# Patient Record
Sex: Male | Born: 1935 | Race: White | Hispanic: No | Marital: Married | Smoking: Former smoker
Health system: Southern US, Community
[De-identification: ages and names within clinical notes are randomized; demographics above are authoritative.]

## PROBLEM LIST (undated history)

## (undated) DIAGNOSIS — M2550 Pain in unspecified joint: Secondary | ICD-10-CM

## (undated) DIAGNOSIS — F418 Other specified anxiety disorders: Secondary | ICD-10-CM

## (undated) DIAGNOSIS — E559 Vitamin D deficiency, unspecified: Secondary | ICD-10-CM

## (undated) DIAGNOSIS — K5900 Constipation, unspecified: Secondary | ICD-10-CM

## (undated) DIAGNOSIS — G2581 Restless legs syndrome: Secondary | ICD-10-CM

## (undated) DIAGNOSIS — E785 Hyperlipidemia, unspecified: Secondary | ICD-10-CM

## (undated) DIAGNOSIS — J309 Allergic rhinitis, unspecified: Secondary | ICD-10-CM

## (undated) DIAGNOSIS — I219 Acute myocardial infarction, unspecified: Secondary | ICD-10-CM

## (undated) DIAGNOSIS — R413 Other amnesia: Secondary | ICD-10-CM

## (undated) DIAGNOSIS — I2510 Atherosclerotic heart disease of native coronary artery without angina pectoris: Secondary | ICD-10-CM

## (undated) DIAGNOSIS — K219 Gastro-esophageal reflux disease without esophagitis: Secondary | ICD-10-CM

## (undated) DIAGNOSIS — M1990 Unspecified osteoarthritis, unspecified site: Secondary | ICD-10-CM

## (undated) DIAGNOSIS — E538 Deficiency of other specified B group vitamins: Secondary | ICD-10-CM

## (undated) DIAGNOSIS — Z8709 Personal history of other diseases of the respiratory system: Secondary | ICD-10-CM

## (undated) DIAGNOSIS — I10 Essential (primary) hypertension: Secondary | ICD-10-CM

## (undated) HISTORY — PX: NASAL SINUS SURGERY: SHX719

## (undated) HISTORY — DX: Other amnesia: R41.3

## (undated) HISTORY — DX: Vitamin D deficiency, unspecified: E55.9

## (undated) HISTORY — PX: CORONARY ANGIOPLASTY: SHX604

## (undated) HISTORY — DX: Atherosclerotic heart disease of native coronary artery without angina pectoris: I25.10

## (undated) HISTORY — DX: Essential (primary) hypertension: I10

## (undated) HISTORY — DX: Acute myocardial infarction, unspecified: I21.9

## (undated) HISTORY — PX: HAND SURGERY: SHX662

## (undated) HISTORY — DX: Restless legs syndrome: G25.81

## (undated) HISTORY — PX: ESOPHAGOGASTRODUODENOSCOPY: SHX1529

## (undated) HISTORY — DX: Deficiency of other specified B group vitamins: E53.8

## (undated) HISTORY — PX: HERNIA REPAIR: SHX51

## (undated) HISTORY — DX: Allergic rhinitis, unspecified: J30.9

## (undated) HISTORY — DX: Other specified anxiety disorders: F41.8

## (undated) HISTORY — PX: COLONOSCOPY: SHX174

## (undated) HISTORY — PX: OTHER SURGICAL HISTORY: SHX169

---

## 2002-02-05 HISTORY — PX: CARDIAC CATHETERIZATION: SHX172

## 2002-11-03 ENCOUNTER — Encounter: Payer: Self-pay | Admitting: Emergency Medicine

## 2002-11-03 ENCOUNTER — Inpatient Hospital Stay (HOSPITAL_COMMUNITY): Admission: EM | Admit: 2002-11-03 | Discharge: 2002-11-05 | Payer: Self-pay | Admitting: Emergency Medicine

## 2003-04-27 ENCOUNTER — Emergency Department (HOSPITAL_COMMUNITY): Admission: EM | Admit: 2003-04-27 | Discharge: 2003-04-27 | Payer: Self-pay | Admitting: Emergency Medicine

## 2003-09-02 ENCOUNTER — Ambulatory Visit (HOSPITAL_BASED_OUTPATIENT_CLINIC_OR_DEPARTMENT_OTHER): Admission: RE | Admit: 2003-09-02 | Discharge: 2003-09-02 | Payer: Self-pay | Admitting: Orthopedic Surgery

## 2003-09-02 ENCOUNTER — Encounter: Admission: RE | Admit: 2003-09-02 | Discharge: 2003-09-02 | Payer: Self-pay | Admitting: Orthopedic Surgery

## 2003-09-02 ENCOUNTER — Ambulatory Visit (HOSPITAL_COMMUNITY): Admission: RE | Admit: 2003-09-02 | Discharge: 2003-09-02 | Payer: Self-pay | Admitting: Orthopedic Surgery

## 2004-04-25 ENCOUNTER — Encounter: Admission: RE | Admit: 2004-04-25 | Discharge: 2004-04-25 | Payer: Self-pay | Admitting: Family Medicine

## 2004-10-19 ENCOUNTER — Ambulatory Visit (HOSPITAL_BASED_OUTPATIENT_CLINIC_OR_DEPARTMENT_OTHER): Admission: RE | Admit: 2004-10-19 | Discharge: 2004-10-19 | Payer: Self-pay | Admitting: Orthopedic Surgery

## 2004-11-07 ENCOUNTER — Encounter: Admission: RE | Admit: 2004-11-07 | Discharge: 2004-11-07 | Payer: Self-pay | Admitting: Family Medicine

## 2005-05-29 ENCOUNTER — Ambulatory Visit (HOSPITAL_COMMUNITY): Admission: RE | Admit: 2005-05-29 | Discharge: 2005-05-29 | Payer: Self-pay | Admitting: Cardiology

## 2005-08-01 ENCOUNTER — Ambulatory Visit (HOSPITAL_COMMUNITY): Admission: RE | Admit: 2005-08-01 | Discharge: 2005-08-01 | Payer: Self-pay | Admitting: Cardiology

## 2005-09-04 ENCOUNTER — Ambulatory Visit: Payer: Self-pay | Admitting: Pulmonary Disease

## 2005-09-11 ENCOUNTER — Ambulatory Visit: Admission: RE | Admit: 2005-09-11 | Discharge: 2005-09-11 | Payer: Self-pay | Admitting: Pulmonary Disease

## 2005-10-31 ENCOUNTER — Ambulatory Visit: Payer: Self-pay | Admitting: Pulmonary Disease

## 2005-12-06 ENCOUNTER — Ambulatory Visit: Payer: Self-pay | Admitting: Pulmonary Disease

## 2006-01-07 ENCOUNTER — Ambulatory Visit: Payer: Self-pay | Admitting: Pulmonary Disease

## 2006-07-10 ENCOUNTER — Ambulatory Visit: Payer: Self-pay | Admitting: Pulmonary Disease

## 2006-09-04 ENCOUNTER — Ambulatory Visit: Payer: Self-pay | Admitting: Internal Medicine

## 2006-09-05 ENCOUNTER — Ambulatory Visit: Payer: Self-pay | Admitting: Internal Medicine

## 2006-09-30 ENCOUNTER — Ambulatory Visit: Payer: Self-pay | Admitting: Internal Medicine

## 2007-01-13 DIAGNOSIS — R0602 Shortness of breath: Secondary | ICD-10-CM

## 2007-01-13 DIAGNOSIS — J449 Chronic obstructive pulmonary disease, unspecified: Secondary | ICD-10-CM

## 2007-01-13 DIAGNOSIS — J310 Chronic rhinitis: Secondary | ICD-10-CM

## 2007-01-13 DIAGNOSIS — K219 Gastro-esophageal reflux disease without esophagitis: Secondary | ICD-10-CM

## 2007-01-13 DIAGNOSIS — I2510 Atherosclerotic heart disease of native coronary artery without angina pectoris: Secondary | ICD-10-CM

## 2007-01-14 ENCOUNTER — Ambulatory Visit: Payer: Self-pay | Admitting: Internal Medicine

## 2007-01-14 DIAGNOSIS — R05 Cough: Secondary | ICD-10-CM

## 2007-03-18 ENCOUNTER — Encounter: Payer: Self-pay | Admitting: Internal Medicine

## 2007-03-21 ENCOUNTER — Ambulatory Visit: Payer: Self-pay | Admitting: Internal Medicine

## 2007-03-21 DIAGNOSIS — J309 Allergic rhinitis, unspecified: Secondary | ICD-10-CM | POA: Insufficient documentation

## 2007-03-21 DIAGNOSIS — I214 Non-ST elevation (NSTEMI) myocardial infarction: Secondary | ICD-10-CM

## 2007-03-21 DIAGNOSIS — E785 Hyperlipidemia, unspecified: Secondary | ICD-10-CM

## 2009-06-15 ENCOUNTER — Inpatient Hospital Stay (HOSPITAL_COMMUNITY): Admission: EM | Admit: 2009-06-15 | Discharge: 2009-06-21 | Payer: Self-pay | Admitting: Emergency Medicine

## 2009-06-18 ENCOUNTER — Ambulatory Visit: Payer: Self-pay | Admitting: Infectious Diseases

## 2009-06-21 ENCOUNTER — Encounter: Payer: Self-pay | Admitting: Infectious Disease

## 2009-06-23 ENCOUNTER — Encounter: Payer: Self-pay | Admitting: Infectious Diseases

## 2009-06-29 ENCOUNTER — Encounter: Payer: Self-pay | Admitting: Infectious Disease

## 2009-07-05 ENCOUNTER — Ambulatory Visit: Payer: Self-pay | Admitting: Infectious Diseases

## 2009-07-05 DIAGNOSIS — IMO0002 Reserved for concepts with insufficient information to code with codable children: Secondary | ICD-10-CM

## 2009-07-07 ENCOUNTER — Telehealth: Payer: Self-pay | Admitting: Infectious Diseases

## 2010-03-07 NOTE — Miscellaneous (Signed)
Summary: HIPAA Restrictions  HIPAA Restrictions   Imported By: Florinda Marker 07/06/2009 09:55:54  _____________________________________________________________________  External Attachment:    Type:   Image     Comment:   External Document

## 2010-03-07 NOTE — Assessment & Plan Note (Signed)
Summary: hsfu cat bite/need chart/kam   Vital Signs:  Patient profile:   77 year old male Height:      69 inches (175.26 cm) Weight:      180.8 pounds (82.18 kg) BMI:     26.80 Temp:     97.8 degrees F (36.56 degrees C) oral Pulse rate:   86 / minute BP sitting:   132 / 88  (right arm)  Vitals Entered By: Baxter Hire) (Jul 05, 2009 11:13 AM) CC: hsfu /  cat bite Pain Assessment Patient in pain? no      Nutritional Status BMI of 25 - 29 = overweight Nutritional Status Detail appetite is so-so per patient  Does patient need assistance? Functional Status Self care Ambulation Normal   Primary Provider:  [facility]  CC:  hsfu /  cat bite.  History of Present Illness: 43 yo wtih cat bite leading to aggresssive infection s/p I and D 5/13 by Dr Merlyn Lot.  Cxs negative and treated with zosyn and doxy since d/c 5/16.  Here for follow up. Doing well with wound healing with3x week hydrotherapy. no fevers chills.   Consult note This is a very pleasant 77 year old white   male with a history of coronary artery disease, hypertension, multiple   hand surgeries for Dupuytren contractures who was admitted on May 11th   one day following up bite by his cat Figaro to his right thumb.  He says   he was walking down the stairs with his cat when there was a feral cat   on the stairs.  The cats started to fight and he picked up Figaro, and   Figaro bit him inadvertently on his right thumb.  He applied Neosporin   and wrapped it for the night however by the next day there was   increasing redness streaking up his arm and increasing pain and   drainage.  He presented to the emergency room and was admitted to the   [facility].  He was seen by Dr. Merlyn Lot and was taken to the   OR May 12th where he had findings of tenosynovitis with purulence noted   from multiple sites.  He had extensive debridement and cultures were   sent and are negative to date.  He has had negative  blood cultures.  He   has been started on vancomycin and Unasyn.  However, he has a decreasing   white count and decreasing platelets so therefore I am concerned about   drug reaction.  He also has been persistently febrile to 102.9 despite   antibiotics.      Preventive Screening-Counseling & Management  Alcohol-Tobacco     Alcohol drinks/day: 0     Smoking Status: quit     Year Quit: 1966     Pack years: 15 years 4 packs daily  Caffeine-Diet-Exercise     Caffeine use/day: coffee     Does Patient Exercise: no  Safety-Violence-Falls     Seat Belt Use: yes  Problems Prior to Update: 1)  Hx of Myocardial Infarction  (ICD-410.90) 2)  Hyperlipidemia  (ICD-272.4) 3)  G E R D  (ICD-530.81) 4)  Coronary Heart Disease  (ICD-414.00) 5)  Allergic Rhinitis  (ICD-477.9) 6)  Cough  (ICD-786.2) 7)  Copd, Mild  (ICD-496) 8)  Hx of Potential Lung Nodule  () 9)  Hx of Decreased Diffusing Capacity  () 10)  Coronary Artery Disease  (ICD-414.00) 11)  Esophageal Reflux  (ICD-530.81) 12)  Chronic Rhinitis  (ICD-472.0) 13)  Dyspnea  (ICD-786.05)  Medications Prior to Update: 1)  Cozaar 50 Mg  Tabs (Losartan Potassium) .... Take 1 By Mouth Once Daily 2)  Protonix 40 Mg  Tbec (Pantoprazole Sodium) .... Take 1 By Mouth Two Times A Day 3)  Celebrex 200 Mg  Caps (Celecoxib) .... As Needed  Allergies (verified): No Known Drug Allergies  Past History:  Past Medical History: Last updated: 03/21/2007 COPD Allergic Rhinitis Coronary Heart Disease/ stent G E R D Hyperlipidemia Myocardial Infarction  Social History: Last updated: 03/21/2007 Patient states former smoker.   Risk Factors: Alcohol Use: 0 (07/05/2009) Caffeine Use: coffee (07/05/2009) Exercise: no (07/05/2009)  Risk Factors: Smoking Status: quit (07/05/2009)  Review of Systems       11 systems reviewed and negative except per HPI   Physical Exam  General:  alert and well-developed.   Eyes:  vision grossly intact  and pupils equal.   Mouth:  fair dentition.   Neck:  supple.   Lungs:  normal respiratory effort and normal breath sounds.   Heart:  normal rate and regular rhythm.   Abdomen:  soft, non-tender, and normal bowel sounds.   Msk:  normal ROM and no joint tenderness.   Extremities:  r upper extremity wiht much healed thumb with some peeling skin.  at his wrist on volar aspect the open incision has steristrips in place but no drainage or pus.    Neurologic:  alert & oriented X3 and cranial nerves II-XII intact.   Skin:  no rashes.  picc LUE Cervical Nodes:  no anterior cervical adenopathy and no posterior cervical adenopathy.   Psych:  Oriented X3 and memory intact for recent and remote.     Impression & Recommendations:  Problem # 1:  CELLULITIS AND ABSCESS OF UPPER ARM AND FOREARM (ICD-682.3) S/p surg by Dr Merlyn Lot. doing great.   will d/c zosyn and doxy and continue on augmentin for 1 more week.  Check esr and crp. If his wound is still open will continue another week.   He will continue hydrotherapy and follow with Dr Merlyn Lot  pcp Dr Thea Silversmith  His updated medication list for this problem includes:    Doxycycline Hyclate 100 Mg Tabs (Doxycycline hyclate) .[name]... Take one tablet by mouth twice a day.    Augmentin 875-125 Mg Tabs (Amoxicillin-pot clavulanate) ..... One by mouth two times a day  Orders: T-C-Reactive Protein [phone]) T-Sed Rate (Automated) [phone]) Est. Patient Level IV (30865)  Problem # 2:  CAT BITE (ICD-E906.3)  Orders: T-C-Reactive Protein ([id number]) T-Sed Rate (Automated) (84132-44010) Est. Patient Level IV (27253)  Medications Added to Medication List This Visit: 1)  Doxycycline Hyclate 100 Mg Tabs (Doxycycline hyclate) .... Take one tablet by mouth twice a day. 2)  Augmentin 875-125 Mg Tabs (Amoxicillin-pot clavulanate) .... One by mouth two times a day  Patient Instructions: 1)  Note to [facility] - Stop zosyn and pull picc after first  checking CRP and ESR.   2)  Stop doxycycline and start augmening 875 two times a day for 1-2 weeks (depending on how the wound).   3)  Call to be seen if worsening infection.  Prescriptions: AUGMENTIN 875-125 MG TABS (AMOXICILLIN-POT CLAVULANATE) one by mouth two times a day  #28 x 0   Entered and Authorized by:   Clydie Braun MD   Signed by:   Clydie Braun MD on 07/05/2009   Method used:   Electronically to  [facility] [phone]* (retail)       [address] [place], [place]  [address]       Ph: [phone]       Fax: [phone]   RxID:   [id number]

## 2010-03-07 NOTE — Letter (Signed)
Summary: [employer] Medicare Complete: RX  [employer] Medicare Complete: RX   Imported By: Florinda Marker 07/20/2009 15:19:26  _____________________________________________________________________  External Attachment:    Type:   Image     Comment:   External Document

## 2010-03-07 NOTE — Medication Information (Signed)
Summary: [facility]: Medications  [facility]: Medications   Imported By: Florinda Marker 07/01/2009 16:19:01  _____________________________________________________________________  External Attachment:    Type:   Image     Comment:   External Document

## 2010-03-07 NOTE — Progress Notes (Signed)
Summary[facility] Inst[facility]  [facility] Inst[facility]   Imported By: Florinda Marker 07/12/2009 10:03:52  _____________________________________________________________________  External Attachment:    Type:   Image     Comment:   External Document

## 2010-03-07 NOTE — Progress Notes (Signed)
Summary: plan care oversight  Phone Note Other Incoming   Summary of Call: [phone] mins) [phone] (30 or more mins) [phone](> 60 mins) I have supervised home care and/or infusion therapy for this pt, including providing orders for care, review of labs and/or home health care plans, communicating with the home health care professionals and/or patient/caregivers to integrate current information into the medical treatment plan and/or adjust the medical therapy. This supervision has been provided for 31 minutes during the calendar month. Dates for this oversight 5.25-6/30   Initial call taken by: Clydie Braun MD,  July 07, 2009 8:42 AM

## 2010-04-24 LAB — CBC
HCT: 30 % — ABNORMAL LOW (ref 39.0–52.0)
HCT: 34.9 % — ABNORMAL LOW (ref 39.0–52.0)
Hemoglobin: 10.5 g/dL — ABNORMAL LOW (ref 13.0–17.0)
Hemoglobin: 12 g/dL — ABNORMAL LOW (ref 13.0–17.0)
MCHC: 34.3 g/dL (ref 30.0–36.0)
MCHC: 34.9 g/dL (ref 30.0–36.0)
MCV: 100.4 fL — ABNORMAL HIGH (ref 78.0–100.0)
MCV: 99.9 fL (ref 78.0–100.0)
Platelets: 68 10*3/uL — ABNORMAL LOW (ref 150–400)
Platelets: 70 10*3/uL — ABNORMAL LOW (ref 150–400)
RBC: 3 MIL/uL — ABNORMAL LOW (ref 4.22–5.81)
RBC: 3.48 MIL/uL — ABNORMAL LOW (ref 4.22–5.81)
RDW: 13.3 % (ref 11.5–15.5)
RDW: 13.5 % (ref 11.5–15.5)
WBC: 4.3 10*3/uL (ref 4.0–10.5)
WBC: 4.7 10*3/uL (ref 4.0–10.5)

## 2010-04-24 LAB — BASIC METABOLIC PANEL
BUN: 8 mg/dL (ref 6–23)
BUN: 9 mg/dL (ref 6–23)
CO2: 23 mEq/L (ref 19–32)
CO2: 25 mEq/L (ref 19–32)
Calcium: 8.2 mg/dL — ABNORMAL LOW (ref 8.4–10.5)
Calcium: 8.6 mg/dL (ref 8.4–10.5)
Chloride: 105 mEq/L (ref 96–112)
Chloride: 106 mEq/L (ref 96–112)
Creatinine, Ser: 1.18 mg/dL (ref 0.4–1.5)
Creatinine, Ser: 1.18 mg/dL (ref 0.4–1.5)
GFR calc Af Amer: >60 mL/min (ref >60)
GFR calc Af Amer: >60 mL/min (ref >60)
GFR calc non Af Amer: >60 mL/min (ref >60)
GFR calc non Af Amer: >60 mL/min (ref >60)
Glucose, Bld: 106 mg/dL — ABNORMAL HIGH (ref 70–99)
Glucose, Bld: 130 mg/dL — ABNORMAL HIGH (ref 70–99)
Potassium: 3.1 mEq/L — ABNORMAL LOW (ref 3.5–5.1)
Potassium: 3.3 mEq/L — ABNORMAL LOW (ref 3.5–5.1)
Sodium: 133 mEq/L — ABNORMAL LOW (ref 135–145)
Sodium: 137 mEq/L (ref 135–145)

## 2010-04-24 LAB — DIFFERENTIAL
Basophils Absolute: 0 10*3/uL (ref 0.0–0.1)
Basophils Relative: 0 % (ref 0–1)
Eosinophils Absolute: 0.1 10*3/uL (ref 0.0–0.7)
Eosinophils Relative: 1 % (ref 0–5)
Lymphocytes Relative: 13 % (ref 12–46)
Lymphs Abs: 0.6 10*3/uL — ABNORMAL LOW (ref 0.7–4.0)
Monocytes Absolute: 0.3 10*3/uL (ref 0.1–1.0)
Monocytes Relative: 6 % (ref 3–12)
Neutro Abs: 3.3 10*3/uL (ref 1.7–7.7)
Neutrophils Relative %: 79 % — ABNORMAL HIGH (ref 43–77)

## 2010-04-24 LAB — COMPREHENSIVE METABOLIC PANEL
ALT: 30 U/L (ref 0–53)
AST: 47 U/L — ABNORMAL HIGH (ref 0–37)
Albumin: 2.8 g/dL — ABNORMAL LOW (ref 3.5–5.2)
Alkaline Phosphatase: 71 U/L (ref 39–117)
BUN: 9 mg/dL (ref 6–23)
CO2: 23 mEq/L (ref 19–32)
Calcium: 8.5 mg/dL (ref 8.4–10.5)
Chloride: 103 mEq/L (ref 96–112)
Creatinine, Ser: 1.12 mg/dL (ref 0.4–1.5)
GFR calc Af Amer: >60 mL/min (ref >60)
GFR calc non Af Amer: >60 mL/min (ref >60)
Glucose, Bld: 110 mg/dL — ABNORMAL HIGH (ref 70–99)
Potassium: 3.3 mEq/L — ABNORMAL LOW (ref 3.5–5.1)
Sodium: 136 mEq/L (ref 135–145)
Total Bilirubin: 1.5 mg/dL — ABNORMAL HIGH (ref 0.3–1.2)
Total Protein: 6 g/dL (ref 6.0–8.3)

## 2010-04-24 LAB — C-REACTIVE PROTEIN: CRP: 27.7 mg/dL — ABNORMAL HIGH (ref <0.6)

## 2010-04-24 LAB — SEDIMENTATION RATE: Sed Rate: 38 mm/hr — ABNORMAL HIGH (ref 0–16)

## 2010-04-25 LAB — COMPREHENSIVE METABOLIC PANEL
AST: 24 U/L (ref 0–37)
BUN: 19 mg/dL (ref 6–23)
CO2: 23 mEq/L (ref 19–32)
Calcium: 8.2 mg/dL — ABNORMAL LOW (ref 8.4–10.5)
Chloride: 109 mEq/L (ref 96–112)
Creatinine, Ser: 1.29 mg/dL (ref 0.4–1.5)
GFR calc Af Amer: >60 mL/min (ref >60)
GFR calc non Af Amer: 54 mL/min — ABNORMAL LOW (ref >60)
Total Bilirubin: 1.6 mg/dL — ABNORMAL HIGH (ref 0.3–1.2)

## 2010-04-25 LAB — CBC
HCT: 31 % — ABNORMAL LOW (ref 39.0–52.0)
HCT: 34.1 % — ABNORMAL LOW (ref 39.0–52.0)
HCT: 36 % — ABNORMAL LOW (ref 39.0–52.0)
HCT: 40.5 % (ref 39.0–52.0)
Hemoglobin: 11.9 g/dL — ABNORMAL LOW (ref 13.0–17.0)
Hemoglobin: 12.6 g/dL — ABNORMAL LOW (ref 13.0–17.0)
Hemoglobin: 14.4 g/dL (ref 13.0–17.0)
MCHC: 35.1 g/dL (ref 30.0–36.0)
MCHC: 35.4 g/dL (ref 30.0–36.0)
MCV: 100.4 fL — ABNORMAL HIGH (ref 78.0–100.0)
MCV: 101.1 fL — ABNORMAL HIGH (ref 78.0–100.0)
MCV: 101.5 fL — ABNORMAL HIGH (ref 78.0–100.0)
MCV: 99.3 fL (ref 78.0–100.0)
Platelets: 66 10*3/uL — ABNORMAL LOW (ref 150–400)
Platelets: 90 10*3/uL — ABNORMAL LOW (ref 150–400)
Platelets: 97 10*3/uL — ABNORMAL LOW (ref 150–400)
RBC: 3.55 MIL/uL — ABNORMAL LOW (ref 4.22–5.81)
RBC: 4.03 MIL/uL — ABNORMAL LOW (ref 4.22–5.81)
RDW: 13.4 % (ref 11.5–15.5)
RDW: 13.5 % (ref 11.5–15.5)
RDW: 13.6 % (ref 11.5–15.5)
RDW: 13.8 % (ref 11.5–15.5)
WBC: 8.4 10*3/uL (ref 4.0–10.5)
WBC: 9.7 10*3/uL (ref 4.0–10.5)

## 2010-04-25 LAB — POCT I-STAT, CHEM 8
BUN: 19 mg/dL (ref 6–23)
Calcium, Ion: 1.17 mmol/L (ref 1.12–1.32)
Chloride: 107 mEq/L (ref 96–112)
Creatinine, Ser: 1.3 mg/dL (ref 0.4–1.5)
Glucose, Bld: 96 mg/dL (ref 70–99)
HCT: 43 % (ref 39.0–52.0)
Hemoglobin: 14.6 g/dL (ref 13.0–17.0)
Potassium: 3.5 mEq/L (ref 3.5–5.1)
Sodium: 139 mEq/L (ref 135–145)
TCO2: 21 mmol/L (ref 0–100)

## 2010-04-25 LAB — HAPTOGLOBIN: Haptoglobin: 56 mg/dL (ref 16–200)

## 2010-04-25 LAB — URINALYSIS, ROUTINE W REFLEX MICROSCOPIC
Glucose, UA: NEGATIVE mg/dL
Ketones, ur: 15 mg/dL — AB
Nitrite: NEGATIVE
Specific Gravity, Urine: 1.029 (ref 1.005–1.030)
pH: 5.5 (ref 5.0–8.0)

## 2010-04-25 LAB — CULTURE, BLOOD (ROUTINE X 2)
Culture: NO GROWTH
Culture: NO GROWTH

## 2010-04-25 LAB — DIFFERENTIAL
Basophils Absolute: 0 10*3/uL (ref 0.0–0.1)
Basophils Absolute: 0 10*3/uL (ref 0.0–0.1)
Basophils Absolute: 0 10*3/uL (ref 0.0–0.1)
Basophils Relative: 0 % (ref 0–1)
Basophils Relative: 0 % (ref 0–1)
Eosinophils Absolute: 0 10*3/uL (ref 0.0–0.7)
Eosinophils Absolute: 0 10*3/uL (ref 0.0–0.7)
Eosinophils Relative: 0 % (ref 0–5)
Eosinophils Relative: 0 % (ref 0–5)
Eosinophils Relative: 0 % (ref 0–5)
Lymphocytes Relative: 4 % — ABNORMAL LOW (ref 12–46)
Lymphocytes Relative: 9 % — ABNORMAL LOW (ref 12–46)
Lymphs Abs: 0.3 10*3/uL — ABNORMAL LOW (ref 0.7–4.0)
Lymphs Abs: 0.9 10*3/uL (ref 0.7–4.0)
Monocytes Absolute: 0.1 10*3/uL (ref 0.1–1.0)
Monocytes Absolute: 0.2 10*3/uL (ref 0.1–1.0)
Monocytes Relative: 1 % — ABNORMAL LOW (ref 3–12)
Neutro Abs: 8 10*3/uL — ABNORMAL HIGH (ref 1.7–7.7)
Neutro Abs: 8.1 10*3/uL — ABNORMAL HIGH (ref 1.7–7.7)
Neutrophils Relative %: 95 % — ABNORMAL HIGH (ref 43–77)

## 2010-04-25 LAB — LACTATE DEHYDROGENASE: LDH: 164 U/L (ref 94–250)

## 2010-04-25 LAB — BASIC METABOLIC PANEL
BUN: 10 mg/dL (ref 6–23)
BUN: 15 mg/dL (ref 6–23)
Chloride: 104 mEq/L (ref 96–112)
Chloride: 107 mEq/L (ref 96–112)
Creatinine, Ser: 1.27 mg/dL (ref 0.4–1.5)
GFR calc non Af Amer: 45 mL/min — ABNORMAL LOW (ref >60)
Glucose, Bld: 118 mg/dL — ABNORMAL HIGH (ref 70–99)
Glucose, Bld: 148 mg/dL — ABNORMAL HIGH (ref 70–99)
Potassium: 3.4 mEq/L — ABNORMAL LOW (ref 3.5–5.1)
Potassium: 3.7 mEq/L (ref 3.5–5.1)
Sodium: 138 mEq/L (ref 135–145)

## 2010-04-25 LAB — AFB CULTURE WITH SMEAR (NOT AT ARMC): Acid Fast Smear: NONE SEEN

## 2010-04-25 LAB — ANAEROBIC CULTURE

## 2010-04-25 LAB — CULTURE, ROUTINE-ABSCESS: Culture: NO GROWTH

## 2010-04-25 LAB — TECHNOLOGIST SMEAR REVIEW

## 2010-04-25 LAB — MAGNESIUM: Magnesium: 1.6 mg/dL (ref 1.5–2.5)

## 2010-04-25 LAB — GRAM STAIN

## 2010-04-25 LAB — VANCOMYCIN, TROUGH: Vancomycin Tr: 6.2 ug/mL — ABNORMAL LOW (ref 10.0–20.0)

## 2010-05-16 ENCOUNTER — Encounter: Payer: Self-pay | Admitting: *Deleted

## 2010-06-20 NOTE — Assessment & Plan Note (Signed)
[facility]                             PULMONARY OFFICE NOTE   NAME:Zapanta, CATON POPOWSKI                       MRN:          [id number]  DATE:09/04/2006                            DOB:          1931-03-22    PROBLEMS:  1. Dyspnea.  2. Chronic rhinitis.  3. Esophageal reflux.  4. Coronary disease/infarction/stent.   HISTORY:  This patient is new to me, having previously been followed by  Dr. Jayme Cloud until she left the practice.  She had wanted me to attend  to his chronic nasal allergy complaints.  He is depressed after a death  in the family and cancelled a sleep study which had been scheduled for  him.  QVAR has been of some help but seems to increase the amount of  mucus.  He had a cardiopulmonary exercise test.  He understands that it  was nearly normal.  Pulmonary function testing in August of last year  had shown mild obstruction and small airway flows, normal lung volumes,  and normal diffuse capacity.  He notices exertional dyspnea climbing  stairs but admits he gets very little exercise.  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  This would be too recent for any significant asbestosis  effect directly.  He was noted to be anemic when he went for blood  donation but had a negative workup for any GI blood loss and was put on  iron.  He reports Symbicort and Spiriva as unhelpful.   MEDICATIONS:  1. Cozaar 50 mg.  2. Lipitor 20 mg.  3. Protonix 40 mg b.i.d.  4. QVAR 80 2 puffs b.i.d.  5. Celebrex p.r.n.   OBJECTIVE:  Weight 193 pounds.  BP 116/78.  Pulse 71, room air  saturation 97%.  He has no neck vein distention or peripheral edema.  No  cyanosis or clubbing.  No adenopathy that I find.  No stridor.  Mild  nasal congestion without visible polyps, mucus bridging or inflammation  with no postnasal drainage.  His pharynx is not irritated.  Chest sounds  clear.  Heart sounds are  regular without murmur.   IMPRESSION:  1. Dyspnea, considered multifactorial.  The pulmonary component seems      modest, and I suspect deconditioning and perhaps his cardiac      history are more important.  We do need to make sure that he does      not become progressively anemic.  2. History of allergic rhinitis.   PLAN:  1. We are going to repeat pulmonary functions, now a year later to      correlate with current complaints.  2. Chest x-ray with question of a right upper lobe nodular density      seen a year ago.  3. Schedule return in two months, earlier p.r.n.     Clinton D. Maple Hudson, MD, Tonny Bollman, FACP  Electronically Signed    CDY/MedQ  DD: 09/08/2006  DT: 09/08/2006  Job #: [id number]   cc:   Francisca December, M.D.  [facility]

## 2010-06-20 NOTE — Assessment & Plan Note (Signed)
[facility]                             PULMONARY OFFICE NOTE   NAME:[name], [name]                       MRN:          [id number]  DATE:07/10/2006                            DOB:          [birth year]    This is a very pleasant 77 year old white male who follows here for  dyspnea, mostly on activity.  The patient has been studied extensively.  He has had cardiopulmonary stress testing done in August, 2007 that  showed relatively normal cardiopulmonary stress test with the exception  of decrease in the oxygen, pulse, suggesting decrease in stroke volume.  There is also borderline obstruction and increased residual volume on  pulmonary function testing noted, correlated with testing.  The patient  also has a history of chronic nasal allergies, has a history of  gastroesophageal reflux, and a history of decreased diffusing capacity.   The patient has been recommended to have a sleep study; however, he has  declined this and has cancelled his appointments.  The patient has been  tried on several inhalers, to include Symbicort which caused him cramps,  but he felt was useful, and also Spiriva.  This also the patient did not  feel was useful.  The patient also has noted that nasal inhalers are not  useful.  The patient has been off of all inhalers on his own accord,  since the first part of January.  He has been placed on twice daily  proton pump inhibitor by his gastroenterologist and feels somewhat  improved with this but not back to baseline.   CURRENT MEDICATIONS:  As noted on the intake sheet.  These have been  reviewed and are accurate.   PHYSICAL EXAMINATION:  VITAL SIGNS:  Noted.  Oxygen saturation is 95% on  room air.  GENERAL:  This is a well-developed and well-nourished gentleman who is  in no acute distress.  HEENT:  Unremarkable with the exception of the patient having some  hoarseness.  He also has rhinitis changes noted bilaterally.  NECK:  Supple.  No adenopathy noted.  No JVD.  LUNGS:  Clear to auscultation bilaterally.  CARDIAC:  Regular rate and rhythm.  No murmurs, rubs or gallops.  EXTREMITIES:  Patient has no cyanosis, no clubbing, no edema noted.   IMPRESSION:  1. Dyspnea, which is multifactorial.  The patient does have evidence      of mild obstructive disease with small airway component.  It would      be reasonable to get this treated to optimize his treatment.  He      did have a relatively normal cardiopulmonary stress test with      perhaps mild decrease in stroke volume but not really significant      to cause the patient's symptoms.  2. Chronic rhinitis.  3. Gastroesophageal reflux.   PLAN:  1. Patient is to be placed on a trial of QVAR 80 mcg 2 puffs twice      daily.  We will avoid beta agonist of either the short or long-      acting variety due to untoward  side effects of muscle cramping.  2. Patient has been informed that I will be leaving the practice;      therefore, we will have him see in consultation, Dr. Jetty Duhamel      for evaluation of allergies and for management of the same.  3. I urged the patient to continue his proton pump inhibitor as per      his gastroenterologist.  4. The patient should be seen in 2-3 months by Dr. Maple Hudson and upon      followup, chest x-ray should be done to follow up.  He has a      history of potential lung nodule, and this will warrant followup.     Gailen Shelter, MD  Electronically Signed    CLG/MedQ  DD: 07/10/2006  DT: 07/10/2006  Job #: [id number]   cc:   Francisca December, M.D.

## 2010-06-23 NOTE — Op Note (Signed)
NAMESAHIL, MILNER                ACCOUNT NO.:  [id number]   MEDICAL RECORD NO.:  [id number]          PATIENT TYPE:  OUT   LOCATION:  CARD                         FACILITY:  [facility]   PHYSICIAN:  Oley Balm. Sung Amabile, MD   DATE OF BIRTH:  1931-09-30   DATE OF PROCEDURE:  09/11/2005  DATE OF DISCHARGE:  09/11/2005                                 OPERATIVE REPORT   PROCEDURE:  Cardiopulmonary stress test.   INDICATION FOR TESTING:  Exertional dyspnea.   PROCEDURE:  Cardiopulmonary stress testing was performed on a graded  treadmill.  Testing was stopped due to dyspnea.  Effort was maximal.   At peak exercise, oxygen uptake was 23.2 mL/kg per minute or 111% of  predicted maximum indicating normal exercise tolerance.   At peak exercise, heart rate was 156 beats per minute or 105% of predicted  maximum indicating that cardiovascular limitation was reached.  Oxygen pulse  was low normal suggesting low normal stroke volume.  Blood pressure response  was normal.  EKG tracings revealed nonspecific ST and T-wave abnormalities.   At peak exercise, minute ventilation was 84.6 liters per minute or 90% of  maximum voluntary ventilation indicating that ventilatory limitation was  reached.  Gas exchange parameters revealed no abnormalities.  Baseline  pulmonary function tests revealed borderline obstruction with increased  residual volume.  Post exercise spirometry revealed no exercise induced  bronchospasm.   SUMMARY:  Essentially normal cardiopulmonary stress test except for  borderline decrease in oxygen pulse suggesting possible decrease in stroke  volume.  There is borderline obstruction and increased residual volume on  pulmonary function test.           ______________________________  Oley Balm. Sung Amabile, MD     DBS/MEDQ  D:  10/04/2005  T:  10/04/2005  Job:  [id number]   cc:   Gailen Shelter, MD  [address] [place], [place] [address]

## 2010-06-23 NOTE — Assessment & Plan Note (Signed)
[facility]                             PULMONARY OFFICE NOTE   NAME:Turpen, ICARUS PARTCH                       MRN:          [id number]  DATE:01/07/2006                            DOB:          [birth year]    Mr. Fetterman is a 77 year old gentleman who presents for followup on  dyspnea.  Nosson has been evaluated extensively for dyspnea.  He did  undergo a cardiopulmonary stress test in August of 2007 which showed  essentially a normal cardiopulmonary stress test with the exception of  borderline decrease in oxygen pulse suggesting decrease in stroke  volume, and also borderline obstruction and increased residual volume.  We have treated his COPD component with Spiriva and Symbicort.  He,  however, continues to complain bitterly of dyspnea.  He seems to have  symptoms out of proportion to his mild objective findings.  The patient,  also in the past, had difficulties with ACE inhibitor-induced cough, and  this has been relieved by discontinuing these medications.   Today, delving deeper into the dyspnea complaint, he does complain of  more fatigue, feeling tired during the day.  He actually has had  difficulties with sleep with frequent awakenings.   CURRENT MEDICATIONS:  As noted on the intake sheet.  These include  Lipitor, Cozaar, Spiriva, Nasonex and Symbicort.   PHYSICAL EXAMINATION:  VITALS:  As noted.  Oxygen saturation is 95% on  room air.  GENERAL:  This is a stocky-built gentleman who is in no acute distress.  He does appear somewhat depressed.  HEENT EXAMINATION:  Unremarkable.  NECK:  Supple.  No adenopathy noted.  No JVD.  LUNGS:  Clear to auscultation bilaterally.  CARDIAC EXAMINATION:  Regular rate and rhythm.  No rubs, murmurs or  gallops heard.  EXTREMITIES:  No cyanosis, no clubbing and no edema noted.   IMPRESSION:  1. Dyspnea.  Out of proportion to the patient's objective findings on      cardiopulmonary stress test.  2. Fatigue,  which is the main component of his number one complaint.      He does have difficulties with sleep, query sleep apnea.  3. Mild chronic obstructive pulmonary disease on Spiriva and      Symbicort.   PLAN:  1. Will be for the patient to undergo sleep study.  2. Followup will be in 6 to 8 weeks' time.  3. He is to continue medications as they are.  4. He is to contact us prior to his scheduled appointment should any      new problems arise.     Gailen Shelter, MD  Electronically Signed    CLG/MedQ  DD: 01/27/2006  DT: 01/27/2006  Job #: [id number]   cc:   Francisca December, M.D.

## 2010-06-23 NOTE — Op Note (Signed)
NAME:  [name], [name]                          ACCOUNT NO.:  [id number]   MEDICAL RECORD NO.:  [id number]                   PATIENT TYPE:  AMB   LOCATION:  DSC                                  FACILITY:  [facility]   PHYSICIAN:  Cindee Salt, M.D.                    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   POSTOPERATIVE DIAGNOSIS:  Status post crush injury with partial amputation  of left index finger.   PROCEDURE:  Removal of necrotic bone left index fingertip.   SURGEON:  Cindee Salt, M.D.   ASSISTANTCarolyne Fiscal.   ANESTHESIA:  Forearm based IV regional.   INDICATIONS FOR PROCEDURE:  The patient is [name] 77 year old male who suffered  crush injury to his left index finger. He underwent repair, however,  portions of the skin have not survived.  He has [name] portion of dead, necrotic  bone exposed.   DESCRIPTION OF PROCEDURE:  The patient is brought to the operating room  where [name] forearm based IV regional anesthetic was carried out without  difficulty.  He was prepped using Duraprep in the supine position with the  left arm free.  After adequate anesthesia was afforded to the patient, [name]  rongeur was then used to remove bone back to the level of his skin.  This  was then rongeured slightly beneath allowing partial closure.  Partial  closure was then performed without difficulty with interrupted 4-0 chromic  sutures after irrigation of the wound.  This left [name] small portion opened  which should heal in by secondary intention without difficulty.  [name] sterile  compressive dressing was applied.  The patient tolerated the procedure well  and was taken to the recovery room for observation in satisfactory  condition.  He is discharged home to return to the [facility]  in one week on Vicodin.                                         Cindee Salt, M.D.    [name]  D:  09/02/2003  T:  09/02/2003  Job:  [id number]

## 2010-06-23 NOTE — Discharge Summary (Signed)
[name], [name]                            ACCOUNT NO.:  [id number]   MEDICAL RECORD NO.:  [id number]                   PATIENT TYPE:  INP   LOCATION:  2908                                 FACILITY:  [facility]   PHYSICIAN:  Francisca December, M.D.               DATE OF BIRTH:  18-Jan-1932   DATE OF ADMISSION:  11/02/2002  DATE OF DISCHARGE:                                 DISCHARGE SUMMARY   CHIEF COMPLAINT:  Chest pain, probable acute myocardial infarction.   HISTORY OF PRESENT ILLNESS:  [name] is a 77 year old male patient with  no prior history of any cardiac disease.  The only history he has documented  is for gastroesophageal reflux disease and allergies.  He awoke at 2 a.m. on  the morning of admission complaining of chest pain, took an aspirin, the  pain decreased, he went back to sleep after waiting 30 minutes, woke up that  morning, and began working on some construction type activities outside of  his house.  He developed a midsternal chest pain again, and it felt like an  elephant was sitting on his chest.  He came inside to lay down.  His wife  noted the patient was extremely pale and diaphoretic, so she had EMS come to  the house and bring the patient to the emergency room.  In the emergency  room, the patient was found to be hypotensive and bradycardic.  An EKG was  positive for changes consistent with an acute inferior MI.  Dr. Amil Amen was  contacted, and the patient was taken emergently to the cath lab.   The patient underwent emergent left heart catheterization on 100% lesion  with clot in the proximal RCA with stent placement.  This was successful,  and the patient was subsequently transferred to the CCU for further  observation.   ADMITTING DIAGNOSES:  1. Chest pain secondary to acute inferior myocardial infarction with     thrombosis.  2. Known gastroesophageal reflux disease.   HOSPITAL COURSE:  #1.  Acute inferior myocardial infarction, status post  PTCA  of the RCA.  The patient was admitted to the CCU where he did extremely  well in the post-catheterization period.  His ACT's went down without any  problem, and he had no difficulties with bleeding or hematoma formation at  the catheterization site.  In reviewing the catheterization report, the  patient did experience some spontaneous ventricular fibrillation in the  catheterization lab during the procedure, and he was defibrillated twice  with no sequelae.  Ventriculogram during the catheterization also revealed  normal left ventricular function with 60% EF, some mild inferior  hypokinesis.  The left main was noted to be okay.  LAD had 40-50% stenosis,  diagonal one 50-60%, and left circumflex with three OM's with no significant  obstruction.  Again, the RCA was 100% obstructed from the proximal to mid  with thrombotic  material which was removed per Dr. Amil Amen in the  catheterization lab with an export catheterization, and subsequent stent was  placed.  The patient was started on Plavix in the immediate postoperative  period.  CK peak was 840.  He was started on beta blockers, aspirin, and  Plavix, as well as a low-dose ACE inhibitor.  Vital signs have been  remaining stable at 122/76, heart rate of 68, sinus rhythm.  Respirations  were 18.  He has not experienced any JVD or signs of volume overload, and  his chest has remained clear to auscultation.  He did experience some  difficulty with mild transient hypokalemia, and received potassium during  the hospitalization, but this will not be continued at discharge.   FINAL DISCHARGE DIAGNOSES:  1. Acute inferior myocardial infarction.  2. Status post emergent cardiac catheterization with percutaneous     transluminal coronary angioplasty and stent placement to the right     coronary artery.  Please refer to the conclusions and summary in the     cardiac catheterization report as dictated by Dr. Amil Amen.  3. Known gastroesophageal reflux  disease, stable.   DISCHARGE MEDICATIONS:  1. Toprol-XL 50 mg once daily.  2. Plavix 75 mg once daily.  3. Enteric-coated aspirin 325 mg once daily.  4. Altace 2.5 mg once daily.  5. Zocor 40 mg once daily in the evening time.  6. Nexium 40 mg daily.   DISCHARGE INSTRUCTIONS:  Activity per recommendations of cardiac  rehabilitation.  The patient was evaluated by cardiac rehabilitation on the  date of discharge and was found to be fully independent.  He will follow up  outpatient with a local [facility] cardiac rehabilitation facility.   DIET:  Low fat, less than 40 gm, and low cholesterol, less than 300 mg  daily.   WOUND CARE:  He may remove the bandage at the groin site after returning  home, and he may shower.   FOLLOW UP APPOINTMENTS:  He has an appointment scheduled to see Dr. Amil Amen  on December 03, 2002 at 11:30 a.m.  The patient has been instructed to arrive  at 11:15 a.m., since he is a new patient.      Allison L. Rolene Course                    Francisca December, M.D.    ALE/MEDQ  D:  11/05/2002  T:  11/06/2002  Job:  [id number]   cc:   Bryan Lemma. Manus Gunning, M.D.  [address] [place]  [place] [address]  Fax: [phone]

## 2010-06-23 NOTE — Cardiovascular Report (Signed)
[name], [name]                ACCOUNT NO.:  [id number]   MEDICAL RECORD NO.:  [id number]          PATIENT TYPE:  OIB   LOCATION:  2899                         FACILITY:  [facility]   PHYSICIAN:  Francisca December, M.D.  DATE OF BIRTH:  Mar 05, 1931   DATE OF PROCEDURE:  05/29/2005  DATE OF DISCHARGE:                              CARDIAC CATHETERIZATION   PROCEDURE PERFORMED:  1.  Left heart catheterization.  2.  Coronary angiography.  3.  Left ventriculogram.  4.  Intravascular ultrasound right coronary artery.   INDICATION:  [name] is a 77 year old man with known ASCVD.  He is  now 2-and-a-half years status post an inferior posterior wall MI treated  direct PTCA in the RCA with a bare-metal stent.  He has had recurrent chest  pain recently which sounds anginal although it is not occurring with  exercise.  He does have exertional dyspnea.  He underwent a myocardial  perfusion study which showed a reversible inferoseptal and inferobasal  defect.  His is brought to the cardiac catheterization laboratory at this  time to identify the extent of disease and provide for further therapeutic  options.   PROCEDURAL NOTE:  The patient is brought to the cardiac catheterization  laboratory in the fasting state.  The right groin was prepped and then  draped in the usual sterile fashion.  Local anesthesia was obtained with the  infiltration of 1% lidocaine.  A 5-French catheter sheath was inserted  percutaneously into the right femoral artery utilizing an anterior approach  over a guiding J wire.  A 110-cm pigtail catheter was used to measure  pressures in the ascending aorta and in the left ventricle both prior to and  following the ventriculogram.  A 30-degree RAO cine left ventriculogram was  performed utilizing a power injector; 39 mL were injected at 13 mL per  second.  Following the sublingual administration of 0.4 mg of nitroglycerin,  cineangiography of the left coronary artery  was conducted in multiple LAO  and RAO projections utilizing a 5-French #4 left Judkins catheter.  The 5-  [place] #4 left Judkins catheter was exchanged for a 5-French #4 right  Judkins catheter.  Due to a dilated aortic root and an unwound aorta, I  could not torque the catheter.  An attempt to use a no-torque catheter was  unsuccessful.  It was not long enough.  I finally cannulated the right  coronary artery with a guiding catheter, FR-4, supported by the 0.038 wire.  Cineangiography of this coronary artery was performed in LAO and RAO  projections.  There did appear to be a flow disturbance in the distal  coronary.  Therefore, the patient received 3500 units of heparin and a 0.014-  inch Luge wire was deployed into the artery.  The Volcano intravascular  ultrasound device was deployed and manual pull-back imaging obtained.  The  images were analyzed and the guidewire and guiding catheter were removed.  A  right femoral arteriogram in the 45-degree RAO angulation documented  adequate anatomy for placement of percutaneous closure device Angio-Seal.  This was successfully deployed  with good hemostasis.  An ACT obtained after  heparin administration was found to be 335 seconds.   HEMODYNAMICS:  Systemic arterial pressure was 121/79 with a mean of 98 mmHg.  There was no systolic gradient across the aortic valve.  The left  ventricular end-diastolic pressure was 11 mmHg.   ANGIOGRAPHY:  The left ventriculogram demonstrated normal chamber size and  normal global systolic function without regional wall motion abnormality.  A  visual estimate of the ejection fraction is 65-70%.  There are no regional  wall motion abnormalities.  The aortic valve is trileaflet and opens  normally during systole.  No coronary calcification is seen.  There is a  stent visible in the proximal right coronary.   There is a right-dominant coronary system present.  The main left coronary  artery is normal.   The  left anterior descending artery and its branches is ectatic in the  proximal portion and without significant obstruction.  There is a moderate  size first diagonal that arises without any obstruction.  The ongoing  anterior descending artery is diffusely diseased but without obstruction.  It does reach and barely traverse the apex.   The left circumflex coronary artery and its branches appear without  obstruction.  The vessel trifurcates about 20 mm from its origin and three  moderate size marginal branches arise.  No significant obstruction can be  identified in these branch vessels.   The right coronary artery and its branches appear widely patent and this  includes the stented segment.  In the distal portion there is what appears  to be a flow disturbance and perhaps a 30-50% stenosis.  The distal vessel  then bifurcates into a moderate size posterior descending artery and a  moderate size posterolateral segment and branch.  None of these distal  vessels have any obstruction.   INTRAVASCULAR ULTRASOUND:  This demonstrated a widely patent lumen with a  nonobstructive plaque.  The luminal dimension is 4.1 x 4.5 mm.   FINAL IMPRESSION:  1.  Atherosclerotic cardiovascular disease, single vessel.  2.  No evidence of significant obstructive coronary disease.  3.  Intact left ventricular size and global systolic function.   PLAN:  Medical therapy only is indicated.      Francisca December, M.D.  Electronically Signed     JHE/MEDQ  D:  05/29/2005  T:  05/30/2005  Job:  [id number]   cc:   Bryan Lemma. Manus Gunning, M.D.  Fax: [phone]

## 2010-06-23 NOTE — Op Note (Signed)
NAMEKEYMARION, BEARMAN                ACCOUNT NO.:  [id number]   MEDICAL RECORD NO.:  [id number]          PATIENT TYPE:  AMB   LOCATION:  DSC                          FACILITY:  [facility]   PHYSICIAN:  Cindee Salt, M.D.       DATE OF BIRTH:  19-Apr-1931   DATE OF PROCEDURE:  10/19/2004  DATE OF DISCHARGE:                                 OPERATIVE REPORT   PREOPERATIVE DIAGNOSIS:  Foreign body left middle finger.   POSTOPERATIVE DIAGNOSIS:  Foreign body left middle finger.   OPERATION:  Excision foreign body left middle finger.   SURGEON:  Cindee Salt, M.D.   ASSISTANT:  Carolyne Fiscal R.N.   ANESTHESIA:  Local.   HISTORY:  The patient is a 77-year-old male with a history of a foreign body  in his left middle finger. This a piece of glass, visible on x-ray.  He  desires removal, it has been present for approximately 2 months.   PROCEDURE:  The patient is brought to the operating room where a metacarpal  block was given with 1% Xylocaine without epinephrine; 4 mL was used.  A  Penrose drain was used for tourniquet control, after prep was done with  Betadine scrub and solution. The longitudinal incision was made directly  over the entrance wound, carried down through into the subcutaneous tissue.  Foreign material was immediately encountered. This was removed without  difficulty. X-rays revealed complete removal.  The wound was irrigated and  closed with interrupted 5-0 nylon sutures. A sterile compressive dressing  was applied. The patient tolerated the procedure well. He is discharged home  to return to the [facility] of [place] in 1 week, on Darvocet N 100.           ______________________________  Cindee Salt, M.D.     GK/MEDQ  D:  10/19/2004  T:  10/19/2004  Job:  [id number]

## 2010-06-23 NOTE — Op Note (Signed)
NAMEFRANKI, [name]                ACCOUNT NO.:  [id number]   MEDICAL RECORD NO.:  [id number]          PATIENT TYPE:  AMB   LOCATION:  DSC                          FACILITY:  [facility]   PHYSICIAN:  Cindee Salt, M.D.       DATE OF BIRTH:  1931-05-14   DATE OF PROCEDURE:  10/19/2004  DATE OF DISCHARGE:  10/19/2004                                 OPERATIVE REPORT   PREOPERATIVE DIAGNOSIS:  Foreign body left middle finger.   POSTOPERATIVE DIAGNOSIS:  Foreign body left middle finger.   OPERATION:  Removal foreign body left middle finger.   SURGEON:  Cindee Salt, M.D.   ANESTHESIA:  Local.   HISTORY:  The patient is a 78 year old male with a history of a piece of  glass in his left middle finger. He is admitted for removal.   PROCEDURE:  The patient is brought to the operating room and 1% Xylocaine  metacarpal block was given after he was prepped using DuraPrep.  A Penrose  drain was used for tourniquet control at the base of the left middle finger,  4 mL of Xylocaine was used. An oblique incision was made directly over the  entrance.  A shard of glass was immediately apparent.  With blunt and sharp  dissection, this was dissected free and removed.  The wound irrigated and  closed with interrupted 5-0 nylon sutures. Sterile compressive dressing was  applied. The patient tolerated the procedure well. He is discharged home to  return to the [facility] of [place] in one week on Vicodin.           ______________________________  Cindee Salt, M.D.     GK/MEDQ  D:  12/04/2004  T:  12/04/2004  Job:  [id number]   cc:   Bryan Lemma. Manus Gunning, M.D.  Fax: [phone]

## 2010-06-23 NOTE — Op Note (Signed)
NAMEVICKIE, PONDS                            ACCOUNT NO.:  [id number]   MEDICAL RECORD NO.:  [id number]                   PATIENT TYPE:  EMS   LOCATION:  MAJO                                 FACILITY:  [facility]   PHYSICIAN:  Cindee Salt, M.D.                    DATE OF BIRTH:  09-08-1931   DATE OF PROCEDURE:  04/27/2003  DATE OF DISCHARGE:  04/27/2003                                 OPERATIVE REPORT   PREOPERATIVE DIAGNOSIS:  Saw injury, left index finger.   POSTOPERATIVE DIAGNOSIS:  Saw injury, left index finger.   OPERATION:  Repair of open fracture, left index finger.   SURGEON:  Cindee Salt, M.D.   ANESTHESIA:  __________ block.   HISTORY:  The patient is a 77 year old male who suffered a saw injury  through the tip of his index finger, going distally out through the dorsal  portion of the distal phalanx proximal to the nailbed.  X-rays revealed a  fracture with loss of bone.   PROCEDURE:  The patient was given a __________ block with 2% Xylocaine  without epinephrine.  5 cc was used.  He was prepped using Betadine  solution.  A Penrose drain was used for tourniquet control at the base of  the finger.  The fracture was opened, irrigated, debrided.  This was  provisionally reduced.  There was no way to fix this due to the bone loss,  and he is aware of potential instability with the necessity of bone grafting  later or revision of the amputation.  The skin was then repaired with  interrupted 5-0 nylon sutures, fully closing the area, realigning the pulp.  A sterile compressive dressing and splint was applied.  The patient  tolerated the procedure well.  He is discharged home to return __________ in  one week, Percocet and Keflex.                                               Cindee Salt, M.D.    [name]  D:  04/27/2003  T:  04/28/2003  Job:  [id number]

## 2013-03-12 ENCOUNTER — Encounter (INDEPENDENT_AMBULATORY_CARE_PROVIDER_SITE_OTHER): Payer: Self-pay

## 2013-03-12 ENCOUNTER — Encounter: Payer: Self-pay | Admitting: Neurology

## 2013-03-12 ENCOUNTER — Ambulatory Visit (INDEPENDENT_AMBULATORY_CARE_PROVIDER_SITE_OTHER): Payer: Medicare Other | Admitting: Neurology

## 2013-03-12 VITALS — BP 134/88 | HR 81 | Ht 68.0 in | Wt 175.0 lb

## 2013-03-12 DIAGNOSIS — F419 Anxiety disorder, unspecified: Secondary | ICD-10-CM

## 2013-03-12 DIAGNOSIS — R413 Other amnesia: Secondary | ICD-10-CM

## 2013-03-12 DIAGNOSIS — F411 Generalized anxiety disorder: Secondary | ICD-10-CM

## 2013-03-12 DIAGNOSIS — G3184 Mild cognitive impairment, so stated: Secondary | ICD-10-CM

## 2013-03-12 NOTE — Patient Instructions (Addendum)
I had a long discussion the patient and his wife regarding his symptoms, discuss results of her evaluation, plan for further testing and answered questions. I recommend he try Cerefolin na.c. and fish oil daily for his mild cognitive impairment. I encouraged him to participate in cognitively challenging activities like solving crossword puzzles, sudoku and playing bridge. Check MRI scan of the brain with and without contrast and dementia panel labs. Return for followup in 2 months or earlier if necessary

## 2013-03-12 NOTE — Progress Notes (Signed)
[facility] [address] [place]. [place] [id number] [phone]       OFFICE CONSULT NOTE  [name] Date of Birth:  08/02/1931 Medical Record Number:  [id number]   Referring MD:  Shary DecampBrian Mckenzie  Reason for Referral:  Confusion and memory loss  HPI: [age] year Caucasian male who since spring of 2014 has had some intermittent confusion and memory difficulties as well as trouble driving. He has also had some balance difficulties and falls. Patient states he was under significant stress as he was going through a lawsuit involving her). Here started on Celexa 2 months ago by her primary physician but stopped it after a few weeks as he felt it did not help. Lawsuit fortunately got over last week. The patient feels that he is better now. This was also depressed which also seems to improve. Was withdrawn and not going or or participating in activities. He denies significant headache, seizure, loss of consciousness, focal weakness. He has not had any brain imaging studies done. There is no prior history of strokes, TIAs, seizures. Is no family history of dementia. He has not specifically tried medications for improvement of I for treatment for depression. He had mini mental of 21/30 and Dr. Zebedee IbaMackenzie's office last month but today he did better with 25/30 in our office.   ROS:   14 system review of systems is positive for weight loss, fatigue, the years, shortness of breath, cough, snoring, constipation, urination problems, easy bruising and bleeding, feeling cold, joint pain, aching muscles, allergies, memory loss, confusion, slurred speech, snoring, restless legs anxiety, depression, decreased energy and appetite and is interested in activities.  PMH:  Past Medical History  Diagnosis Date  . Heart attack   . Depression with anxiety   . Hypertension   . CRF (chronic renal failure)   . COPD (chronic obstructive pulmonary disease)   . RLS (restless legs syndrome)   .  Unspecified vitamin D deficiency   . Vitamin B 12 deficiency   . Allergic rhinitis     Social History:  History   Social History  . Marital Status: Married    Spouse Name: N/A    Number of Children: 4  . Years of Education: 12th   Occupational History  . retired    Social History Main Topics  . Smoking status: Former Games developermoker  . Smokeless tobacco: Not on file  . Alcohol Use: Yes  . Drug Use: No  . Sexual Activity: Not on file   Other Topics Concern  . Not on file   Social History Narrative  . No narrative on file    Medications:   No current outpatient prescriptions on file prior to visit.   No current facility-administered medications on file prior to visit.    Allergies:  Not on File  Physical Exam General: well developed, well nourished, seated, in no evident distress Head: head normocephalic and atraumatic. Orohparynx benign Neck: supple with no carotid or supraclavicular bruits Cardiovascular: regular rate and rhythm, no murmurs Musculoskeletal: no deformity Skin:  no rash/petichiae Vascular:  Normal pulses all extremities Filed Vitals:   03/12/13 1327  BP: 134/88  Pulse: 81    Neurologic Exam Mental Status: Awake and fully alert. Oriented to place and time. Recent and remote memory intact. Attention span, concentration and fund of knowledge appropriate. Mood and affect appropriate. MMSE 25/30 with deficits in orientation and recall. Cranial Nerves: Fundoscopic exam reveals sharp disc margins. Pupils equal, briskly reactive to light. Extraocular  movements full without nystagmus. Visual fields full to confrontation. Hearing intact. Facial sensation intact. Face, tongue, palate moves normally and symmetrically.  Motor: Normal bulk and tone. Normal strength in all tested extremity muscles. Right shoulder hip elevation limited due to pain. Sensory.: intact to tough and pinprick and vibratory.  Coordination: Rapid alternating movements normal in all  extremities. Finger-to-nose and heel-to-shin performed accurately bilaterally. Gait and Station: Arises from chair without difficulty. Stance is normal. Gait demonstrates normal stride length and balance . Not able to heel, toe and tandem walk without difficulty.  Reflexes: 1+ and symmetric. Toes downgoing.      ASSESSMENT: 81 year three-month history of intermittent confusion and behavioral changes likely due to mild cognitive impairment. Etiology to be determined though underlying stress and anxiety  may have contributed    PLAN: I had a long discussion the patient and his wife regarding his symptoms, discuss results of her evaluation, plan for further testing and answered questions. I recommend he try Cerefolin na.c. and fish oil daily for his mild cognitive impairment. I encouraged him to participate in cognitively challenging activities like solving crossword puzzles, sudoku and playing bridge. Check MRI scan of the brain with and without contrast and dementia panel labs. Return for followup in 2 months or earlier if necessary   Note: This document was prepared with digital dictation and possible smart phrase technology. Any transcriptional errors that result from this process are unintentional.

## 2013-03-13 LAB — DEMENTIA PANEL
HOMOCYSTEINE: 16.1 umol/L — AB (ref 0.0–15.0)
SYPHILIS RPR SCR: NONREACTIVE
TSH: 4.14 u[IU]/mL (ref 0.450–4.500)
VITAMIN B 12: 405 pg/mL (ref 211–946)

## 2013-03-18 ENCOUNTER — Telehealth: Payer: Self-pay | Admitting: Neurology

## 2013-03-18 MED ORDER — CEREFOLIN NAC 6-2-600 MG PO TABS: 1.0000 | ORAL_TABLET | Freq: Every day | ORAL | Status: DC

## 2013-03-18 NOTE — Telephone Encounter (Signed)
Pt's wife, Alona BeneJoyce, called and stated that when they were in the office for his visit last week Dr. Pearlean BrownieSethi discussed prescribing 2 medications for age related memory loss.  They did not receive the written prescriptions at check out and he told them that one of them could only be gotten from a particular pharmacy.  They would like to get these prescriptions as well as the location of where to pick up the specialty medicine.  You may call Alona BeneJoyce on her cell at [phone] and if she doesn't answer please leave her a voicemail.  Thank you

## 2013-03-18 NOTE — Telephone Encounter (Signed)
OV says: I recommend he try Cerefolin na.c. and fish oil daily for his mild cognitive impairment. [facility] from [facility] and Fish Oil is available OTC at any drug store.  I called back.  Got no answer.  Left message.

## 2013-03-19 ENCOUNTER — Telehealth: Payer: Self-pay | Admitting: Neurology

## 2013-03-19 NOTE — Telephone Encounter (Signed)
Patient's wife calling to state that she received a missed call from our office and thinks it may have been in regards to patient getting sooner appointment. No voicemail was left, please return call to patient.

## 2013-03-19 NOTE — Telephone Encounter (Signed)
Patient calling wanting to clarify about the fish oil scripts as previously discussed in last message, please call patient's wife back for more information.

## 2013-03-20 NOTE — Telephone Encounter (Signed)
I called back.  Spoke with Ms [name].  She said [facility] did call her and leave a message about Cerefolin, but the message messed up, so she will call them back.  She says they did get the Fish Oil as well, but she was certain another medication for memory was supposed to be prescribed.  She would like a message sent to the provider asking about this.  Advised he was not in the office today, she verbalized understanding and is okay to wait until he returns.

## 2013-03-20 NOTE — Telephone Encounter (Signed)
Called patient and spoke with patient's wife [name] concerning patient medication that she said Dr. Pearlean BrownieSethi would prescribe for the patient. Looking in the last OV, Dr. Pearlean BrownieSethi stated that he would prescribe the medication Cerefolin and would like if the patient took fish oil. Patient's wife stated that Dr. Pearlean BrownieSethi said that he would prescribe two medication and would like for the patient to take fish oil. Patient's wife would like some clarification concerning this message and she also stated that she still have not heard from the specialty pharmacy. Please advise.

## 2013-03-24 ENCOUNTER — Other Ambulatory Visit: Payer: Medicare Other | Admitting: Radiology

## 2013-03-25 NOTE — Telephone Encounter (Signed)
Called patient and spoke with patient's wife Ander SladeJoy to make an appt for 05/06/13 at 3 pm with Dr. Pearlean BrownieSethi. I advised the patient's wife that if the patient has any other problems, questions or concerns to call the office. Patient's wife verbalized understanding.

## 2013-03-26 ENCOUNTER — Ambulatory Visit
Admission: RE | Admit: 2013-03-26 | Discharge: 2013-03-26 | Disposition: A | Discharge status: Home / Self Care | Payer: Medicare Other | Source: Ambulatory Visit | Attending: Neurology | Admitting: Neurology

## 2013-03-26 DIAGNOSIS — R413 Other amnesia: Secondary | ICD-10-CM

## 2013-03-26 DIAGNOSIS — G3184 Mild cognitive impairment, so stated: Secondary | ICD-10-CM

## 2013-03-26 DIAGNOSIS — F419 Anxiety disorder, unspecified: Secondary | ICD-10-CM

## 2013-03-26 MED ORDER — GADOBENATE DIMEGLUMINE 529 MG/ML IV SOLN
16.0000 mL | Freq: Once | INTRAVENOUS | Status: AC | PRN
  Administered 2013-03-26: 16 mL via INTRAVENOUS

## 2013-04-01 ENCOUNTER — Telehealth: Payer: Self-pay | Admitting: Neurology

## 2013-04-01 NOTE — Progress Notes (Signed)
Quick Note:  I called and spoke to pt and gave him the results of MRI brain (age related changes). Wife may return call for results. ______

## 2013-04-01 NOTE — Telephone Encounter (Signed)
Completed by Alverda SkeansSandy Young

## 2013-04-01 NOTE — Telephone Encounter (Signed)
Patient's wife calling back to get the results of the MRI.  Please try the home number first and [phone]  an alternate number

## 2013-04-03 ENCOUNTER — Other Ambulatory Visit (INDEPENDENT_AMBULATORY_CARE_PROVIDER_SITE_OTHER): Payer: Medicare Other | Admitting: Radiology

## 2013-04-03 DIAGNOSIS — R413 Other amnesia: Secondary | ICD-10-CM

## 2013-04-03 DIAGNOSIS — G3184 Mild cognitive impairment, so stated: Secondary | ICD-10-CM

## 2013-05-06 ENCOUNTER — Ambulatory Visit (INDEPENDENT_AMBULATORY_CARE_PROVIDER_SITE_OTHER): Payer: Medicare Other | Admitting: Neurology

## 2013-05-06 ENCOUNTER — Encounter (INDEPENDENT_AMBULATORY_CARE_PROVIDER_SITE_OTHER): Payer: Self-pay

## 2013-05-06 ENCOUNTER — Encounter: Payer: Self-pay | Admitting: Neurology

## 2013-05-06 VITALS — BP 126/82 | HR 86 | Ht 68.0 in | Wt 181.0 lb

## 2013-05-06 DIAGNOSIS — G3184 Mild cognitive impairment, so stated: Secondary | ICD-10-CM

## 2013-05-06 NOTE — Patient Instructions (Addendum)
I had a long discussion the patient and wife regarding his mild cognitive impairment, discussed the results of his lab work and imaging studies and answered questions. He was advised to continue Cerefolin NAC and fish oill daily as well as continue participation in cognitively challenging activities. Return for followup in 3 months with Larita Fife, NP or call earlier if necessary  Mild Neurocognitive Disorder Mild neurocognitive disorder (formerly known as mild cognitive impairment) is a mental disorder. It is a slight abnormal decrease in mental function. The areas of mental function affected may include memory, thought, communication, behavior, and completion of tasks. The decrease is noticeable and measurable but does not interfere substantially with your daily activities. Mild neurocognitive disorder typically occurs in people older than 60 years but can occur earlier. It is not as serious as major neurocognitive disorder (formerly known as dementia) but may lead to a more serious neurocognitive disorder. However, in some cases the condition does not progress. A few people with mild neurocognitive disorder even improve. CAUSES  There are a number of different causes of mild neurocognitive disorder:   Brain disorders associated with abnormal protein deposits, such as Alzheimer disease, Pick disease, and Lewy body disease.  Brain disorders associated with abnormal movement, such as Parkinson disease and Huntington disease.  Diseases affecting blood vessels in the brain and resulting in mini-strokes.  Certain infections such as human immunodeficiency virus (HIV) infection.  Traumatic brain injury.  Other medical conditions such as brain tumors, underactive thyroid (hypothyroidism), and vitamin B12 deficiency.  Use of certain prescription medicine and "recreational" drugs. SYMPTOMS  Symptoms of mild neurocognitive disorder include:  Difficulty remembering You may forget details of recent events,  names, or phone numbers. You may forget important social events and appointments or repeatedly forget where you put your car keys.  Difficulty thinking and solving problems You may have difficulty with complex tasks such as paying bills or driving in unfamiliar locations.  Difficulty communicating You may have difficulty finding the right word, naming an object, forming a sentence that makes sense, or understanding what you read or hear.  Changes in your behavior or personality You may lose interest in the things that you used to enjoy or withdraw from social situations. You may get angry more easily than usual. You may act before thinking. You may do things in public that you would not usually do. You may hear or see things that are not real (hallucinations). You may believe falsely that others are trying to hurt you (paranoia). DIAGNOSIS Mild neurocognitive disorder is diagnosed through an assessment by your health care provider. Your health care provider will ask you and your family, friends, or coworkers questions about your symptoms, their frequency, their duration and progression, and the effect they are having on your life. Your health care provider may refer you to a neurologist or mental health specialist for a detailed evaluation of your mental functions (neuropsychological testing).  To identify the cause of your mild neurocognitive disorder, your health care provider may:  Obtain a detailed medical history.  Ask about alcohol and drug use, including prescription medicine.  Perform a physical exam.  Order blood tests and brain imaging exams. TREATMENT  Mild neurocognitive disorder caused by infections, use of certain medicines or recreational drugs, and certain medical conditions may improve with treatment of the condition that is causing mild neurocognitive disorder. Mild neurocognitive disorder resulting from other causes generally does not improve and may worsen. In these cases,  the goal of treatment is to  slow progression of the disorder and help you cope with the loss of cognitive function. Treatments in these cases include:   Medicine Medication helps mainly with memory loss and behavioral symptoms.   Talk therapy Talk therapy provides education, emotional support, memory aids, and other ways of compensating for decreases in mental function.   Lifestyle changes These include regular exercise, a healthy diet (including essential omega-3 fatty acids), intellectual stimulation, and increased social interaction. Document Released: 09/24/2012 Document Reviewed: 09/24/2012 [facility] Patient Information 2014 [place] [employer] [place].

## 2013-05-06 NOTE — Progress Notes (Signed)
[facility] [address] [place] [id number] [phone]       [name] FOLLOWUP VISIT  NOTE  [name] Date of Birth:  02/25/1931 Medical Record Number:  [id number]   Referring MD:  [name]  Reason for Referral:  Confusion and memory loss  HPI:  Update 05/06/13 : He returns for followup after last visit 2 months ago. He is accompanied by his wife who states that she's noticed improvement in his memory and cognitive difficulties. He has been taking Cerefolin NAC daily as well as fish oil. He has also been under less stress recently and has started to in activities that he enjoys. He underwent diagnostic testing at the last visit which included EEG on 04/03/13 which was normal. MRI scan of the brain on 03/26/13 showed moderate changes of generalized cerebral atrophy and mild incidental changes of degenerative arthritis in the neck. He denies any symptoms of radiculopathy myelopathy gait difficulties her bladder bowel retention. Lab work on him 03/12/13 showed normal vitamin B12, RPR, TSH and homocystine was borderline elevated at 16.1. INITIAL Consult 03/12/13 :[age] year Caucasian male who since spring of 2014 has had some intermittent confusion and memory difficulties as well as trouble driving. He has also had some balance difficulties and falls. Patient states he was under significant stress as he was going through a lawsuit involving her). Here started on Celexa 2 months ago by her primary physician but stopped it after a few weeks as he felt it did not help. Lawsuit fortunately got over last week. The patient feels that he is better now. This was also depressed which also seems to improve. Was withdrawn and not going or or participating in activities. He denies significant headache, seizure, loss of consciousness, focal weakness. He has not had any brain imaging studies done. There is no prior history of strokes, TIAs, seizures. Is no family history of dementia. He  has not specifically tried medications for improvement of I for treatment for depression. He had mini mental of 21/30 and Dr. Zebedee IbaMackenzie's [name] last month but today he did better with 25/30 in our [name].   ROS:   14 system review of systems is positive for neck pain, memory loss, ringing in ears, aching muscles and all other systems negative. PMH:  Past Medical History  Diagnosis Date  . Heart attack   . Depression with anxiety   . Hypertension   . CRF (chronic renal failure)   . COPD (chronic obstructive pulmonary disease)   . RLS (restless legs syndrome)   . Unspecified vitamin D deficiency   . Vitamin B 12 deficiency   . Allergic rhinitis     Social History:  History   Social History  . Marital Status: Married    Spouse Name: N/A    Number of Children: 4  . Years of Education: 12th   Occupational History  . retired    Social History Main Topics  . Smoking status: Former Games developermoker  . Smokeless tobacco: Not on file  . Alcohol Use: Yes  . Drug Use: No  . Sexual Activity: Not Currently   Other Topics Concern  . Not on file   Social History Narrative   Patient lives at home with his wife.    Medications:   Current Outpatient Prescriptions on File Prior to Visit  Medication Sig Dispense Refill  . aspirin 81 MG tablet Take 81 mg by mouth daily.      . Cyclobenzaprine HCl (FLEXERIL PO)  Take by mouth daily with supper.      . Methylfol-Methylcob-Acetylcyst (CEREFOLIN NAC) 6-2-600 MG TABS Take 1 tablet by mouth daily.  90 each  1  . tamsulosin (FLOMAX) 0.4 MG CAPS capsule Take 0.4 mg by mouth.       No current facility-administered medications on file prior to visit.    Allergies:  No Known Allergies  Physical Exam General: well developed, well nourished, seated, in no evident distress Head: head normocephalic and atraumatic. Orohparynx benign Neck: supple with no carotid or supraclavicular bruits Cardiovascular: regular rate and rhythm, no  murmurs Musculoskeletal: no deformity Skin:  no rash/petichiae Vascular:  Normal pulses all extremities Filed Vitals:   05/06/13 1609  BP: 126/82  Pulse: 86    Neurologic Exam Mental Status: Awake and fully alert. Oriented to place and time. Recent and remote memory intact. Attention span, concentration and fund of knowledge appropriate. Mood and affect appropriate. MMSE 29/30 with deficits in  Recall only. Animal fluency tests he scored 5. Geriatric depression scale 6 only. Cranial Nerves: Fundoscopic exam reveals sharp disc margins. Pupils equal, briskly reactive to light. Extraocular movements full without nystagmus. Visual fields full to confrontation. Hearing intact. Facial sensation intact. Face, tongue, palate moves normally and symmetrically.  Motor: Normal bulk and tone. Normal strength in all tested extremity muscles. Right shoulder hip elevation limited due to pain. Sensory.: intact to tough and pinprick and vibratory.  Coordination: Rapid alternating movements normal in all extremities. Finger-to-nose and heel-to-shin performed accurately bilaterally. Gait and Station: Arises from chair without difficulty. Stance is normal. Gait demonstrates normal stride length and balance . Not able to heel, toe and tandem walk without difficulty.  Reflexes: 1+ and symmetric. Toes downgoing.      ASSESSMENT: 81 year three-month history of intermittent confusion and behavioral changes likely due to mild cognitive impairment. Work up for treatable causes has been negative but he has shown improvement.  PLAN: I had a long discussion the patient and wife regarding his mild cognitive impairment, discussed the results of his lab work and imaging studies and answered questions. He was advised to continue Cerefolin NAC and fish oill daily as well as continue participation in cognitively challenging activities. Return for followup in 3 months with Douglas Fife, NP or call earlier if necessary   Note: This  document was prepared with digital dictation and possible smart phrase technology. Any transcriptional errors that result from this process are unintentional.

## 2013-06-23 ENCOUNTER — Ambulatory Visit: Payer: Medicare Other | Admitting: Neurology

## 2013-08-11 ENCOUNTER — Telehealth: Payer: Self-pay | Admitting: Nurse Practitioner

## 2013-08-11 ENCOUNTER — Ambulatory Visit: Payer: Medicare Other | Admitting: Nurse Practitioner

## 2013-08-11 NOTE — Telephone Encounter (Signed)
Patient was no show for today's office appointment.  

## 2013-09-09 NOTE — Telephone Encounter (Signed)
Noted  

## 2013-10-05 ENCOUNTER — Other Ambulatory Visit: Payer: Self-pay | Admitting: Neurology

## 2013-10-05 ENCOUNTER — Telehealth: Payer: Self-pay | Admitting: Neurology

## 2013-10-05 MED ORDER — CEREFOLIN NAC 6-2-600 MG PO TABS
1.0000 | ORAL_TABLET | Freq: Every day | ORAL | Status: DC
Start: 2013-10-05 — End: 2014-01-13

## 2013-10-05 NOTE — Telephone Encounter (Signed)
Ok to refill Cerefolin nac

## 2013-10-05 NOTE — Telephone Encounter (Signed)
Patient and wife calling stating they need a rush refill on his Cerefolin.

## 2013-10-05 NOTE — Telephone Encounter (Signed)
Rx has been sent.  I called back, got no answer.  Left message. 

## 2013-10-05 NOTE — Telephone Encounter (Signed)
Patient no showed last appt.  I called back 5 times.  The first two times I got a busy signal and the last 3 times a recording said this number is not in service.

## 2013-10-05 NOTE — Telephone Encounter (Signed)
Spouse is requesting a refill on Cerefolin.  Patient was seen in April, noting to follow up in 3 months, earlier if necessary.  Patient no showed last appt in July.  Spouse does not wish to reschedule, as she feels he does not need to be seen.  Would you like to fill Cerefolin?  Please advise.  Thank you.

## 2013-10-05 NOTE — Telephone Encounter (Signed)
Patient spouse questioning why appointment is needed?  She feels patient doesn't need to be seen.  Please return call anytime and may leave detailed message on voice mail.  May call cell # [phone] in case patient isn't at home.

## 2013-10-22 ENCOUNTER — Ambulatory Visit
Admission: RE | Admit: 2013-10-22 | Discharge: 2013-10-22 | Disposition: A | Discharge status: Home / Self Care | Payer: Medicare Other | Source: Ambulatory Visit | Attending: Internal Medicine | Admitting: Internal Medicine

## 2013-10-22 ENCOUNTER — Other Ambulatory Visit: Payer: Self-pay | Admitting: Internal Medicine

## 2013-10-22 DIAGNOSIS — I77810 Thoracic aortic ectasia: Secondary | ICD-10-CM

## 2014-01-13 ENCOUNTER — Other Ambulatory Visit: Payer: Self-pay | Admitting: Neurology

## 2014-02-17 DIAGNOSIS — J069 Acute upper respiratory infection, unspecified: Secondary | ICD-10-CM | POA: Diagnosis not present

## 2014-02-17 DIAGNOSIS — L570 Actinic keratosis: Secondary | ICD-10-CM | POA: Diagnosis not present

## 2014-03-03 ENCOUNTER — Institutional Professional Consult (permissible substitution): Payer: Self-pay | Admitting: Internal Medicine

## 2014-03-03 DIAGNOSIS — R8299 Other abnormal findings in urine: Secondary | ICD-10-CM | POA: Diagnosis not present

## 2014-03-04 DIAGNOSIS — E785 Hyperlipidemia, unspecified: Secondary | ICD-10-CM | POA: Diagnosis not present

## 2014-03-04 DIAGNOSIS — J439 Emphysema, unspecified: Secondary | ICD-10-CM | POA: Diagnosis not present

## 2014-03-04 DIAGNOSIS — R7301 Impaired fasting glucose: Secondary | ICD-10-CM | POA: Diagnosis not present

## 2014-03-04 DIAGNOSIS — E039 Hypothyroidism, unspecified: Secondary | ICD-10-CM | POA: Diagnosis not present

## 2014-03-04 DIAGNOSIS — I712 Thoracic aortic aneurysm, without rupture: Secondary | ICD-10-CM | POA: Diagnosis not present

## 2014-03-04 DIAGNOSIS — D81818 Other biotin-dependent carboxylase deficiency: Secondary | ICD-10-CM | POA: Diagnosis not present

## 2014-03-04 DIAGNOSIS — I2510 Atherosclerotic heart disease of native coronary artery without angina pectoris: Secondary | ICD-10-CM | POA: Diagnosis not present

## 2014-03-04 DIAGNOSIS — E559 Vitamin D deficiency, unspecified: Secondary | ICD-10-CM | POA: Diagnosis not present

## 2014-03-04 DIAGNOSIS — E538 Deficiency of other specified B group vitamins: Secondary | ICD-10-CM | POA: Diagnosis not present

## 2014-03-04 DIAGNOSIS — I10 Essential (primary) hypertension: Secondary | ICD-10-CM | POA: Diagnosis not present

## 2014-03-11 ENCOUNTER — Other Ambulatory Visit (INDEPENDENT_AMBULATORY_CARE_PROVIDER_SITE_OTHER): Payer: Commercial Managed Care - HMO

## 2014-03-11 ENCOUNTER — Ambulatory Visit (INDEPENDENT_AMBULATORY_CARE_PROVIDER_SITE_OTHER): Payer: Commercial Managed Care - HMO | Admitting: Internal Medicine

## 2014-03-11 ENCOUNTER — Ambulatory Visit (INDEPENDENT_AMBULATORY_CARE_PROVIDER_SITE_OTHER)
Admission: RE | Admit: 2014-03-11 | Discharge: 2014-03-11 | Disposition: A | Discharge status: Home / Self Care | Payer: Commercial Managed Care - HMO | Source: Ambulatory Visit | Attending: Internal Medicine | Admitting: Internal Medicine

## 2014-03-11 ENCOUNTER — Encounter: Payer: Self-pay | Admitting: Internal Medicine

## 2014-03-11 ENCOUNTER — Other Ambulatory Visit: Payer: Self-pay

## 2014-03-11 VITALS — BP 128/80 | HR 94 | Temp 98.1°F | Ht 68.5 in | Wt 191.6 lb

## 2014-03-11 DIAGNOSIS — J449 Chronic obstructive pulmonary disease, unspecified: Secondary | ICD-10-CM | POA: Diagnosis not present

## 2014-03-11 DIAGNOSIS — R0602 Shortness of breath: Secondary | ICD-10-CM

## 2014-03-11 DIAGNOSIS — I10 Essential (primary) hypertension: Secondary | ICD-10-CM | POA: Diagnosis not present

## 2014-03-11 LAB — BASIC METABOLIC PANEL
BUN: 14 mg/dL (ref 6–23)
CHLORIDE: 107 meq/L (ref 96–112)
CO2: 26 mEq/L (ref 19–32)
CREATININE: 1.3 mg/dL (ref 0.40–1.50)
Calcium: 9.5 mg/dL (ref 8.4–10.5)
GFR: 56.42 mL/min — AB (ref >60.00)
GLUCOSE: 93 mg/dL (ref 70–99)
Potassium: 4.1 mEq/L (ref 3.5–5.1)
Sodium: 138 mEq/L (ref 135–145)

## 2014-03-11 LAB — CBC WITH DIFFERENTIAL/PLATELET
BASOS ABS: 0.1 10*3/uL (ref 0.0–0.1)
Basophils Relative: 0.7 % (ref 0.0–3.0)
Eosinophils Absolute: 0.5 10*3/uL (ref 0.0–0.7)
Eosinophils Relative: 7.6 % — ABNORMAL HIGH (ref 0.0–5.0)
HCT: 41.5 % (ref 39.0–52.0)
Hemoglobin: 14.4 g/dL (ref 13.0–17.0)
LYMPHS ABS: 1.5 10*3/uL (ref 0.7–4.0)
Lymphocytes Relative: 21.3 % (ref 12.0–46.0)
MCHC: 34.8 g/dL (ref 30.0–36.0)
MCV: 94.2 fl (ref 78.0–100.0)
Monocytes Absolute: 0.7 10*3/uL (ref 0.1–1.0)
Monocytes Relative: 9.5 % (ref 3.0–12.0)
Neutro Abs: 4.4 10*3/uL (ref 1.4–7.7)
Neutrophils Relative %: 60.9 % (ref 43.0–77.0)
Platelets: 176 10*3/uL (ref 150.0–400.0)
RBC: 4.41 Mil/uL (ref 4.22–5.81)
RDW: 13.3 % (ref 11.5–15.5)
WBC: 7.2 10*3/uL (ref 4.0–10.5)

## 2014-03-11 LAB — BRAIN NATRIURETIC PEPTIDE: Pro B Natriuretic peptide (BNP): 67 pg/mL (ref 0.0–100.0)

## 2014-03-11 MED ORDER — PREDNISONE 10 MG PO TABS: ORAL_TABLET | ORAL | Status: DC

## 2014-03-11 MED ORDER — VALSARTAN 160 MG PO TABS: 160.0000 mg | ORAL_TABLET | Freq: Every day | ORAL | Status: DC

## 2014-03-11 NOTE — Progress Notes (Signed)
Subjective:     Patient ID: Douglas PentonRalph A Rohman, male   DOB: 08/16/1931,     MRN: [id number]  HPI  [age] yowm quit smoking last by eval by Dr Jayme CloudGonzalez  2012 :  1. Dyspnea. Out of proportion to the patient's objective findings on  cardiopulmonary stress test. 2. Fatigue, which is the main component of his number one complaint.  He does have difficulties with sleep, query sleep apnea. 3. Mild chronic obstructive pulmonary disease on Spiriva and  Symbicort.   03/11/2014 1st [facility] office visit/ Molina Hollenback   Chief Complaint  Patient presents with  . Pulmonary Consult    Referred by Dr. Ronne BinningMckenzie. Pt c/o SOB and cough "for years" for x 3 wks. He states that he used to enjoy walking for exercise and can no longer do this b/c of his SOB. Cough is prod with white sputum.   was better since last ov on  No resp meds at all then  indolent onset doe x Aug 2015 could walk 31 min s stopping some hills, average  Cough started about a month prior to OV  > using lots of halls / sporadic day > noct, never purulent or bloody  Cough and sob Worse when bends over Prednisone helps a little and only transient/ flonase not helping much/ zyrtec d helps the nose some but not the sob/cough  No better with saba /advair   No obvious day to day or daytime variabilty or assoc c  cp or chest tightness, subjective wheeze overt sinus or hb symptoms. No unusual exp hx or h/o childhood pna/ asthma or knowledge of premature birth.  Sleeping ok without nocturnal  or early am exacerbation  of respiratory  c/o's or need for noct saba. Also denies any obvious fluctuation of symptoms with weather or environmental changes or other aggravating or alleviating factors except as outlined above   Current Medications, Allergies, Complete Past Medical History, Past Surgical History, Family History, and Social History were reviewed [facility] electronic [facility].  ROS  The following are not active complaints  unless bolded sore throat, dysphagia, dental problems, itching, sneezing,  nasal congestion or excess/ purulent secretions, ear ache,   fever, chills, sweats, unintended wt loss, pleuritic or exertional cp, hemoptysis,  orthopnea pnd or leg swelling, presyncope, palpitations, heartburn, abdominal pain, anorexia, nausea, vomiting, diarrhea  or change in bowel or urinary habits, change in stools or urine, dysuria,hematuria,  rash, arthralgias, visual complaints, headache, numbness weakness or ataxia or problems with walking or coordination,  change in mood/affect or memory.        Review of Systems     Objective:   Physical Exam Very harsh cough on insp  Wt Readings from Last 3 Encounters:  03/11/14 191 lb 9.6 oz (86.909 kg)  05/06/13 181 lb (82.101 kg)  03/12/13 175 lb (79.379 kg)    Vital signs reviewed   HEENT: nl dentition, turbinates, and orophanx. Nl external ear canals without cough reflex   NECK :  without JVD/Nodes/TM/ nl carotid upstrokes bilaterally   LUNGS: no acc muscle use, clear to A and P bilaterally without cough on insp or exp maneuvers   CV:  RRR  no s3 or murmur or increase in P2, no edema   ABD:  soft and nontender with nl excursion in the supine position. No bruits or organomegaly, bowel sounds nl  MS:  warm without deformities, calf tenderness, cyanosis or clubbing  SKIN: warm and dry without lesions  NEURO:  alert, approp, no deficits    Labs ordered today / images reviewed include:   cxr with low lung volumes/ kyphosis   Recent Labs Lab 03/11/14 1129  NA 138  K 4.1  CL 107  CO2 26  BUN 14  CREATININE 1.30  GLUCOSE 93    Recent Labs Lab 03/11/14 1129  HGB 14.4  HCT 41.5  WBC 7.2  PLT 176.0     Lab Results  Component Value Date   TSH 4.140 03/12/2013     Lab Results  Component Value Date   PROBNP 67.0 03/11/2014            Assessment:

## 2014-03-11 NOTE — Assessment & Plan Note (Signed)
No evidence  chf / anemia/ renal insuff by labs performed today, see above

## 2014-03-11 NOTE — Patient Instructions (Addendum)
Stop cozar and diovan 160 mg one daily   Stop halls   Prednisone 10 mg take  4 each am x 2 days,   2 each am x 2 days,  1 each am x 2 days and stop   Omeprazole 20 mg Take 30- 60 min before your first and last meals of the day   GERD (REFLUX)  is an extremely common cause of respiratory symptoms just like yours , many times with no obvious heartburn at all.    It can be treated with medication, but also with lifestyle changes including avoidance of late meals, excessive alcohol, smoking cessation, and avoid fatty foods, chocolate, peppermint, colas, red wine, and acidic juices such as orange juice.  NO MINT OR MENTHOL PRODUCTS SO NO COUGH DROPS  USE SUGARLESS CANDY INSTEAD (Jolley ranchers or Stover's or Life Savers) or even ice chips will also do - the key is to swallow to prevent all throat clearing. NO OIL BASED VITAMINS - use powdered substitutes.  Please remember to go to the lab and x-ray department downstairs for your tests - we will call you with the results when they are available.    Please schedule a follow up office visit in 4 weeks, sooner if needed with pfts on return and bring all your active medications with you

## 2014-03-11 NOTE — Assessment & Plan Note (Signed)
For reasons that may related to vascular permability and nitric oxide pathways but not elevated  bradykinin levels (as seen with  ACEi use) losartan in the generic form has been reported now from mulitple sources  to cause a similar pattern of non-specific  upper airway symptoms as seen with acei.   This has not been reported with exposure to the other ARB's to date, so it seems reasonable for now to try either generic diovan or avapro if ARB needed or use an alternative class altogether.  See:  Dewayne HatchAnn Allergy Asthma Immunol  2008: 101: p 495-499    rec stop cozar and try diova 160 mg instead or change to BB > Strongly prefer in this setting: Bystolic, the most beta -1  selective Beta blocker available in sample form, with bisoprolol the most selective generic choice  on the market.

## 2014-03-11 NOTE — Assessment & Plan Note (Addendum)
  When respiratory symptoms begin or become refractory well after a patient reports complete smoking cessation,  Especially when this wasn't the case while they were smoking, a red flag is raised based on the work of Dr Primitivo GauzeFletcher which states:  if you quit smoking when your best day FEV1 is still well preserved it is highly unlikely you will progress to severe disease.  That is to say, once the smoking stops,  the symptoms should not suddenly erupt or markedly worsen.  If so, the differential diagnosis should include  obesity/deconditioning,  LPR/Reflux/Aspiration syndromes,  occult CHF, or  especially side effect of medications commonly used in this population (advair, acei come to mind both of which can destabilize the upper airway and mimic aecopd)   I strongly suspect this is LPR/ extraesophageal reflux exac by copious use of Hall's menthol cough drops (he had 4 bags of them with him at ov)  Rec: max gerd rx then regroup with pfts rather than add copd meds at this point / stop all inhalers in meantime as he has been better off s them for years     Each maintenance medication was reviewed in detail including most importantly the difference between maintenance and as needed and under what circumstances the prns are to be used.  Please see instructions for details which were reviewed in writing and the patient given a copy.

## 2014-03-12 ENCOUNTER — Telehealth: Payer: Self-pay | Admitting: *Deleted

## 2014-03-12 NOTE — Telephone Encounter (Signed)
Spoke with patient's spouse [name] and r/s 03/25/14 appointment to 06/04/14 at 11 am.

## 2014-03-12 NOTE — Progress Notes (Signed)
Quick Note:  Spoke with pt and notified of results per Dr. Wert. Pt verbalized understanding and denied any questions.  ______ 

## 2014-03-25 ENCOUNTER — Ambulatory Visit: Payer: Medicare Other | Admitting: Neurology

## 2014-03-29 ENCOUNTER — Other Ambulatory Visit: Payer: Self-pay | Admitting: Internal Medicine

## 2014-03-29 ENCOUNTER — Telehealth: Payer: Self-pay | Admitting: Internal Medicine

## 2014-03-29 MED ORDER — PREDNISONE 10 MG PO TABS: ORAL_TABLET | ORAL | Status: DC

## 2014-03-29 MED ORDER — FAMOTIDINE 20 MG PO TABS
20.0000 mg | ORAL_TABLET | Freq: Two times a day (BID) | ORAL | Status: DC
Start: 2014-03-29 — End: 2014-04-21

## 2014-03-29 NOTE — Telephone Encounter (Signed)
Called and spoke to pt. Pt c/o prod cough -pt unsure of color, insomnia secondary to cough, fatigue, and increase in SOB x several weeks. Pt was seen on 03/11/14 by MW and pt stated he was feeling better and then began to feel worse shortly after visit. Pt denies CP/tightness, f/c/s. Pt has a f/u with MW on 04/09/14 with PFT. Pt requesting recs.   MW please advise.   No Known Allergies

## 2014-03-29 NOTE — Telephone Encounter (Signed)
Called and spoke to pt. Informed pt of the recs per MW. Rx sent to preferred pharmacy. Pt verbalized understanding and denied any further questions or concerns at this time.   

## 2014-03-29 NOTE — Telephone Encounter (Signed)
Add pepcid 20 mg otc at hs until return with all active meds in hand  Prednisone 10 mg take  4 each am x 2 days,   2 each am x 2 days,  1 each am x 2 days and stop   Call if condition worsens on this rx and we'll work him in to the schdule for the ov but need to wait until 04/09/14 for the pfts

## 2014-03-30 ENCOUNTER — Telehealth: Payer: Self-pay | Admitting: Internal Medicine

## 2014-03-30 MED ORDER — OMEPRAZOLE 20 MG PO CPDR
20.0000 mg | DELAYED_RELEASE_CAPSULE | Freq: Every day | ORAL | Status: DC
Start: 2014-03-30 — End: 2014-04-21

## 2014-03-30 NOTE — Telephone Encounter (Signed)
Advised pt's wife that the only rx we could give is Omeprazole. The other 3 rx's will need to come from PCP. She agreed.

## 2014-04-01 DIAGNOSIS — M19011 Primary osteoarthritis, right shoulder: Secondary | ICD-10-CM | POA: Diagnosis not present

## 2014-04-02 ENCOUNTER — Ambulatory Visit: Payer: Commercial Managed Care - HMO | Admitting: Cardiology

## 2014-04-05 ENCOUNTER — Ambulatory Visit (INDEPENDENT_AMBULATORY_CARE_PROVIDER_SITE_OTHER): Payer: Commercial Managed Care - HMO | Admitting: Cardiology

## 2014-04-05 ENCOUNTER — Encounter: Payer: Self-pay | Admitting: Cardiology

## 2014-04-05 VITALS — BP 114/90 | HR 89 | Ht 68.5 in | Wt 192.2 lb

## 2014-04-05 DIAGNOSIS — I2510 Atherosclerotic heart disease of native coronary artery without angina pectoris: Secondary | ICD-10-CM | POA: Diagnosis not present

## 2014-04-05 DIAGNOSIS — R0602 Shortness of breath: Secondary | ICD-10-CM

## 2014-04-05 DIAGNOSIS — Z01818 Encounter for other preprocedural examination: Secondary | ICD-10-CM | POA: Diagnosis not present

## 2014-04-05 DIAGNOSIS — M25519 Pain in unspecified shoulder: Secondary | ICD-10-CM | POA: Diagnosis not present

## 2014-04-05 DIAGNOSIS — I10 Essential (primary) hypertension: Secondary | ICD-10-CM | POA: Diagnosis not present

## 2014-04-05 DIAGNOSIS — E785 Hyperlipidemia, unspecified: Secondary | ICD-10-CM

## 2014-04-05 DIAGNOSIS — I252 Old myocardial infarction: Secondary | ICD-10-CM

## 2014-04-05 NOTE — Progress Notes (Addendum)
Cardiology Office Note   Date:  04/12/2014   ID:  [name], DOB 1931-06-09, MRN [id number]  PCP:  Thayer Headings, MD  Cardiologist:   Quintella Reichert, MD   No chief complaint on file.     History of Present Illness: [name] is a 81 y.o. male with history of ASCAD s/p remote IWMI 2004 with PCI of the RCA with BMS and repeat cath 2007 due to recurrent CP and abnormal nuclear stress test and cath showed widely patent RCA stent with 30-50% distal stenosis before the takeoff of a PL and PDA and normal LVF.  He also has a history of dyslipidemia, mildly dilated aortic root, HTN, COPD and now presents for preop clearance for shoulder replacement.  He denies any chest pain or pressure.  He has had some DOE and has been seeing Dr. Sherene Sires who thinks his DOE is due to GERD.  He denies any palpitations, dizziness, LE edema or syncope.  Dr. Sherene Sires discontinued several meds that he though could be contributing to his SOB including losartan and changed him to Diovan.  He thinks his SOB has improved some but not a lot.  The cough has resolved.      Past Medical History  Diagnosis Date  . Heart attack   . Depression with anxiety   . Hypertension   . CRF (chronic renal failure)   . COPD (chronic obstructive pulmonary disease)   . RLS (restless legs syndrome)   . Unspecified vitamin D deficiency   . Vitamin B 12 deficiency   . Allergic rhinitis   . Coronary artery disease     s/p remote IWMI 2004 with PCI of the RCA with BMS and repeat cath 2007 due to recurrent CP and abnormal nuclear stress test and cath showed widely patent RCA stent with 30-50% distal stenosis before the takeoff of a PL and PDA and normal LVF.     Past Surgical History  Procedure Laterality Date  . Stents    . Nasal sinus surgery       Current Outpatient Prescriptions  Medication Sig Dispense Refill  . aspirin 81 MG tablet Take 81 mg by mouth daily.    . cetirizine-pseudoephedrine (ZYRTEC-D) 5-120 MG per  tablet Take 1 tablet by mouth 2 (two) times daily.    . clonazePAM (KLONOPIN) 0.5 MG tablet Take 0.5 mg by mouth at bedtime.    . famotidine (PEPCID) 20 MG tablet Take 1 tablet (20 mg total) by mouth 2 (two) times daily. 30 tablet 2  . fluticasone (FLONASE) 50 MCG/ACT nasal spray Place 1 spray into both nostrils daily as needed for allergies or rhinitis.    . Methylfol-Methylcob-Acetylcyst (CEREFOLIN NAC) 6-2-600 MG TABS Take one tablet by mouth one time daily 90 each 0  . omeprazole (PRILOSEC) 20 MG capsule Take 1 capsule (20 mg total) by mouth daily. 30 capsule 5  . polyethylene glycol (MIRALAX / GLYCOLAX) packet Take 17 g by mouth daily as needed.    . valsartan (DIOVAN) 160 MG tablet Take 1 tablet (160 mg total) by mouth daily. 30 tablet 11   No current facility-administered medications for this visit.    Allergies:   Review of patient's allergies indicates no known allergies.    Social History:  The patient  reports that he quit smoking about 50 years ago. His smoking use included Cigarettes. He has a 45 pack-year smoking history. He does not have any smokeless tobacco history on file. He reports that he  drinks alcohol. He reports that he does not use illicit drugs.   Family History:  The patient's family history includes Asthma in his mother; Cancer - Prostate in his father.    ROS:  Please see the history of present illness.   Otherwise, review of systems are positive for none.   All other systems are reviewed and negative.    PHYSICAL EXAM: VS:  BP 114/90 mmHg  Pulse 89  Ht 5' 8.5" (1.74 m)  Wt 192 lb 3.2 oz (87.181 kg)  BMI 28.80 kg/m2 , BMI Body mass index is 28.8 kg/(m^2). GEN: Well nourished, well developed, in no acute distress HEENT: normal Neck: no JVD, carotid bruits, or masses Cardiac: RRR; no murmurs, rubs, or gallops,no edema  Respiratory:  clear to auscultation bilaterally, normal work of breathing GI: soft, nontender, nondistended, + BS MS: no deformity or  atrophy Skin: warm and dry, no rash Neuro:  Strength and sensation are intact Psych: euthymic mood, full affect   EKG:  EKG is ordered today. The ekg ordered today demonstrates NSR with LAD, old inferoposterior infarct with nonspecific T wave abnormality   Recent Labs: 03/11/2014: BUN 14; Creatinine 1.30; Hemoglobin 14.4; Platelets 176.0; Potassium 4.1; Pro B Natriuretic peptide (BNP) 67.0; Sodium 138 04/08/2014: ALT 21    Lipid Panel    Component Value Date/Time   CHOL 178 04/08/2014 1159   TRIG 152.0* 04/08/2014 1159   HDL 40.90 04/08/2014 1159   CHOLHDL 4 04/08/2014 1159   VLDL 30.4 04/08/2014 1159   LDLCALC 107* 04/08/2014 1159      Wt Readings from Last 3 Encounters:  04/08/14 186 lb (84.369 kg)  04/05/14 192 lb 3.2 oz (87.181 kg)  03/11/14 191 lb 9.6 oz (86.909 kg)       ASSESSMENT AND PLAN:  1.  ASCAD with remote MI s/p PCI of RCA with patent stent by cath 2009.  He denies any angina but has had recent DOE.  Need to rule out ischemia prior to surgery.  Continue ASA. 2.  HTN - controlled.  Continue Diovan 3.  Dyslipidemia - he is not on a statin.  Will check an FLP and ALT   Current medicines are reviewed at length with the patient today.  The patient does not have concerns regarding medicines.  The following changes have been made:  no change  Labs/ tests ordered today include: Lexiscan myoview, FLP and ALT  Orders Placed This Encounter  Procedures  . Hepatic function panel  . Lipid panel  . Myocardial Perfusion Imaging  . EKG 12-Lead     Disposition:   FU with me in 1 year   Signed, Quintella ReichertURNER,Astria Jordahl R, MD  04/12/2014 9:23 AM    [facility] [address], [place], [place]  [address] Phone: [phone]; Fax: [phone]

## 2014-04-05 NOTE — Patient Instructions (Addendum)
Your physician has requested that you have a lexiscan myoview. For further information please visit [email or web address]. Please follow instruction sheet, as given.  Your physician recommends that you return for FASTING lab work the same day as your stress test. (LFTs, Lipids).  Your physician wants you to follow-up in: 1 year with Dr. [name]. You will receive a reminder letter in the mail two months in advance. If you don't receive a letter, please call our office to schedule the follow-up appointment.

## 2014-04-07 ENCOUNTER — Other Ambulatory Visit: Payer: Commercial Managed Care - HMO

## 2014-04-07 ENCOUNTER — Encounter (HOSPITAL_COMMUNITY): Payer: Commercial Managed Care - HMO

## 2014-04-08 ENCOUNTER — Ambulatory Visit (HOSPITAL_COMMUNITY): Payer: Commercial Managed Care - HMO | Attending: Internal Medicine | Admitting: Radiology

## 2014-04-08 ENCOUNTER — Other Ambulatory Visit: Payer: Self-pay | Admitting: Internal Medicine

## 2014-04-08 ENCOUNTER — Other Ambulatory Visit (INDEPENDENT_AMBULATORY_CARE_PROVIDER_SITE_OTHER): Payer: Commercial Managed Care - HMO | Admitting: *Deleted

## 2014-04-08 DIAGNOSIS — Z01818 Encounter for other preprocedural examination: Secondary | ICD-10-CM

## 2014-04-08 DIAGNOSIS — E785 Hyperlipidemia, unspecified: Secondary | ICD-10-CM | POA: Diagnosis not present

## 2014-04-08 DIAGNOSIS — I2510 Atherosclerotic heart disease of native coronary artery without angina pectoris: Secondary | ICD-10-CM

## 2014-04-08 DIAGNOSIS — I10 Essential (primary) hypertension: Secondary | ICD-10-CM | POA: Diagnosis not present

## 2014-04-08 DIAGNOSIS — J449 Chronic obstructive pulmonary disease, unspecified: Secondary | ICD-10-CM | POA: Diagnosis not present

## 2014-04-08 DIAGNOSIS — R0609 Other forms of dyspnea: Secondary | ICD-10-CM | POA: Insufficient documentation

## 2014-04-08 DIAGNOSIS — R0602 Shortness of breath: Secondary | ICD-10-CM

## 2014-04-08 DIAGNOSIS — R0600 Dyspnea, unspecified: Secondary | ICD-10-CM

## 2014-04-08 LAB — HEPATIC FUNCTION PANEL
ALK PHOS: 67 U/L (ref 39–117)
ALT: 21 U/L (ref 0–53)
AST: 22 U/L (ref 0–37)
Albumin: 3.9 g/dL (ref 3.5–5.2)
Bilirubin, Direct: 0.2 mg/dL (ref 0.0–0.3)
TOTAL PROTEIN: 6.5 g/dL (ref 6.0–8.3)
Total Bilirubin: 1.1 mg/dL (ref 0.2–1.2)

## 2014-04-08 LAB — LIPID PANEL
Cholesterol: 178 mg/dL (ref 0–200)
HDL: 40.9 mg/dL (ref >39.00)
LDL Cholesterol: 107 mg/dL — ABNORMAL HIGH (ref 0–99)
NonHDL: 137.1
Total CHOL/HDL Ratio: 4
Triglycerides: 152 mg/dL — ABNORMAL HIGH (ref 0.0–149.0)
VLDL: 30.4 mg/dL (ref 0.0–40.0)

## 2014-04-08 MED ORDER — TECHNETIUM TC 99M SESTAMIBI GENERIC - CARDIOLITE
11.0000 | Freq: Once | INTRAVENOUS | Status: AC | PRN
  Administered 2014-04-08: 11 via INTRAVENOUS

## 2014-04-08 MED ORDER — TECHNETIUM TC 99M SESTAMIBI GENERIC - CARDIOLITE
33.0000 | Freq: Once | INTRAVENOUS | Status: AC | PRN
  Administered 2014-04-08: 33 via INTRAVENOUS

## 2014-04-08 MED ORDER — REGADENOSON 0.4 MG/5ML IV SOLN
0.4000 mg | Freq: Once | INTRAVENOUS | Status: AC
  Administered 2014-04-08: 0.4 mg via INTRAVENOUS

## 2014-04-08 NOTE — Progress Notes (Signed)
[facility] SITE 3 NUCLEAR MED [address] [place], [place] [address] [phone]    Cardiology Nuclear Med Study  Veneta PentonRalph [name] is a 81 y.o. male     MRN : [id number]     DOB: 03/14/1931  Procedure Date: 04/08/2014  Nuclear Med Background Indication for Stress Test:  Evaluation for Ischemia, Follow up CAD, and Pending Surgical Clearance for  (R) Shoulder by Francena HanlyKevin Supple, MD History:  COPD and CAD- 2007 MPI:  Cardiac Risk Factors: Hypertension  Symptoms:  DOE   Nuclear Pre-Procedure Caffeine/Decaff Intake:  None> 12 hrs NPO After: 8:00pm   Lungs:  clear O2 Sat: 97% on room air. IV 0.9% NS with Angio Cath:  22g  IV Site: R Antecubital x 1, tolerated well IV Started by:  Irean HongPatsy Edwards, RN  Chest Size (in):  44 Cup Size: n/a  Height: 5\' 9"  (1.753 m)  Weight:  186 lb (84.369 kg)  BMI:  Body mass index is 27.45 kg/(m^2). Tech Comments:  Patient took Diovan this am. Irean HongPatsy Edwards, RN.    Nuclear Med Study 1 or 2 day study: 1 day  Stress Test Type:  Lexiscan  Reading MD: N/A  Order Authorizing Provider:  Armanda Magicraci Hogsett, MD  Resting Radionuclide: Technetium 3170m Sestamibi  Resting Radionuclide Dose:  mCi   Stress Radionuclide:  Technetium 5170m Sestamibi  Stress Radionuclide Dose:  mCi           Stress Protocol Rest HR: 87 Stress HR: 96  Rest BP: 110/72 Stress BP: 112/74  Exercise Time (min): n/a METS: n/a   Predicted Max HR: 140 bpm % Max HR: 68.57 bpm Rate Pressure Product: [id number]   Dose of Adenosine (mg):  n/a Dose of Lexiscan: 0.4 mg  Dose of Atropine (mg): n/a Dose of Dobutamine: n/a mcg/kg/min (at max HR)  Stress Test Technologist: Milana NaSabrina Williams, EMT-P  Nuclear Technologist:        Rest Procedure:  Myocardial perfusion imaging was performed at rest 45 minutes following the intravenous administration of Technetium 8170m Sestamibi. Rest ECG: NSR, old inferior MI  Stress Procedure:  The patient received IV Lexiscan 0.4 mg over 15-seconds.  Technetium 970m  Sestamibi injected at 30-seconds. This patient was sob with the Lexiscan injection. Quantitative spect images were obtained after a 45 minute delay. Stress ECG: No significant change from baseline ECG and There are scattered PACs.  QPS Raw Data Images:  Normal; no motion artifact; normal heart/lung ratio. Stress Images:  There is decreased uptake in the inferior wall. The defect is moderate in size and severity. Rest Images:  Comparison with the stress images reveals no significant change. Subtraction (SDS):  There is a fixed defect that is most consistent with a previous infarction. Transient Ischemic Dilatation (Normal <1.22):   Lung/Heart Ratio (Normal <0.45):    Quantitative Gated Spect Images QGS EDV:  105 ml QGS ESV:  12 ml  Impression Exercise Capacity:  Lexiscan with no exercise. BP Response:  Normal blood pressure response. Clinical Symptoms:  No significant symptoms noted. ECG Impression:  No significant ECG changes with Lexiscan. Comparison with Prior Nuclear Study: No images to compare  Overall Impression:  Low risk stress nuclear study with a moderate size fixed inferior defect consistent with previous infarction.  LV Ejection Fraction: 54%.  LV Wall Motion:  mild inferior hypokinesis   Douglas FairMihai Rodolfo Notaro, MD, [facility] [phone] office [phone] pager

## 2014-04-09 ENCOUNTER — Ambulatory Visit: Payer: Commercial Managed Care - HMO | Admitting: Internal Medicine

## 2014-04-12 ENCOUNTER — Encounter: Payer: Self-pay | Admitting: Cardiology

## 2014-04-13 ENCOUNTER — Telehealth: Payer: Self-pay

## 2014-04-13 ENCOUNTER — Telehealth: Payer: Self-pay | Admitting: Cardiology

## 2014-04-13 DIAGNOSIS — E785 Hyperlipidemia, unspecified: Secondary | ICD-10-CM

## 2014-04-13 MED ORDER — ATORVASTATIN CALCIUM 20 MG PO TABS: 20.0000 mg | ORAL_TABLET | Freq: Every day | ORAL | Status: DC

## 2014-04-13 NOTE — Telephone Encounter (Signed)
Received request from Nurse fax box, documents faxed for surgical clearance. To: [facility] Fax number: [phone] Attention: 3.8.16/km

## 2014-04-13 NOTE — Telephone Encounter (Signed)
Patient informed of results and verbal understanding expressed.   Instructed patient to START LIPITOR 20 mg daily. FLP and ALT scheduled for April 25. Patient agrees with treatment plan.

## 2014-04-13 NOTE — Telephone Encounter (Signed)
-----   Message from Quintella Reichertraci R Endres, MD sent at 04/09/2014  3:25 PM EST ----- LDL not at goal of < 70 - please have patient start Lipitor 20mg  daily and recheck FLP and ALT in 6 weeks

## 2014-04-16 DIAGNOSIS — R197 Diarrhea, unspecified: Secondary | ICD-10-CM | POA: Diagnosis not present

## 2014-04-21 ENCOUNTER — Encounter (HOSPITAL_COMMUNITY): Payer: Self-pay

## 2014-04-21 ENCOUNTER — Encounter (HOSPITAL_COMMUNITY)
Admission: RE | Admit: 2014-04-21 | Discharge: 2014-04-21 | Disposition: A | Discharge status: Home / Self Care | Payer: Commercial Managed Care - HMO | Source: Ambulatory Visit | Attending: Orthopedic Surgery | Admitting: Orthopedic Surgery

## 2014-04-21 ENCOUNTER — Other Ambulatory Visit: Payer: Self-pay | Admitting: Internal Medicine

## 2014-04-21 DIAGNOSIS — Z01818 Encounter for other preprocedural examination: Secondary | ICD-10-CM | POA: Diagnosis not present

## 2014-04-21 DIAGNOSIS — I2510 Atherosclerotic heart disease of native coronary artery without angina pectoris: Secondary | ICD-10-CM | POA: Diagnosis not present

## 2014-04-21 DIAGNOSIS — E538 Deficiency of other specified B group vitamins: Secondary | ICD-10-CM | POA: Insufficient documentation

## 2014-04-21 DIAGNOSIS — M19011 Primary osteoarthritis, right shoulder: Secondary | ICD-10-CM | POA: Diagnosis not present

## 2014-04-21 DIAGNOSIS — Z87891 Personal history of nicotine dependence: Secondary | ICD-10-CM | POA: Diagnosis not present

## 2014-04-21 DIAGNOSIS — I10 Essential (primary) hypertension: Secondary | ICD-10-CM | POA: Diagnosis not present

## 2014-04-21 DIAGNOSIS — E785 Hyperlipidemia, unspecified: Secondary | ICD-10-CM | POA: Insufficient documentation

## 2014-04-21 DIAGNOSIS — I252 Old myocardial infarction: Secondary | ICD-10-CM | POA: Diagnosis not present

## 2014-04-21 DIAGNOSIS — Z01812 Encounter for preprocedural laboratory examination: Secondary | ICD-10-CM | POA: Diagnosis not present

## 2014-04-21 HISTORY — DX: Gastro-esophageal reflux disease without esophagitis: K21.9

## 2014-04-21 HISTORY — DX: Pain in unspecified joint: M25.50

## 2014-04-21 HISTORY — DX: Constipation, unspecified: K59.00

## 2014-04-21 HISTORY — DX: Unspecified osteoarthritis, unspecified site: M19.90

## 2014-04-21 HISTORY — DX: Hyperlipidemia, unspecified: E78.5

## 2014-04-21 HISTORY — DX: Personal history of other diseases of the respiratory system: Z87.09

## 2014-04-21 LAB — COMPREHENSIVE METABOLIC PANEL
ALT: 24 U/L (ref 0–53)
AST: 30 U/L (ref 0–37)
Albumin: 3.3 g/dL — ABNORMAL LOW (ref 3.5–5.2)
Alkaline Phosphatase: 94 U/L (ref 39–117)
Anion gap: 10 (ref 5–15)
BILIRUBIN TOTAL: 0.5 mg/dL (ref 0.3–1.2)
BUN: 11 mg/dL (ref 6–23)
CO2: 24 mmol/L (ref 19–32)
CREATININE: 1.32 mg/dL (ref 0.50–1.35)
Calcium: 9.1 mg/dL (ref 8.4–10.5)
Chloride: 103 mmol/L (ref 96–112)
GFR calc Af Amer: 57 mL/min — ABNORMAL LOW (ref >90)
GFR calc non Af Amer: 49 mL/min — ABNORMAL LOW (ref >90)
Glucose, Bld: 113 mg/dL — ABNORMAL HIGH (ref 70–99)
POTASSIUM: 3.9 mmol/L (ref 3.5–5.1)
SODIUM: 137 mmol/L (ref 135–145)
Total Protein: 5.7 g/dL — ABNORMAL LOW (ref 6.0–8.3)

## 2014-04-21 LAB — CBC WITH DIFFERENTIAL/PLATELET
BASOS PCT: 0 % (ref 0–1)
Basophils Absolute: 0 10*3/uL (ref 0.0–0.1)
Eosinophils Absolute: 0.4 10*3/uL (ref 0.0–0.7)
Eosinophils Relative: 7 % — ABNORMAL HIGH (ref 0–5)
HCT: 37.9 % — ABNORMAL LOW (ref 39.0–52.0)
HEMOGLOBIN: 13 g/dL (ref 13.0–17.0)
Lymphocytes Relative: 29 % (ref 12–46)
Lymphs Abs: 1.4 10*3/uL (ref 0.7–4.0)
MCH: 32.4 pg (ref 26.0–34.0)
MCHC: 34.3 g/dL (ref 30.0–36.0)
MCV: 94.5 fL (ref 78.0–100.0)
MONO ABS: 0.6 10*3/uL (ref 0.1–1.0)
MONOS PCT: 12 % (ref 3–12)
Neutro Abs: 2.4 10*3/uL (ref 1.7–7.7)
Neutrophils Relative %: 52 % (ref 43–77)
Platelets: 150 10*3/uL (ref 150–400)
RBC: 4.01 MIL/uL — ABNORMAL LOW (ref 4.22–5.81)
RDW: 12.7 % (ref 11.5–15.5)
WBC: 4.8 10*3/uL (ref 4.0–10.5)

## 2014-04-21 LAB — SURGICAL PCR SCREEN
MRSA, PCR: NEGATIVE
Staphylococcus aureus: NEGATIVE

## 2014-04-21 LAB — APTT: aPTT: 32 seconds (ref 24–37)

## 2014-04-21 LAB — PROTIME-INR
INR: 1.08 (ref 0.00–1.49)
Prothrombin Time: 14.1 seconds (ref 11.6–15.2)

## 2014-04-21 MED ORDER — OMEPRAZOLE 20 MG PO CPDR: 20.0000 mg | DELAYED_RELEASE_CAPSULE | Freq: Every day | ORAL | Status: DC

## 2014-04-21 MED ORDER — VALSARTAN 160 MG PO TABS: 160.0000 mg | ORAL_TABLET | Freq: Every day | ORAL | Status: DC

## 2014-04-21 MED ORDER — CHLORHEXIDINE GLUCONATE 4 % EX LIQD: 60.0000 mL | Freq: Once | CUTANEOUS | Status: DC

## 2014-04-21 MED ORDER — FAMOTIDINE 20 MG PO TABS: 20.0000 mg | ORAL_TABLET | Freq: Two times a day (BID) | ORAL | Status: DC

## 2014-04-21 NOTE — Progress Notes (Signed)
   04/21/14 1540  OBSTRUCTIVE SLEEP APNEA  Have you ever been diagnosed with sleep apnea through a sleep study? No  Do you snore loudly (loud enough to be heard through closed doors)?  0  Do you often feel tired, fatigued, or sleepy during the daytime? 0  Has anyone observed you stop breathing during your sleep? 0  Do you have, or are you being treated for high blood pressure? 1  BMI more than 35 kg/m2? 0  Age over 79 years old? 1  Neck circumference greater than 40 cm/16 inches? 1  Gender: 1

## 2014-04-21 NOTE — Pre-Procedure Instructions (Signed)
[name]  04/21/2014   Your procedure is scheduled on:  Thurs, Mar 24 @ 7:30 AM  [facility] AM.  Call this number if you have problems the morning of surgery: [phone]   Remember:   Do not eat food or drink liquids after midnight.   Take these medicines the morning of surgery with A SIP OF WATER: Zyrtec(Cetirizine),Famotidine(Pepcid),Flonase(Fluticasone),and Omeprazole(Prilosec)               Stop taking your Aspirin. No Goody's,BC's,Aleve,Ibuprofen,Fish Oil,or any Herbal Medications.    Do not wear jewelry.  Do not wear lotions, powders, or colognes. You may wear deodorant.  Men may shave face and neck.  Do not bring valuables to the hospital.  [facility] is not responsible                  for any belongings or valuables.               Contacts, dentures or bridgework may not be worn into surgery.  Leave suitcase in the car. After surgery it may be brought to your room.  For patients admitted to the hospital, discharge time is determined by your                treatment team.                 Special Instructions:  [place] - Preparing for Surgery  Before surgery, you can play an important role.  Because skin is not sterile, your skin needs to be as free of germs as possible.  You can reduce the number of germs on you skin by washing with CHG (chlorahexidine gluconate) soap before surgery.  CHG is an antiseptic cleaner which kills germs and bonds with the skin to continue killing germs even after washing.  Please DO NOT use if you have an allergy to CHG or antibacterial soaps.  If your skin becomes reddened/irritated stop using the CHG and inform your nurse when you arrive at Short Stay.  Do not shave (including legs and underarms) for at least 48 hours prior to the first CHG shower.  You may shave your face.  Please follow these instructions carefully:   1.  Shower with CHG Soap the night before surgery and the                                 morning of Surgery.  2.  If you choose to wash your hair, wash your hair first as usual with your       normal shampoo.  3.  After you shampoo, rinse your hair and body thoroughly to remove the                      Shampoo.  4.  Use CHG as you would any other liquid soap.  You can apply chg directly       to the skin and wash gently with scrungie or a clean washcloth.  5.  Apply the CHG Soap to your body ONLY FROM THE NECK DOWN.        Do not use on open wounds or open sores.  Avoid contact with your eyes,       ears, mouth and genitals (private parts).  Wash genitals (private parts)       with your normal soap.  6.  Wash thoroughly, paying special attention to the area where your surgery        will be performed.  7.  Thoroughly rinse your body with warm water from the neck down.  8.  DO NOT shower/wash with your normal soap after using and rinsing off       the CHG Soap.  9.  Pat yourself dry with a clean towel.            10.  Wear clean pajamas.            11.  Place clean sheets on your bed the night of your first shower and do not        sleep with pets.  Day of Surgery  Do not apply any lotions/deoderants the morning of surgery.  Please wear clean clothes to the hospital/surgery center.     Please read over the following fact sheets that you were given: Pain Booklet, Coughing and Deep Breathing, MRSA Information and Surgical Site Infection Prevention

## 2014-04-21 NOTE — Progress Notes (Signed)
Cardiologist is Dr.Traci Reining with last visit in epic  Echo report in epic from 2014  Stress test in epic from 2014/2016  Heart cath in epic from 2004  Medical MD is Dr.Brian Thea SilversmithMackenzie   EKG in epic from 04-05-14  CXR in epic from 03-2014

## 2014-04-22 NOTE — Progress Notes (Signed)
Anesthesia Chart Review:  Pt is 81 year old male scheduled for R total shoulder arthroplasty vs reverse total shoulder arthroplasty on 04/29/2014 with Dr. Rennis ChrisSupple.   PMH includes: MI, CAD (BMS to RCA 2004), hyperlipidemia, HTN, vitamin B12 deficiency. Former smoker. BMI 29.   Preoperative labs reviewed.   Chest x-ray 03/11/2014 reviewed. No acute cardiopulmonary disease.   EKG 04/05/2014: NSR. L axis deviation. Inferior infarct, age undetermined. Dr. Norris Crossurner's interpretation is that the infarct is old (see Epic notes).   Nuclear stress test 04/08/2014: Low risk stress nuclear study with a moderate size fixed inferior defect consistent with previous infarction. LV Ejection Fraction: 54%. LV Wall Motion: mild inferior hypokinesis. Dr. Norris Crossurner's interpretation is "old MI but normal blood flow everywhere else".   Echo 03/12/2012: 1. Mild concentric LVH 2. LV EF estimated by 2D at 55.5% 3. The aortic valve is sclerotic by opens well. 4. There is mild aortic root dilatation. 5. Trivial tricuspid regurgitation.  6. Analysis of mitral valve inflow, pulmonary vein Doppler and tissue Doppler suggests grade I diastolic dysfunction without elevated LA pressure.   Cardiac cath 05/29/2005: 1. Atherosclerotic cardiovascular disease, single vessel (RCA 30-50% distal stenosis). 2. No evidence of significant obstructive coronary disease. 3. Intact left ventricular size and global systolic function.  Given recent low risk stress test findings, if no changes I anticipate pt can proceed as scheduled.   Rica Mastngela Kabbe, FNP-BC [facility] Phone: [phone] 04/22/2014 4:28 PM

## 2014-04-23 DIAGNOSIS — R05 Cough: Secondary | ICD-10-CM | POA: Diagnosis not present

## 2014-04-23 DIAGNOSIS — J209 Acute bronchitis, unspecified: Secondary | ICD-10-CM | POA: Diagnosis not present

## 2014-04-28 MED ORDER — CEFAZOLIN SODIUM-DEXTROSE 2-3 GM-% IV SOLR
2.0000 g | INTRAVENOUS | Status: AC
  Administered 2014-04-29: 2 g via INTRAVENOUS
  Filled 2014-04-28: qty 50

## 2014-04-28 MED ORDER — LACTATED RINGERS IV SOLN
INTRAVENOUS | Status: DC
  Administered 2014-04-29 (×2): via INTRAVENOUS

## 2014-04-29 ENCOUNTER — Inpatient Hospital Stay (HOSPITAL_COMMUNITY)
Admission: RE | Admit: 2014-04-29 | Discharge: 2014-05-03 | DRG: 483 | Disposition: A | Discharge status: Skilled Nursing Facility | Payer: Commercial Managed Care - HMO | Source: Ambulatory Visit | Attending: Orthopedic Surgery | Admitting: Orthopedic Surgery

## 2014-04-29 ENCOUNTER — Encounter (HOSPITAL_COMMUNITY)
Admission: RE | Disposition: A | Discharge status: Skilled Nursing Facility | Payer: Self-pay | Source: Ambulatory Visit | Attending: Orthopedic Surgery

## 2014-04-29 ENCOUNTER — Inpatient Hospital Stay (HOSPITAL_COMMUNITY): Payer: Commercial Managed Care - HMO | Admitting: Emergency Medicine

## 2014-04-29 ENCOUNTER — Encounter (HOSPITAL_COMMUNITY): Payer: Self-pay | Admitting: *Deleted

## 2014-04-29 ENCOUNTER — Inpatient Hospital Stay (HOSPITAL_COMMUNITY): Payer: Commercial Managed Care - HMO | Admitting: Anesthesiology

## 2014-04-29 DIAGNOSIS — I10 Essential (primary) hypertension: Secondary | ICD-10-CM | POA: Diagnosis present

## 2014-04-29 DIAGNOSIS — Z471 Aftercare following joint replacement surgery: Secondary | ICD-10-CM | POA: Diagnosis not present

## 2014-04-29 DIAGNOSIS — G8918 Other acute postprocedural pain: Secondary | ICD-10-CM | POA: Diagnosis not present

## 2014-04-29 DIAGNOSIS — I2510 Atherosclerotic heart disease of native coronary artery without angina pectoris: Secondary | ICD-10-CM | POA: Diagnosis present

## 2014-04-29 DIAGNOSIS — G2581 Restless legs syndrome: Secondary | ICD-10-CM | POA: Diagnosis not present

## 2014-04-29 DIAGNOSIS — F0390 Unspecified dementia without behavioral disturbance: Secondary | ICD-10-CM | POA: Diagnosis present

## 2014-04-29 DIAGNOSIS — I252 Old myocardial infarction: Secondary | ICD-10-CM | POA: Diagnosis not present

## 2014-04-29 DIAGNOSIS — Z87891 Personal history of nicotine dependence: Secondary | ICD-10-CM | POA: Diagnosis not present

## 2014-04-29 DIAGNOSIS — S4991XA Unspecified injury of right shoulder and upper arm, initial encounter: Secondary | ICD-10-CM | POA: Diagnosis not present

## 2014-04-29 DIAGNOSIS — Z9119 Patient's noncompliance with other medical treatment and regimen: Secondary | ICD-10-CM | POA: Diagnosis present

## 2014-04-29 DIAGNOSIS — K219 Gastro-esophageal reflux disease without esophagitis: Secondary | ICD-10-CM | POA: Diagnosis present

## 2014-04-29 DIAGNOSIS — Z825 Family history of asthma and other chronic lower respiratory diseases: Secondary | ICD-10-CM

## 2014-04-29 DIAGNOSIS — E785 Hyperlipidemia, unspecified: Secondary | ICD-10-CM | POA: Diagnosis present

## 2014-04-29 DIAGNOSIS — M19011 Primary osteoarthritis, right shoulder: Secondary | ICD-10-CM | POA: Diagnosis not present

## 2014-04-29 DIAGNOSIS — T8389XA Other specified complication of genitourinary prosthetic devices, implants and grafts, initial encounter: Secondary | ICD-10-CM | POA: Diagnosis not present

## 2014-04-29 DIAGNOSIS — R310 Gross hematuria: Secondary | ICD-10-CM | POA: Diagnosis not present

## 2014-04-29 DIAGNOSIS — F418 Other specified anxiety disorders: Secondary | ICD-10-CM | POA: Diagnosis present

## 2014-04-29 DIAGNOSIS — N401 Enlarged prostate with lower urinary tract symptoms: Secondary | ICD-10-CM | POA: Diagnosis not present

## 2014-04-29 DIAGNOSIS — M25511 Pain in right shoulder: Secondary | ICD-10-CM | POA: Diagnosis not present

## 2014-04-29 DIAGNOSIS — R339 Retention of urine, unspecified: Secondary | ICD-10-CM | POA: Diagnosis not present

## 2014-04-29 DIAGNOSIS — M6281 Muscle weakness (generalized): Secondary | ICD-10-CM | POA: Diagnosis not present

## 2014-04-29 DIAGNOSIS — R338 Other retention of urine: Secondary | ICD-10-CM | POA: Diagnosis not present

## 2014-04-29 DIAGNOSIS — Z96611 Presence of right artificial shoulder joint: Secondary | ICD-10-CM | POA: Diagnosis not present

## 2014-04-29 DIAGNOSIS — R279 Unspecified lack of coordination: Secondary | ICD-10-CM | POA: Diagnosis not present

## 2014-04-29 DIAGNOSIS — Z96619 Presence of unspecified artificial shoulder joint: Secondary | ICD-10-CM

## 2014-04-29 DIAGNOSIS — R2681 Unsteadiness on feet: Secondary | ICD-10-CM | POA: Diagnosis not present

## 2014-04-29 HISTORY — PX: REVERSE SHOULDER ARTHROPLASTY: SHX5054

## 2014-04-29 SURGERY — ARTHROPLASTY, SHOULDER, TOTAL, REVERSE
Anesthesia: Regional | Site: Shoulder | Laterality: Right

## 2014-04-29 MED ORDER — KETOROLAC TROMETHAMINE 30 MG/ML IJ SOLN: 15.0000 mg | Freq: Once | INTRAMUSCULAR | Status: DC | PRN

## 2014-04-29 MED ORDER — PANTOPRAZOLE SODIUM 40 MG PO TBEC
40.0000 mg | DELAYED_RELEASE_TABLET | Freq: Every day | ORAL | Status: DC
  Administered 2014-04-29 – 2014-05-03 (×5): 40 mg via ORAL
  Filled 2014-04-29 (×5): qty 1

## 2014-04-29 MED ORDER — ONDANSETRON HCL 4 MG/2ML IJ SOLN
INTRAMUSCULAR | Status: DC | PRN
  Administered 2014-04-29: 4 mg via INTRAVENOUS

## 2014-04-29 MED ORDER — FENTANYL CITRATE 0.05 MG/ML IJ SOLN
INTRAMUSCULAR | Status: DC | PRN
  Administered 2014-04-29: 50 ug via INTRAVENOUS
  Administered 2014-04-29: 25 ug via INTRAVENOUS

## 2014-04-29 MED ORDER — PROMETHAZINE HCL 25 MG/ML IJ SOLN: 6.2500 mg | INTRAMUSCULAR | Status: DC | PRN

## 2014-04-29 MED ORDER — HYDROMORPHONE HCL 1 MG/ML IJ SOLN
0.5000 mg | INTRAMUSCULAR | Status: DC | PRN
  Filled 2014-04-29: qty 1

## 2014-04-29 MED ORDER — PROPOFOL 10 MG/ML IV BOLUS
INTRAVENOUS | Status: DC | PRN
  Administered 2014-04-29: 130 mg via INTRAVENOUS

## 2014-04-29 MED ORDER — GLYCOPYRROLATE 0.2 MG/ML IJ SOLN
INTRAMUSCULAR | Status: DC | PRN
  Administered 2014-04-29: 0.4 mg via INTRAVENOUS

## 2014-04-29 MED ORDER — IRBESARTAN 150 MG PO TABS
150.0000 mg | ORAL_TABLET | Freq: Every day | ORAL | Status: DC
  Administered 2014-04-29 – 2014-05-02 (×3): 150 mg via ORAL
  Filled 2014-04-29 (×4): qty 1

## 2014-04-29 MED ORDER — LACTATED RINGERS IV SOLN
INTRAVENOUS | Status: DC
  Administered 2014-04-29 (×2): via INTRAVENOUS

## 2014-04-29 MED ORDER — CEFAZOLIN SODIUM-DEXTROSE 2-3 GM-% IV SOLR
2.0000 g | Freq: Four times a day (QID) | INTRAVENOUS | Status: AC
  Administered 2014-04-29 – 2014-04-30 (×3): 2 g via INTRAVENOUS
  Filled 2014-04-29 (×3): qty 50

## 2014-04-29 MED ORDER — OXYCODONE-ACETAMINOPHEN 5-325 MG PO TABS: 1.0000 | ORAL_TABLET | ORAL | Status: DC | PRN

## 2014-04-29 MED ORDER — ONDANSETRON HCL 4 MG/2ML IJ SOLN
4.0000 mg | Freq: Four times a day (QID) | INTRAMUSCULAR | Status: DC | PRN
  Administered 2014-04-29: 4 mg via INTRAVENOUS
  Filled 2014-04-29: qty 2

## 2014-04-29 MED ORDER — LIDOCAINE HCL (CARDIAC) 20 MG/ML IV SOLN
INTRAVENOUS | Status: DC | PRN
  Administered 2014-04-29: 50 mg via INTRAVENOUS

## 2014-04-29 MED ORDER — FENTANYL CITRATE 0.05 MG/ML IJ SOLN
INTRAMUSCULAR | Status: AC
  Filled 2014-04-29: qty 5

## 2014-04-29 MED ORDER — ALUM & MAG HYDROXIDE-SIMETH 200-200-20 MG/5ML PO SUSP
30.0000 mL | ORAL | Status: DC | PRN
  Administered 2014-04-29: 30 mL via ORAL
  Filled 2014-04-29: qty 30

## 2014-04-29 MED ORDER — EPHEDRINE SULFATE 50 MG/ML IJ SOLN
INTRAMUSCULAR | Status: AC
  Filled 2014-04-29: qty 1

## 2014-04-29 MED ORDER — PROPOFOL 10 MG/ML IV BOLUS
INTRAVENOUS | Status: AC
  Filled 2014-04-29: qty 20

## 2014-04-29 MED ORDER — METOCLOPRAMIDE HCL 5 MG/ML IJ SOLN: 5.0000 mg | Freq: Three times a day (TID) | INTRAMUSCULAR | Status: DC | PRN

## 2014-04-29 MED ORDER — ASPIRIN EC 81 MG PO TBEC
81.0000 mg | DELAYED_RELEASE_TABLET | Freq: Every day | ORAL | Status: DC
  Administered 2014-04-29 – 2014-05-03 (×5): 81 mg via ORAL
  Filled 2014-04-29 (×9): qty 1

## 2014-04-29 MED ORDER — SUCCINYLCHOLINE CHLORIDE 20 MG/ML IJ SOLN
INTRAMUSCULAR | Status: AC
  Filled 2014-04-29: qty 1

## 2014-04-29 MED ORDER — ATORVASTATIN CALCIUM 10 MG PO TABS
20.0000 mg | ORAL_TABLET | Freq: Every day | ORAL | Status: DC
  Administered 2014-04-29 – 2014-05-03 (×5): 20 mg via ORAL
  Filled 2014-04-29 (×5): qty 2

## 2014-04-29 MED ORDER — LIDOCAINE HCL (CARDIAC) 20 MG/ML IV SOLN
INTRAVENOUS | Status: AC
  Filled 2014-04-29: qty 5

## 2014-04-29 MED ORDER — ONDANSETRON HCL 4 MG PO TABS
4.0000 mg | ORAL_TABLET | Freq: Four times a day (QID) | ORAL | Status: DC | PRN
  Filled 2014-04-29: qty 1

## 2014-04-29 MED ORDER — MAGNESIUM CITRATE PO SOLN: 1.0000 | Freq: Once | ORAL | Status: AC | PRN

## 2014-04-29 MED ORDER — DOCUSATE SODIUM 100 MG PO CAPS
100.0000 mg | ORAL_CAPSULE | Freq: Two times a day (BID) | ORAL | Status: DC
  Administered 2014-04-29 – 2014-05-03 (×9): 100 mg via ORAL
  Filled 2014-04-29 (×10): qty 1

## 2014-04-29 MED ORDER — ROCURONIUM BROMIDE 100 MG/10ML IV SOLN
INTRAVENOUS | Status: DC | PRN
  Administered 2014-04-29: 40 mg via INTRAVENOUS

## 2014-04-29 MED ORDER — ROCURONIUM BROMIDE 50 MG/5ML IV SOLN
INTRAVENOUS | Status: AC
  Filled 2014-04-29: qty 1

## 2014-04-29 MED ORDER — MEPERIDINE HCL 25 MG/ML IJ SOLN: 6.2500 mg | INTRAMUSCULAR | Status: DC | PRN

## 2014-04-29 MED ORDER — ACETAMINOPHEN 650 MG RE SUPP: 650.0000 mg | Freq: Four times a day (QID) | RECTAL | Status: DC | PRN

## 2014-04-29 MED ORDER — SODIUM CHLORIDE 0.9 % IV SOLN
10.0000 mg | INTRAVENOUS | Status: DC | PRN
  Administered 2014-04-29: 10 ug/min via INTRAVENOUS

## 2014-04-29 MED ORDER — NEOSTIGMINE METHYLSULFATE 10 MG/10ML IV SOLN
INTRAVENOUS | Status: DC | PRN
  Administered 2014-04-29: 3 mg via INTRAVENOUS

## 2014-04-29 MED ORDER — SODIUM CHLORIDE 0.9 % IJ SOLN
INTRAMUSCULAR | Status: AC
  Filled 2014-04-29: qty 10

## 2014-04-29 MED ORDER — GLYCOPYRROLATE 0.2 MG/ML IJ SOLN
INTRAMUSCULAR | Status: AC
  Filled 2014-04-29: qty 2

## 2014-04-29 MED ORDER — FLUTICASONE PROPIONATE 50 MCG/ACT NA SUSP: 1.0000 | Freq: Every day | NASAL | Status: DC | PRN

## 2014-04-29 MED ORDER — BUPIVACAINE-EPINEPHRINE (PF) 0.5% -1:200000 IJ SOLN
INTRAMUSCULAR | Status: DC | PRN
  Administered 2014-04-29: 20 mL via PERINEURAL

## 2014-04-29 MED ORDER — DIAZEPAM 5 MG PO TABS
2.5000 mg | ORAL_TABLET | Freq: Four times a day (QID) | ORAL | Status: DC | PRN
  Administered 2014-04-29: 5 mg via ORAL
  Administered 2014-04-30: 2.5 mg via ORAL
  Administered 2014-05-01 – 2014-05-02 (×2): 5 mg via ORAL
  Filled 2014-04-29 (×4): qty 1

## 2014-04-29 MED ORDER — METOCLOPRAMIDE HCL 5 MG PO TABS: 5.0000 mg | ORAL_TABLET | Freq: Three times a day (TID) | ORAL | Status: DC | PRN

## 2014-04-29 MED ORDER — KETOROLAC TROMETHAMINE 15 MG/ML IJ SOLN
INTRAMUSCULAR | Status: AC
  Filled 2014-04-29: qty 1

## 2014-04-29 MED ORDER — LORATADINE 10 MG PO TABS
10.0000 mg | ORAL_TABLET | Freq: Every day | ORAL | Status: DC
  Administered 2014-04-29 – 2014-05-03 (×5): 10 mg via ORAL
  Filled 2014-04-29 (×5): qty 1

## 2014-04-29 MED ORDER — PHENOL 1.4 % MT LIQD: 1.0000 | OROMUCOSAL | Status: DC | PRN

## 2014-04-29 MED ORDER — ONDANSETRON HCL 4 MG/2ML IJ SOLN
INTRAMUSCULAR | Status: AC
  Filled 2014-04-29: qty 2

## 2014-04-29 MED ORDER — FAMOTIDINE 20 MG PO TABS
20.0000 mg | ORAL_TABLET | Freq: Two times a day (BID) | ORAL | Status: DC
  Administered 2014-04-29 – 2014-05-03 (×9): 20 mg via ORAL
  Filled 2014-04-29 (×9): qty 1

## 2014-04-29 MED ORDER — POLYETHYLENE GLYCOL 3350 17 G PO PACK: 17.0000 g | PACK | Freq: Every day | ORAL | Status: DC | PRN

## 2014-04-29 MED ORDER — SODIUM CHLORIDE 0.9 % IR SOLN
Status: DC | PRN
  Administered 2014-04-29: 1000 mL

## 2014-04-29 MED ORDER — GLYCOPYRROLATE 0.2 MG/ML IJ SOLN
INTRAMUSCULAR | Status: AC
  Filled 2014-04-29: qty 1

## 2014-04-29 MED ORDER — MENTHOL 3 MG MT LOZG: 1.0000 | LOZENGE | OROMUCOSAL | Status: DC | PRN

## 2014-04-29 MED ORDER — BISACODYL 5 MG PO TBEC
5.0000 mg | DELAYED_RELEASE_TABLET | Freq: Every day | ORAL | Status: DC | PRN
  Administered 2014-05-03: 5 mg via ORAL
  Filled 2014-04-29: qty 1

## 2014-04-29 MED ORDER — OXYCODONE HCL 5 MG PO TABS
5.0000 mg | ORAL_TABLET | ORAL | Status: DC | PRN
  Administered 2014-04-29 – 2014-04-30 (×3): 5 mg via ORAL
  Administered 2014-04-30 – 2014-05-01 (×4): 10 mg via ORAL
  Administered 2014-05-01: 5 mg via ORAL
  Administered 2014-05-02 – 2014-05-03 (×6): 10 mg via ORAL
  Filled 2014-04-29 (×2): qty 2
  Filled 2014-04-29: qty 1
  Filled 2014-04-29: qty 2
  Filled 2014-04-29: qty 1
  Filled 2014-04-29 (×5): qty 2
  Filled 2014-04-29 (×2): qty 1
  Filled 2014-04-29 (×2): qty 2

## 2014-04-29 MED ORDER — DIAZEPAM 5 MG PO TABS: 2.5000 mg | ORAL_TABLET | Freq: Four times a day (QID) | ORAL | Status: DC | PRN

## 2014-04-29 MED ORDER — HYDROMORPHONE HCL 1 MG/ML IJ SOLN
0.2500 mg | INTRAMUSCULAR | Status: DC | PRN
  Administered 2014-04-29: 0.5 mg via INTRAVENOUS

## 2014-04-29 MED ORDER — HYDROMORPHONE HCL 1 MG/ML IJ SOLN
INTRAMUSCULAR | Status: AC
Start: 2014-04-29 — End: 2014-04-29
  Filled 2014-04-29: qty 1

## 2014-04-29 MED ORDER — ACETAMINOPHEN 325 MG PO TABS
650.0000 mg | ORAL_TABLET | Freq: Four times a day (QID) | ORAL | Status: DC | PRN
  Administered 2014-04-30 – 2014-05-01 (×3): 650 mg via ORAL
  Filled 2014-04-29 (×4): qty 2

## 2014-04-29 SURGICAL SUPPLY — 67 items
BIT DRILL 170X2.5X (BIT) IMPLANT
BIT DRL 170X2.5X (BIT)
BLADE SAW SGTL 83.5X18.5 (BLADE) ×3 IMPLANT
BRUSH FEMORAL CANAL (MISCELLANEOUS) IMPLANT
CAPT SHLDR REVTOTAL 1 ×3 IMPLANT
COVER SURGICAL LIGHT HANDLE (MISCELLANEOUS) ×3 IMPLANT
DERMABOND ADVANCED (GAUZE/BANDAGES/DRESSINGS) ×2
DERMABOND ADVANCED .7 DNX12 (GAUZE/BANDAGES/DRESSINGS) ×1 IMPLANT
DRAPE SURG 17X11 SM STRL (DRAPES) ×3 IMPLANT
DRAPE U-SHAPE 47X51 STRL (DRAPES) ×3 IMPLANT
DRILL 2.5 (BIT)
DRILL BIT 7/64X5 (BIT) ×3 IMPLANT
DRSG AQUACEL AG ADV 3.5X10 (GAUZE/BANDAGES/DRESSINGS) ×3 IMPLANT
DRSG MEPILEX BORDER 4X8 (GAUZE/BANDAGES/DRESSINGS) IMPLANT
DURAPREP 26ML APPLICATOR (WOUND CARE) ×3 IMPLANT
ELECT BLADE 4.0 EZ CLEAN MEGAD (MISCELLANEOUS) ×3
ELECT CAUTERY BLADE 6.4 (BLADE) ×3 IMPLANT
ELECT REM PT RETURN 9FT ADLT (ELECTROSURGICAL) ×3
ELECTRODE BLDE 4.0 EZ CLN MEGD (MISCELLANEOUS) ×1 IMPLANT
ELECTRODE REM PT RTRN 9FT ADLT (ELECTROSURGICAL) ×1 IMPLANT
FACESHIELD WRAPAROUND (MASK) ×9 IMPLANT
GLOVE BIO SURGEON STRL SZ7.5 (GLOVE) ×3 IMPLANT
GLOVE BIO SURGEON STRL SZ8 (GLOVE) ×3 IMPLANT
GLOVE EUDERMIC 7 POWDERFREE (GLOVE) ×3 IMPLANT
GLOVE SS BIOGEL STRL SZ 7.5 (GLOVE) ×1 IMPLANT
GLOVE SUPERSENSE BIOGEL SZ 7.5 (GLOVE) ×2
GOWN STRL REUS W/ TWL LRG LVL3 (GOWN DISPOSABLE) IMPLANT
GOWN STRL REUS W/ TWL XL LVL3 (GOWN DISPOSABLE) ×2 IMPLANT
GOWN STRL REUS W/TWL LRG LVL3 (GOWN DISPOSABLE)
GOWN STRL REUS W/TWL XL LVL3 (GOWN DISPOSABLE) ×4
HANDPIECE INTERPULSE COAX TIP (DISPOSABLE)
KIT BASIN OR (CUSTOM PROCEDURE TRAY) ×3 IMPLANT
KIT ROOM TURNOVER OR (KITS) ×3 IMPLANT
MANIFOLD NEPTUNE II (INSTRUMENTS) ×3 IMPLANT
NDL SUT 6 .5 CRC .975X.05 MAYO (NEEDLE) IMPLANT
NEEDLE HYPO 25GX1X1/2 BEV (NEEDLE) IMPLANT
NEEDLE MAYO TAPER (NEEDLE)
NS IRRIG 1000ML POUR BTL (IV SOLUTION) ×3 IMPLANT
PACK SHOULDER (CUSTOM PROCEDURE TRAY) ×3 IMPLANT
PAD ARMBOARD 7.5X6 YLW CONV (MISCELLANEOUS) ×6 IMPLANT
PASSER SUT SWANSON 36MM LOOP (INSTRUMENTS) IMPLANT
PIN GUIDE 1.2 (PIN) IMPLANT
PIN GUIDE GLENOPHERE 1.5MX300M (PIN) IMPLANT
PIN METAGLENE 2.5 (PIN) IMPLANT
PRESSURIZER FEMORAL UNIV (MISCELLANEOUS) IMPLANT
SET HNDPC FAN SPRY TIP SCT (DISPOSABLE) IMPLANT
SLING ARM FOAM STRAP LRG (SOFTGOODS) IMPLANT
SPONGE LAP 18X18 X RAY DECT (DISPOSABLE) ×6 IMPLANT
SPONGE LAP 4X18 X RAY DECT (DISPOSABLE) IMPLANT
SUCTION FRAZIER TIP 10 FR DISP (SUCTIONS) ×3 IMPLANT
SUT BONE WAX W31G (SUTURE) IMPLANT
SUT FIBERWIRE #2 38 T-5 BLUE (SUTURE) ×6
SUT MNCRL AB 3-0 PS2 18 (SUTURE) ×3 IMPLANT
SUT VIC AB 1 CT1 27 (SUTURE) ×2
SUT VIC AB 1 CT1 27XBRD ANBCTR (SUTURE) ×1 IMPLANT
SUT VIC AB 2-0 CT1 27 (SUTURE) ×2
SUT VIC AB 2-0 CT1 TAPERPNT 27 (SUTURE) ×1 IMPLANT
SUT VIC AB 2-0 SH 27 (SUTURE)
SUT VIC AB 2-0 SH 27X BRD (SUTURE) IMPLANT
SUTURE FIBERWR #2 38 T-5 BLUE (SUTURE) ×2 IMPLANT
SYR 30ML SLIP (SYRINGE) ×3 IMPLANT
SYR CONTROL 10ML LL (SYRINGE) IMPLANT
TOWEL OR 17X24 6PK STRL BLUE (TOWEL DISPOSABLE) ×3 IMPLANT
TOWEL OR 17X26 10 PK STRL BLUE (TOWEL DISPOSABLE) ×3 IMPLANT
TOWER CARTRIDGE SMART MIX (DISPOSABLE) IMPLANT
TRAY FOLEY CATH 16FRSI W/METER (SET/KITS/TRAYS/PACK) IMPLANT
WATER STERILE IRR 1000ML POUR (IV SOLUTION) ×3 IMPLANT

## 2014-04-29 NOTE — Op Note (Signed)
04/29/2014  9:30 AM  PATIENT:   [name]  81 y.o. male  PRE-OPERATIVE DIAGNOSIS:  OA RIGHT SHOULDER   POST-OPERATIVE DIAGNOSIS:  SAME WITH ROTATOR CUFF DEFICIENCY  PROCEDURE:  RIGHT SHOULDER REVERSE ARTHROPLASTY #16 stem, +9 poly, 42 eccentric glenosphere  SURGEON:  Gotham Raden, Vania ReaKevin M. M.D.  ASSISTANTS: Shuford pac   ANESTHESIA:   GET + ISB  EBL: 350   SPECIMEN:  none  Drains: none   PATIENT DISPOSITION:  PACU - hemodynamically stable.    PLAN OF CARE: Admit to inpatient   Dictation# [id number]

## 2014-04-29 NOTE — Transfer of Care (Signed)
Immediate Anesthesia Transfer of Care Note  Patient: [name]  Procedure(s) Performed: Procedure(s): REVERSE TOTAL SHOULDER ARTHROPLASTY  (Right)  Patient Location: PACU  Anesthesia Type:General  Level of Consciousness: sedated  Airway & Oxygen Therapy: Patient Spontanous Breathing and Patient connected to face mask oxygen  Post-op Assessment: Report given to RN and Post -op Vital signs reviewed and stable  Post vital signs: Reviewed and stable  Last Vitals:  Filed Vitals:   04/29/14 0658  BP: 149/88  Pulse: 86  Temp: 36.4 C  Resp: 20    Complications: No apparent anesthesia complications

## 2014-04-29 NOTE — H&P (Signed)
[name] A Apachito    Chief Complaint: OA RIGHT SHOULDER  HPI: The patient is a 81 y.o. male with end stage right shoulder OA  Past Medical History  Diagnosis Date  . Heart attack   . Depression with anxiety     takes Clonazepam nightly  . RLS (restless legs syndrome)   . Unspecified vitamin D deficiency   . Vitamin B 12 deficiency   . Allergic rhinitis   . Coronary artery disease     s/p remote IWMI 2004 with PCI of the RCA with BMS and repeat cath 2007 due to recurrent CP and abnormal nuclear stress test and cath showed widely patent RCA stent with 30-50% distal stenosis before the takeoff of a PL and PDA and normal LVF.   [name]. Hypertension     takes Diovan daily  . Constipation     takes Miralax daily as needed  . GERD (gastroesophageal reflux disease)     takes Omeprazole and Pepcid daily  . Hyperlipidemia     takes Atorvastatin daily  . History of bronchitis     pulmonologist is Dr.Wert.  . Arthritis   . Joint pain     Past Surgical History  Procedure Laterality Date  . Stents    . Nasal sinus surgery    . Hand surgery Bilateral   . Hernia repair Left     inguinal   . Cardiac catheterization  2004  . Coronary angioplasty      1 stent  . Colonoscopy    . Esophagogastroduodenoscopy    . Cataract surgery      Family History  Problem Relation Age of Onset  . Cancer - Prostate Father   . Asthma Mother     Social History:  reports that he quit smoking about 50 years ago. His smoking use included Cigarettes. He has a 45 pack-year smoking history. He does not have any smokeless tobacco history on file. He reports that he does not drink alcohol or use illicit drugs.  Allergies: No Known Allergies  Medications Prior to Admission  Medication Sig Dispense Refill  . aspirin 81 MG tablet Take 81 mg by mouth daily.    [name]. atorvastatin (LIPITOR) 20 MG tablet Take 1 tablet (20 mg total) by mouth daily. 30 tablet 6  . cetirizine-pseudoephedrine (ZYRTEC-D) 5-120 MG per tablet Take  1 tablet by mouth 2 (two) times daily.    . clonazePAM (KLONOPIN) 0.5 MG tablet Take 0.5 mg by mouth at bedtime.    . famotidine (PEPCID) 20 MG tablet Take 1 tablet (20 mg total) by mouth 2 (two) times daily. 180 tablet 0  . fluticasone (FLONASE) 50 MCG/ACT nasal spray Place 1 spray into both nostrils daily as needed for allergies or rhinitis.    . Methylfol-Methylcob-Acetylcyst (CEREFOLIN NAC) 6-2-600 MG TABS Take one tablet by mouth one time daily 90 each 0  . omeprazole (PRILOSEC) 20 MG capsule Take 1 capsule (20 mg total) by mouth daily. 90 capsule 0  . polyethylene glycol (MIRALAX / GLYCOLAX) packet Take 17 g by mouth daily as needed.    . valsartan (DIOVAN) 160 MG tablet Take 1 tablet (160 mg total) by mouth daily. 90 tablet 0     Physical Exam: right shoulder with painful and restricted motion as noted at recent office visits  Vitals  Temp:  [97.5 F (36.4 C)] 97.5 F (36.4 C) (03/24 0658) Pulse Rate:  [82-88] 87 (03/24 0725) Resp:  [20] 20 (03/24 0658) BP: (149)/(88) 149/88 mmHg (03/24  1610) SpO2:  [98 %] 98 % (03/24 0658) Weight:  [87.544 kg (193 lb)] 87.544 kg (193 lb) (03/24 0658)  Assessment/Plan  Impression: OA RIGHT SHOULDER   Plan of Action: Procedure(s): RIGHT TOTAL SHOULDER ARTHROPLASTY VERSES  REVERSE TOTAL SHOULDER ARTHROPLASTY   [name] 04/29/2014, 7:26 AM

## 2014-04-29 NOTE — Progress Notes (Signed)
Utilization review completed.  

## 2014-04-29 NOTE — Progress Notes (Signed)
Pt resting in bed semi fowlers. Pt just finished lunch and now is c/o burning in his "stomach" with some chest discomfort. Pt repts that he has experienced this feeling before and it was "heartburn". No other associated s/sx of distress verbalized and pt appears comfortable with wife at bedside. Pt given oxycodone 5 mg at 1459 for c/o R shoulder pain and also given mylanta for the "indigestion". Will reassess in 15 minutes regarding the "indigestion" discomfort and 1 hr regarding shoulder pain.

## 2014-04-29 NOTE — Anesthesia Preprocedure Evaluation (Addendum)
Anesthesia Evaluation  Patient identified by MRN, date of birth, ID band Patient awake    Reviewed: Allergy & Precautions, NPO status , Patient's Chart, lab work & pertinent test results  Airway Mallampati: II  TM Distance: >3 FB Neck ROM: Full    Dental no notable dental hx. (+) Teeth Intact   Pulmonary shortness of breath, COPDformer smoker,  breath sounds clear to auscultation  Pulmonary exam normal       Cardiovascular hypertension, Pt. on medications + CAD and + Past MI Rhythm:Regular Rate:Normal     Neuro/Psych PSYCHIATRIC DISORDERS Anxiety negative neurological ROS     GI/Hepatic Neg liver ROS, GERD-  Medicated,  Endo/Other  negative endocrine ROS  Renal/GU negative Renal ROS     Musculoskeletal negative musculoskeletal ROS (+) Arthritis -,   Abdominal   Peds  Hematology negative hematology ROS (+)   Anesthesia Other Findings   Reproductive/Obstetrics negative OB ROS                           Anesthesia Physical Anesthesia Plan  ASA: III  Anesthesia Plan: General and Regional   Post-op Pain Management:    Induction: Intravenous  Airway Management Planned: Oral ETT  Additional Equipment:   Intra-op Plan:   Post-operative Plan: Extubation in OR  Informed Consent: I have reviewed the patients History and Physical, chart, labs and discussed the procedure including the risks, benefits and alternatives for the proposed anesthesia with the patient or authorized representative who has indicated his/her understanding and acceptance.   Dental advisory given  Plan Discussed with: CRNA  Anesthesia Plan Comments:         Anesthesia Quick Evaluation

## 2014-04-29 NOTE — Progress Notes (Signed)
Pt resting comfortably and denies "indigestion" at this time. No visible s/sx of distress and no c/o such.

## 2014-04-29 NOTE — Op Note (Signed)
[name], [name]                ACCOUNT NO.:  [id number]  MEDICAL RECORD NO.:  [id number]  LOCATION:  [facility]                         FACILITY:  [facility]  PHYSICIAN:  [name]. Lincy Belles, M.D.  DATE OF BIRTH:  [birth year]  DATE OF PROCEDURE:  04/29/2014 DATE OF DISCHARGE:                              OPERATIVE REPORT   PREOPERATIVE DIAGNOSIS:  End-stage right shoulder osteoarthritis with possible rotator cuff deficiency.  POSTOPERATIVE DIAGNOSIS:  End-stage right shoulder osteoarthritis with chronic rotator cuff deficiency.  PROCEDURE:  Right shoulder reverse arthroplasty utilizing a press-fit size 16 DePuy stem +1 epi, +9 polyethylene insert, and a 42 eccentric glenosphere.  SURGEON:  [name]. [name], M.D.  [name] Douglas A. Shuford, PA-C.  ANESTHESIA:  General endotracheal as well as an interscalene block.  ESTIMATED BLOOD LOSS:  350 mL.  DRAINS:  None.  HISTORY:  [name] is an 81 year old male who has had chronic right shoulder pain with profound restrictions in mobility with significantly increasing functional limitations related to end-stage glenohumeral arthritis with severe bony deformity and severe restrictions in mobility.  Due to increasing pain and functional limitations, he is brought to the operating room at this time for planned right shoulder arthroplasty as described below.  Preoperatively, I counseled [name] regarding treatment options and risks versus benefits thereof.  Possible surgical complications were all reviewed with potential for bleeding, infection, neurovascular injury, persistent pain, loss of motion, anesthetic complication, failure of the implant, and possible need for additional surgery.  He understands and accepts and agrees with our planned procedure.  PROCEDURE IN DETAIL:  After undergoing routine preop evaluation, the patient received prophylactic antibiotics and an interscalene block was established in the holding area by the  Anesthesia Department.  Placed supine on the operating table.  He underwent smooth induction of an element anesthesia.  Placed into beach-chair position and appropriately padded and protected.  The right shoulder girdle region was sterilely prepped and draped in standard fashion.  Time-out was called.  An anterior deltopectoral approach was made in the right shoulder through a 10 cm incision.  Skin flaps were elevated.  Electrocautery was used for hemostasis.  The deltopectoral interval was then developed from proximal to distal.  The cephalic vein taken laterally.  The upper centimeter of the pectoralis major was tenotomized to enhance exposure.  The conjoined tendon was then carefully mobilized, retracted medially and adhesions beneath the deltoid were also divided.  There was noted to be severe bony deformity with very prominent osteophyte of the lesser tuberosity and a very large inferior humeral head osteophyte with marked deformity of the humeral head and severe erosion of the bony contours with some medialization as well of the glenoid.  Given the degree of deformity and complete deficiency of the rotator cuff anteriorly and superiorly, we elected to proceed with a reverse shoulder arthroplasty.  Given these findings, we went ahead and did a soft tissue dissection of the remnants of the subscap off the lesser tuberosity and split the rotator interval base of the coracoid and then the remnant of the rotator cuff superiorly was dissected away from the tuberosity and the bicipital groove had completely overgrown with bone.  The biceps tendon remnant was identified and tenotomized.  A rongeur was then used to remove the large osteophyte anteriorly and then releasing soft tissues anteriorly, inferiorly, and capsular releases inferiorly, we were able to deliver the humeral head to the wound.  It was markedly deformed.  Once we had complete delivery of the humeral head, I then used a  rongeur to gain access to the apex of the humeral head and then performed hand reaming of the humeral canal up to size 16.  The intramedullary guide was placed and then we made a humeral head resection with an oscillating saw using the appropriate guide at the appropriately determined level, care was taken to protect the surrounding soft tissues.  At this point, we completed resection of peripheral osteophytes from the proximal humerus. Again, there was significant subacromial bony cystic change.  Once we completed the resection of the humeral head, we then placed a metal cap over the cut surface of the proximal humerus and then exposed the glenoid.  Again, the glenoid was markedly deformed with significant medialization and some mild retroversion.  I gained circumferential exposure of the glenoid using electrocautery and then placed a guide pin into the center of the glenoid favoring inferiorly with slight inferior guide pin was then placed.  We had reamed the glenoid to a stable subchondral bony bed.  I used the peripheral reamer to remove extra bone and there were some prominent osteophytes as well.  They were removed with a rongeur and once we gained proper exposure and planing of the glenoid face, we then drilled our central drill hole.  The glenoid was irrigated.  Our base plate was impacted.  It was then transfixed with an inferior locking screw, superior, anterior, and posterior nonlocking screws due to the thinness of the glenoid, but all screws obtained excellent bony purchase and inferior loose screw was then locked.  The 42 eccentric glenosphere was then placed onto the base plate, appropriately tensioned, impacted and retention multiple times obtaining excellent fixation.  At this point, we then returned our attention to the proximal humerus where the canal was irrigated.  We placed our reaming stem in the approximately 0 degrees retroversion.  We sized the proximal humerus  and the +1 epi was the best fit.  We reamed with +1 epi.  The trial reduction was performed showing good position and good soft tissue balance.  Trials were removed.  The canal was irrigated.  We placed 2 FiberWires through the metaphyseal bone of the proximal humerus and then we impacted our size 16 +1 epi stem at approximately 0 degrees retroversion and excellent bony fixation.  We then performed a series of reductions and ultimately the +9 poly showed the best soft tissue balance.  The final +9 poly was then placed into the stem and final reduction was performed.  The joint was irrigated.  Hemostasis was obtained.  At this point, I was able to place the tag sutures through the free margin of the subscapularis and this was then circumferentially mobilized and we were able to bring the subscap back to the metaphysis of the humerus through our sutures previously placed and this reapproximation was much to our satisfaction.  At this point, again irrigation was completed.  Hemostasis was obtained.  The arm was taken through range of motion showing good stability and good motion.  The deltopectoral interval was then reapproximated with a series of #1 figure-of-eight Vicryl sutures.  A 2-0 Vicryl used for the subcu layer and  intracuticular 3-0 Monocryl for the skin followed by Dermabond, Aquacel dressing.  Right arm was then placed into a sling and the patient was then awakened, extubated, and taken to recovery room in stable condition.  Tracy A. Shuford, PA-C was used an Geophysicist/field seismologist throughout this case essential for help with positioning the patient, positioning the extremity, management of the tissue retractors, tissue manipulation, implantation of prosthesis, wound closure, and intraoperative decision making.     [name]. Adel Neyer, M.D.     KMS/MEDQ  D:  04/29/2014  T:  04/29/2014  Job:  [id number]

## 2014-04-29 NOTE — Anesthesia Procedure Notes (Addendum)
Procedure Name: Intubation Date/Time: 04/29/2014 7:40 AM Performed by: Quentin OreWALKER, LUZ E Pre-anesthesia Checklist: Patient identified, Emergency Drugs available, Suction available, Patient being monitored and Timeout performed Patient Re-evaluated:Patient Re-evaluated prior to inductionOxygen Delivery Method: Circle system utilized Preoxygenation: Pre-oxygenation with 100% oxygen Intubation Type: IV induction Ventilation: Mask ventilation without difficulty Laryngoscope Size: Mac and 4 Grade View: Grade I Tube type: Oral Tube size: 7.5 mm Number of attempts: 1 Airway Equipment and Method: Stylet Placement Confirmation: ETT inserted through vocal cords under direct vision,  positive ETCO2 and breath sounds checked- equal and bilateral Secured at: 22 cm Tube secured with: Tape Dental Injury: Teeth and Oropharynx as per pre-operative assessment    Anesthesia Regional Block:  Interscalene brachial plexus block  Pre-Anesthetic Checklist: ,, timeout performed, Correct Patient, Correct Site, Correct Laterality, Correct Procedure, Correct Position, site marked, Risks and benefits discussed,  Surgical consent,  Pre-op evaluation,  At surgeon's request and post-op pain management  Laterality: Right  Prep: chloraprep       Needles:  Injection technique: Single-shot  Needle Type: Stimulator Needle - 40     Needle Length: 4cm 4 cm Needle Gauge: 22 and 22 G    Additional Needles:  Procedures: ultrasound guided (picture in chart) Interscalene brachial plexus block Narrative:  Injection made incrementally with aspirations every 5 mL.  Performed by: Personally  Anesthesiologist: Lewie LoronGERMEROTH, Jahniyah Revere

## 2014-04-29 NOTE — Anesthesia Postprocedure Evaluation (Signed)
Anesthesia Post Note  Patient: [name]  Procedure(s) Performed: Procedure(s) (LRB): REVERSE TOTAL SHOULDER ARTHROPLASTY  (Right)  Anesthesia type: General + Interscalene block  Patient location: PACU  Post pain: Pain level controlled  Post assessment: Post-op Vital signs reviewed  Last Vitals: BP 130/62 mmHg  Pulse 75  Temp(Src) 36.3 C (Oral)  Resp 16  Ht 5\' 8"  (1.727 m)  Wt 193 lb (87.544 kg)  BMI 29.35 kg/m2  SpO2 99%  Post vital signs: Reviewed  Level of consciousness: sedated  Complications: No apparent anesthesia complications

## 2014-04-29 NOTE — Progress Notes (Signed)
Report given to maria rn as caregiver 

## 2014-04-30 MED ORDER — IPRATROPIUM-ALBUTEROL 0.5-2.5 (3) MG/3ML IN SOLN
3.0000 mL | Freq: Four times a day (QID) | RESPIRATORY_TRACT | Status: DC | PRN
  Administered 2014-04-30 – 2014-05-01 (×3): 3 mL via RESPIRATORY_TRACT
  Filled 2014-04-30 (×3): qty 3

## 2014-04-30 MED ORDER — ZOLPIDEM TARTRATE 5 MG PO TABS
5.0000 mg | ORAL_TABLET | Freq: Every evening | ORAL | Status: DC | PRN
  Administered 2014-04-30: 5 mg via ORAL
  Filled 2014-04-30: qty 1

## 2014-04-30 MED ORDER — TAMSULOSIN HCL 0.4 MG PO CAPS
0.4000 mg | ORAL_CAPSULE | Freq: Every day | ORAL | Status: DC
  Administered 2014-04-30 – 2014-05-03 (×4): 0.4 mg via ORAL
  Filled 2014-04-30 (×5): qty 1

## 2014-04-30 MED ORDER — TAMSULOSIN HCL 0.4 MG PO CAPS: 0.4000 mg | ORAL_CAPSULE | Freq: Every day | ORAL | Status: DC

## 2014-04-30 MED ORDER — BISACODYL 5 MG PO TBEC: 5.0000 mg | DELAYED_RELEASE_TABLET | Freq: Every day | ORAL | Status: DC | PRN

## 2014-04-30 MED ORDER — DOCUSATE SODIUM 100 MG PO CAPS: 100.0000 mg | ORAL_CAPSULE | Freq: Two times a day (BID) | ORAL | Status: DC

## 2014-04-30 MED ORDER — POLYETHYLENE GLYCOL 3350 17 G PO PACK: 17.0000 g | PACK | Freq: Every day | ORAL | Status: DC | PRN

## 2014-04-30 NOTE — Progress Notes (Signed)
Occupational Therapy Evaluation Patient Details Name: Douglas PentonRalph A Mcduffee MRN: [id number] DOB: 01/24/1932 Today's Date: 04/30/2014    History of Present Illness R reverse TSA   Clinical Impression   On entry to room, pt standing naked at his door with SCDs attached, reaching for door with his RUE. Pt safely moved to bed. Pt required Min A for mobility due to unsteadiness. Pt oriented to self only. Unable to complete education at this time due to pt's cognitive status. No family in room. Pt not appropriate for D/C home today with level of confusion (?related to meds/lack of sleep?baseline?). Will need to make sure caregiver can provide level of assistance needed in order for pt to D/C home.Will follow to continue with D/C planning once family is present. Notified nsg of situation.     Follow Up Recommendations  Home health OT;Supervision/Assistance - 24 hour    Equipment Recommendations  Tub/shower seat    Recommendations for Other Services PT consult     Precautions / Restrictions Precautions Precautions: Shoulder Type of Shoulder Precautions: passive protocol. FF 90; Abd 60; ER 30; AROM elbow/wrist/hand; no AROM shoulder; pendulums OK Restrictions Weight Bearing Restrictions: Yes RUE Weight Bearing: Non weight bearing      Mobility Bed Mobility Overal bed mobility: Modified Independent    Pt was at the door without sling on. Unsure if pt used RUE to push up from bed            Transfers Overall transfer level: Needs assistance Equipment used: 1 person hand held assist Transfers: Sit to/from UGI CorporationStand;Stand Pivot Transfers Sit to Stand: Min assist Stand pivot transfers: Min assist       General transfer comment: Pt unsteady    Balance Overall balance assessment: Needs assistance           Standing balance-Leahy Scale: Poor Standing balance comment: several balance checks during mobility                            ADL Overall ADL's : Needs  assistance/impaired     Grooming: Moderate assistance   Upper Body Bathing: Moderate assistance   Lower Body Bathing: Minimal assistance   Upper Body Dressing : Moderate assistance   Lower Body Dressing: Minimal assistance   Toilet Transfer: Minimal assistance   Toileting- Clothing Manipulation and Hygiene: Moderate assistance       Functional mobility during ADLs: Minimal assistance General ADL Comments: On entry to room, pt standing naked on door way trying to open door withoperated RUE assisting. SCDs attached. sling on the floor. Pt not oriented to place or situation. Pt safely positioned on bed. Unable to complete any ADL education due to pt's cognitive status and no family avilable.      Vision     Perception     Praxis      Pertinent Vitals/Pain Pain Assessment: Faces Faces Pain Scale: Hurts little more Pain Location: R shoulder Pain Descriptors / Indicators: Grimacing Pain Intervention(s): Limited activity within patient's tolerance;Monitored during session;Repositioned     Hand Dominance     Extremity/Trunk Assessment Upper Extremity Assessment Upper Extremity Assessment: RUE deficits/detail RUE Deficits / Details: passive protocol. see precautions. Pt actively using his RUE, regardiless of max vc to use use shoulder.  RUE Coordination: decreased gross motor   Lower Extremity Assessment Lower Extremity Assessment: Defer to PT evaluation   Cervical / Trunk Assessment Cervical / Trunk Assessment: Kyphotic   Communication Communication Communication: No difficulties  Cognition Arousal/Alertness: Awake/alert Behavior During Therapy: Impulsive;Flat affect Overall Cognitive Status: No family/caregiver present to determine baseline cognitive functioning       Memory: Decreased short-term memory;Decreased recall of precautions             General Comments       Exercises Exercises: Shoulder     Shoulder Instructions Shoulder  Instructions ROM for elbow, wrist and digits of operated UE: Minimal assistance (to adhere to protocol)    Home Living Family/patient expects to be discharged to:: Private residence                                 Additional Comments: Pt unable to give any information about his living situation. States he lives at [employer]" with his ife. Per pt, he is still at [facility] at this moment.      Prior Functioning/Environment          Comments: pt unable to give information. States he "can do for myself"   Luisa Dago, OTR/L  [phone] 05-08-14 OT Diagnosis: Generalized weakness;Acute pain;Altered mental status   OT Problem List: Decreased strength;Decreased range of motion;Impaired balance (sitting and/or standing);Decreased coordination;Decreased cognition;Decreased safety awareness;Decreased knowledge of use of DME or AE;Decreased knowledge of precautions;Impaired UE functional use;Pain   OT Treatment/Interventions: Self-care/ADL training;Therapeutic exercise;Therapeutic activities;Cognitive remediation/compensation;Patient/family education    OT Goals(Current goals can be found in the care plan section) Acute Rehab OT Goals Patient Stated Goal: pt did not state OT Goal Formulation: Patient unable to participate in goal setting Time For Goal Achievement: 05/07/14 Potential to Achieve Goals: Good  OT Frequency: Min 3X/week   Barriers to D/C: Other (comment) (unsure)          Co-evaluation              End of Session Equipment Utilized During Treatment: Gait belt Nurse Communication: Mobility status;Other (comment);Weight bearing status;Precautions (pt's situation on entry to room)  Activity Tolerance: Other (comment) (limited by confusion) Patient left: in chair;with call bell/phone within reach;with chair alarm set   Time: [phone] OT Time Calculation (min): 29 min Charges:  OT General Charges $OT Visit: 1 Procedure OT Evaluation $Initial OT  Evaluation Tier I: 1 Procedure OT Treatments $Self Care/Home Management : 8-22 mins G-Codes:    Natalie Mceuen,HILLARY 2014/05/08, 10:36 AM

## 2014-04-30 NOTE — Progress Notes (Signed)
Patient assisted to the bathroom multiple times throughout the evening and was still unable to spontaneously void. Per patient, he doesn't have an urge to void nor does he feel full in the abdomen area. Patient last in and out catheterized at 1900 on 04/29/14. Patient bladder scanned and was found to have 337cc of urine in bladder. Spoke with Alphonsa OverallBrad Dixon, PA regarding patient's inability to spontaneously void on his own. [facility] [place] to start patient on Flomax 0.4 mg daily and to in and out catheterize patient if he begins to fill very full in the abdomen area. First dose of Flomax given at 0354. Patient resting comfortably on the bed, Nursing will continue to monitor.

## 2014-04-30 NOTE — Progress Notes (Signed)
Patient unable to void spontaneously. Bladder scan revealed or urine. Foley catheter place at 1805 for urinary retention.

## 2014-04-30 NOTE — Progress Notes (Signed)
OT reports walking into room to find patient out of bed, taking gown off and attempting to open the door. Patient confused at the time of therapy. RN assessed patient and he is currently alert to time [facility] Chair alarm on and re-educated on importance of sling. Will continue to monitor for confusion.

## 2014-04-30 NOTE — Evaluation (Signed)
Physical Therapy Evaluation Patient Details Name: [name] MRN: [id number] DOB: 1931/03/01 Today's Date: 04/30/2014   History of Present Illness  Pt is a 81 y/o M s/p R reverse total shoulder arthroplasty.  Pt's PMH includes heart attack, restless legg syndrome, HTN, and CAD.  Clinical Impression  Patient is s/p above surgery resulting in functional limitations due to the deficits listed below (see PT Problem List). Pt's AMS is likely 2/2 medication as wife said onset was after surgery.  All of pt's history was taken from wife for this reason.  Pt unsteady and unable to avoid obstacles in hallway, which improved w/ use of single point cane.  Patient will benefit from skilled PT to increase their independence and safety with mobility to allow discharge to the venue listed below.       Follow Up Recommendations No PT follow up;Supervision/Assistance - 24 hour;Supervision for mobility/OOB    Equipment Recommendations  Cane    Recommendations for Other Services       Precautions / Restrictions Precautions Precautions: Shoulder Type of Shoulder Precautions: passive protocol. FF 90; Abd 60; ER 30; AROM elbow/wrist/hand; no AROM shoulder; pendulums OK Restrictions Weight Bearing Restrictions: Yes RUE Weight Bearing: Non weight bearing      Mobility  Bed Mobility Overal bed mobility: Modified Independent             General bed mobility comments: use of rails and v/c's to scoot toward EOB to maintain balance  Transfers Overall transfer level: Needs assistance Equipment used: None Transfers: Sit to/from Stand Sit to Stand: Min guard         General transfer comment: Pt unsteady  Ambulation/Gait Ambulation/Gait assistance: Min guard Ambulation Distance (Feet): 300 Feet Assistive device: None;Straight cane Gait Pattern/deviations: Step-through pattern;Staggering right;Staggering left;Narrow base of support     General Gait Details: Pt staggering R and L but able  to maintain balance.  Unable to navigate around obstacles in hallways w/o verbal and tactile cues.  Pt's balance ambulating improved w/ use of straight cane.  Stairs Stairs: Yes Stairs assistance: Min guard Stair Management: One rail Right;One rail Left;Forwards (L rail going up, R rail going down) Number of Stairs: 3 General stair comments: Pt steady w/ support of railing  Wheelchair Mobility    Modified Rankin (Stroke Patients Only)       Balance Overall balance assessment: Needs assistance Sitting-balance support: Feet supported;Single extremity supported Sitting balance-Leahy Scale: Fair     Standing balance support: Single extremity supported;No upper extremity supported Standing balance-Leahy Scale: Poor Standing balance comment: Pt's balance improved w/ use of single point cane                             Pertinent Vitals/Pain Pain Assessment: Faces Pain Score: 3  Faces Pain Scale: Hurts a little bit Pain Location: R shoulder Pain Descriptors / Indicators: Grimacing Pain Intervention(s): Limited activity within patient's tolerance;Monitored during session;Repositioned    Home Living Family/patient expects to be discharged to:: Private residence Living Arrangements: Spouse/significant other (wife) Available Help at Discharge: Family;Available 24 hours/day Type of Home: Apartment Home Access: Stairs to enter Entrance Stairs-Rails: Right;Left (railings on both sides, can't reach both at same time) Entrance Stairs-Number of Steps: 3 Home Layout: One level Home Equipment: None Additional Comments: Pt unable to give any information about his living situation; all information was obtained from pt's wife    Prior Function Level of Independence: Independent  Hand Dominance        Extremity/Trunk Assessment   Upper Extremity Assessment: Defer to OT evaluation           Lower Extremity Assessment: Overall WFL for tasks assessed  (5/5 B knee extension, flexion, DF, PF)      Cervical / Trunk Assessment: Kyphotic  Communication   Communication: No difficulties  Cognition Arousal/Alertness: Awake/alert Behavior During Therapy: Flat affect;Impulsive Overall Cognitive Status: Impaired/Different from baseline Area of Impairment: Orientation;Memory;Safety/judgement;Awareness Orientation Level: Disoriented to;Time;Place   Memory: Decreased short-term memory;Decreased recall of precautions   Safety/Judgement: Decreased awareness of safety;Decreased awareness of deficits Awareness: Anticipatory   General Comments: Pt has hard time navigating around objects in hallway w/o verbal and tactile cues.  Pt's wife reports that this is not pt's baseline and she believes it is likely the medication as this AMS began after surgery.    General Comments      Exercises        Assessment/Plan    PT Assessment Patient needs continued PT services  PT Diagnosis Difficulty walking;Abnormality of gait;Acute pain   PT Problem List Decreased strength;Decreased range of motion;Decreased activity tolerance;Decreased balance;Decreased mobility;Decreased coordination;Decreased cognition;Decreased knowledge of use of DME;Decreased safety awareness;Decreased knowledge of precautions;Pain  PT Treatment Interventions DME instruction;Gait training;Functional mobility training;Stair training;Therapeutic activities;Therapeutic exercise;Balance training;Neuromuscular re-education;Cognitive remediation;Patient/family education;Modalities   PT Goals (Current goals can be found in the Care Plan section) Acute Rehab PT Goals Patient Stated Goal: not stated PT Goal Formulation: With patient/family Time For Goal Achievement: 05/07/14 Potential to Achieve Goals: Good    Frequency Min 2X/week   Barriers to discharge        Co-evaluation               End of Session Equipment Utilized During Treatment: Gait belt Activity Tolerance:  Patient tolerated treatment well Patient left: in chair;with call bell/phone within reach;with chair alarm set;with family/visitor present Nurse Communication: Mobility status         Time: 1555-1655 (25 min spent finding cane and obtaining pt history from wife) PT Time Calculation (min) (ACUTE ONLY): 60 min   Charges:   PT Evaluation $Initial PT Evaluation Tier I: 1 Procedure PT Treatments $Gait Training: 23-37 mins   PT G CodesMichail Jewels:       Ashley Parr PT, [place]PT [phone] 04/30/2014, 5:18 PM

## 2014-04-30 NOTE — Care Management Note (Signed)
CARE MANAGEMENT NOTE 04/30/2014  Patient:  [name],[name]   Account Number:  [id number]  Date Initiated:  04/30/2014  Documentation initiated by:  [name],[name]  Subjective/Objective Assessment:   81 yr old male admitted with osteoarthritis of right shoulder. Patient underwent [name] right total reverse shoulder arthroplasty.     Action/Plan:   Anticipated DC Date:     Anticipated DC Plan:  HOME W HOME HEALTH SERVICES      DC Planning Services  CM consult      [facility]   Choice offered to / List presented to:          [facility]  [facility]  [facility]      [facility]  [facility]   Status of service:  In process, will continue to follow Medicare Important Message given?   (If response is "NO", the following Medicare IM given date fields will be blank) Date Medicare IM given:   Medicare IM given by:   Date Additional Medicare IM given:   Additional Medicare IM given by:    Discharge Disposition:    Per UR Regulation:    If discussed at Long Length of Stay Meetings, dates discussed:    Comments:  04/30/14 4:00pm Douglas PeperSusan Leonilda Cozby, RN BSN Case Manager Patient is confused, unable to discuss payment for tub bench. Will ask weekend CM to followup.

## 2014-04-30 NOTE — Progress Notes (Signed)
Family called RN to room at 1830. Pt c/o abdominal cramping and feeling "full". Pt abdomen noted to be tight and somewhat distended. Pt denies chest pain or any change in his baseline SOB. Pt assisted to edge of bed. Pt began belching immediately. Pt repts that he feels "stomach is full of gas". BS +x4. Pt hasn't been able to spontaneously pass gas yet. Pt did have some nausea that had subsided after ordered Zofran.  Pt hasn't been able to void since surgery and has been drinking a substantial amount of water. Pt states, "I don't feel like I need to urinate". Pt assisted to BR and wasn't able to void. Pt taken for a walk in the hall. Pt ambulated with steady gait and one person minimal assist. Sling in place. Pt passed large amount of gas with ambulation. With ambulation, pt noted to have an audible  wheeze and some SOB. Pt repts "that's the way my breathing is at home". Pt repts he sees Dr. Sherene SiresWert for his lung "problems". Pt assisted back to room and placed in recliner. Pt repts his stomach feels "much better" after ambulation. Pt BP 103/76 pulse 76 oxygen sat 93% on RA. Upon resting for a bit, pt's SOB subsided. Lungs diminished in the bases with few scattered fine wheezes. Lung assessment virtually unchanged from admission.  Pt bladder scanned for 600 cc and in and out cath performed. Pt tolerated cath well. 700cc of clear yellow urine obtained. Will continue to monitor.

## 2014-04-30 NOTE — Progress Notes (Signed)
Patient ID: [name], male   DOB: 06/06/1931, 81 y.o.   MRN: [id number] Subjective: 1 Day Post-Op Procedure(s) (LRB): REVERSE TOTAL SHOULDER ARTHROPLASTY  (Right)    Patient reports pain as moderate. Feels lousy this am. No sleep.  Difficulty with urination.  Had to have I&O cath.  Started on flomax   Objective:   VITALS:   Filed Vitals:   04/30/14 0500  BP: 108/64  Pulse: 95  Temp: 99.5 F (37.5 C)  Resp: 16    Neurovascular intact Incision: dressing C/D/I - aquacell dressing  LABS No results for input(s): HGB, HCT, WBC, PLT in the last 72 hours.  No results for input(s): NA, K, BUN, CREATININE, GLUCOSE in the last 72 hours.  No results for input(s): LABPT, INR in the last 72 hours.   Assessment/Plan: 1 Day Post-Op Procedure(s) (LRB): REVERSE TOTAL SHOULDER ARTHROPLASTY  (Right)   Up with therapy   Plan today is to observe for ability to urinate.  If able to void then may be able to go home - meds will need to be reconciled to include flomax If requires to I&O caths today then on second plan to leave catheter in place and follow up with urology in 5 days

## 2014-04-30 NOTE — Progress Notes (Signed)
Patient unable to void post in and out cath at 0100. Bladder scan performed and revealed of urine. In and out cath #2 performed and output was . Will continue to monitor for spontaneous void, if patient unable to void per MD order will place foley catheter.

## 2014-05-01 MED ORDER — TRAMADOL HCL 50 MG PO TABS: 50.0000 mg | ORAL_TABLET | Freq: Four times a day (QID) | ORAL | Status: DC | PRN

## 2014-05-01 MED ORDER — DIAZEPAM 5 MG PO TABS: 2.5000 mg | ORAL_TABLET | Freq: Four times a day (QID) | ORAL | Status: DC | PRN

## 2014-05-01 MED ORDER — OXYCODONE-ACETAMINOPHEN 5-325 MG PO TABS: 1.0000 | ORAL_TABLET | ORAL | Status: DC | PRN

## 2014-05-01 MED ORDER — IPRATROPIUM-ALBUTEROL 0.5-2.5 (3) MG/3ML IN SOLN
3.0000 mL | Freq: Two times a day (BID) | RESPIRATORY_TRACT | Status: DC
  Administered 2014-05-01 – 2014-05-03 (×5): 3 mL via RESPIRATORY_TRACT
  Filled 2014-05-01 (×5): qty 3

## 2014-05-01 NOTE — Progress Notes (Signed)
After another discussion with wife and after multiple incidences of [name] trying to get up and demonstrating inability to follow instruction and safety concerns we have decided a short tern inpatient rehab stay might be safest option. Will cancel discharge and ask social work to begin bed search

## 2014-05-01 NOTE — Progress Notes (Addendum)
Occupational Therapy Treatment Patient Details Name: [name] MRN: [id number] DOB: [birth year] Today's Date: 05/01/2014    History of present illness Pt is a 81 y/o M s/p R reverse total shoulder arthroplasty.  Pt's PMH includes heart attack, restless legg syndrome, HTN, and CAD.   OT comments  Wife present for session for education. OT assisted in better positioning pillows and positioning pt in bed. Performed RUE exercises.  Follow Up Recommendations  SNF;Supervision/Assistance - 24 hour    Equipment Recommendations  Tub/shower seat    Recommendations for Other Services      Precautions / Restrictions Precautions Precautions: Shoulder Type of Shoulder Precautions: passive protocol. FF 90; Abd 60; ER 30; AROM elbow/wrist/hand; no AROM shoulder; pendulums OK; OT spoke with [name] PA and she states it is okay to use RUE for ADLs  (need to clarify and update order) Shoulder Interventions: Shoulder sling/immobilizer;Off for dressing/bathing/exercises Precaution Booklet Issued: Yes (comment) Precaution Comments: educated on NWB of RUE Required Braces or Orthoses: Sling Restrictions Weight Bearing Restrictions: Yes RUE Weight Bearing: Non weight bearing       Mobility Bed Mobility  [place] position used to scoot HOB and cues given for technique.                Transfers                 General transfer comment: not assessed                        Cognition  Awake/Alert  Behavior During Therapy: Flat affect Overall Cognitive Status: History of cognitive impairments - at baseline (think cognition may be off compared to baseline due to meds)                       Extremity/Trunk Assessment               Exercises Other Exercises Other Exercises: Performed (A/AAROM) right elbow flexion/extension, (AROM/PROM as pt was not understanding) wrist flexion/extension, and moved digits (AROM).   Other Exercises: Performed right shoulder  flexion, abduction, and external rotation (PROM with cues for pt to relax) Donning/doffing shirt without moving shoulder:  (educated on technique) Method for sponge bathing under operated UE:  (educated) Donning/doffing sling/immobilizer:  (OT helped adjust sling-wife present) Correct positioning of sling/immobilizer:  (educated and OT positioned) ROM for elbow, wrist and digits of operated UE: Moderate assistance Sling wearing schedule (on at all times/off for ADL's):  (educated) Positioning of UE while sleeping:  (educated )   Shoulder Instructions Shoulder Instructions Donning/doffing shirt without moving shoulder:  (educated on technique) Method for sponge bathing under operated UE:  (educated) Donning/doffing sling/immobilizer:  (OT helped adjust sling-wife present) Correct positioning of sling/immobilizer:  (educated and OT positioned) ROM for elbow, wrist and digits of operated UE: Moderate assistance Sling wearing schedule (on at all times/off for ADL's):  (educated) Positioning of UE while sleeping:  (educated )     General Comments      Pertinent Vitals/ Pain       Pain Assessment: 0-10 Pain Score:  (5-6) Pain Location: Rt shoulder Pain Descriptors / Indicators: Grimacing Pain Intervention(s): Limited activity within patient's tolerance;Monitored during session;Repositioned  Home Living                                          Prior  Functioning/Environment              Frequency Min 3X/week     Progress Toward Goals  OT Goals(current goals can now be found in the care plan section)  Progress towards OT goals: Progressing toward goals  Acute Rehab OT Goals Patient Stated Goal: wanted to get up OT Goal Formulation: Patient unable to participate in goal setting Time For Goal Achievement: 05/07/14 Potential to Achieve Goals: Good ADL Goals Pt Will Perform Upper Body Bathing: with caregiver independent in assisting;with  supervision;sitting Pt Will Perform Lower Body Bathing: with supervision;sit to/from stand;with caregiver independent in assisting Pt Will Perform Upper Body Dressing: with supervision;with caregiver independent in assisting;sitting Pt Will Perform Lower Body Dressing: with supervision;sit to/from stand;with caregiver independent in assisting Pt Will Transfer to Toilet: with supervision;ambulating;regular height toilet (with caregiver assisting) Pt Will Perform Toileting - Clothing Manipulation and hygiene: with supervision;with caregiver independent in assisting;sit to/from stand Pt/caregiver will Perform Home Exercise Program: Right Upper extremity;With written HEP provided;With Supervision (with caregiver assisting)  Plan Discharge plan needs to be updated    Co-evaluation                 End of Session Equipment Utilized During Treatment: Other (comment) (sling)   Activity Tolerance Patient tolerated treatment well   Patient Left in bed;with call bell/phone within reach;with bed alarm set;with family/visitor present   Nurse Communication          Time: [id number] OT Time Calculation (min): 17 min  Charges: OT General Charges $OT Visit: 1 Procedure OT Treatments $Therapeutic Exercise: 8-22 mins  Earlie RavelingStraub, Collie Kittel L OTR/L [phone] 05/01/2014, 1:48 PM

## 2014-05-01 NOTE — Discharge Summary (Signed)
PATIENT ID:      Douglas PentonRalph [name]  MRN:     [id number] DOB/AGE:    09/28/1931 / 81 y.o.     DISCHARGE SUMMARY  ADMISSION DATE:    04/29/2014 DISCHARGE DATE:    ADMISSION DIAGNOSIS: OA RIGHT SHOULDER  Past Medical History  Diagnosis Date  . Heart attack   . Depression with anxiety     takes Clonazepam nightly  . RLS (restless legs syndrome)   . Unspecified vitamin D deficiency   . Vitamin B 12 deficiency   . Allergic rhinitis   . Coronary artery disease     s/p remote IWMI 2004 with PCI of the RCA with BMS and repeat cath 2007 due to recurrent CP and abnormal nuclear stress test and cath showed widely patent RCA stent with 30-50% distal stenosis before the takeoff of a PL and PDA and normal LVF.   [name]. Hypertension     takes Diovan daily  . Constipation     takes Miralax daily as needed  . GERD (gastroesophageal reflux disease)     takes Omeprazole and Pepcid daily  . Hyperlipidemia     takes Atorvastatin daily  . History of bronchitis     pulmonologist is Dr.Wert.  . Arthritis   . Joint pain     DISCHARGE DIAGNOSIS:   Active Problems:   S/P shoulder replacement   PROCEDURE: Procedure(s): REVERSE TOTAL SHOULDER ARTHROPLASTY  on 04/29/2014  CONSULTS:     HISTORY:  See H&P in chart.  HOSPITAL COURSE:  Douglas PentonRalph [name] is a 81 y.o. admitted on 04/29/2014 with a chief complaint of severe right shoulder pain, and found to have a diagnosis of OA RIGHT SHOULDER .  They were brought to the operating room on 04/29/2014 and underwent Procedure(s): REVERSE TOTAL SHOULDER ARTHROPLASTY .    They were given perioperative antibiotics: Anti-infectives    Start     Dose/Rate Route Frequency Ordered Stop   04/29/14 1400  ceFAZolin (ANCEF) IVPB 2 g/50 mL premix     2 g 100 mL/hr over 30 Minutes Intravenous Every 6 hours 04/29/14 1207 04/30/14 0424   04/29/14 0600  ceFAZolin (ANCEF) IVPB 2 g/50 mL premix     2 g 100 mL/hr over 30 Minutes Intravenous On call to O.R. 04/28/14 1440 04/29/14 0755     .  Patient underwent the above named procedure and tolerated it well. The following day they were hemodynamically stable and pain was controlled on oral analgesics. He did experience post operative urinary retention requiring I&O cath and ultimate foley placement. He was started on flomax and I spoke with urology who recommended DC home with leg bag and follow up Monday AM for voiding trial. He also had some exacerbation of his underlying dementia and was very restless. I spoke with his wife about options as she is his primary caregiver. The decision was he would still likely be best served and allow his mental stasus to clear better in his home environment. I also discussed the importance of minimizing his pain meds to help with both issues. They were neurovascularly intact to the operative extremity. OT/PT was ordered and worked with patient per protocol. They were medically and orthopaedically stable for discharge on 2. Home health was arranged to help assist in home environment.    DIAGNOSTIC STUDIES:  RECENT RADIOGRAPHIC STUDIES :  No results found.  RECENT VITAL SIGNS:  Patient Vitals for the past 24 hrs:  BP Temp Temp src Pulse Resp SpO2  05/01/14 0816 - - - - - 92 %  05/01/14 0525 109/66 mmHg 98.1 F (36.7 C) Oral 99 18 93 %  05/01/14 0343 - - - - - 91 %  04/30/14 1927 (!) 89/48 mmHg 98.6 F (37 C) Oral 96 16 95 %  04/30/14 1607 - - - - - 92 %  04/30/14 1400 106/69 mmHg 98.8 F (37.1 C) - 97 18 100 %  .  RECENT EKG RESULTS:    Orders placed or performed in visit on 04/13/14  . Cardiac event monitor    DISCHARGE INSTRUCTIONS:    DISCHARGE MEDICATIONS:     Medication List    TAKE these medications        aspirin 81 MG tablet  Take 81 mg by mouth daily.     atorvastatin 20 MG tablet  Commonly known as:  LIPITOR  Take 1 tablet (20 mg total) by mouth daily.     bisacodyl 5 MG EC tablet  Commonly known as:  DULCOLAX  Take 1 tablet (5 mg total) by mouth daily as  needed for moderate constipation.     CEREFOLIN NAC 6-2-600 MG Tabs  Take one tablet by mouth one time daily     cetirizine-pseudoephedrine 5-120 MG per tablet  Commonly known as:  ZYRTEC-D  Take 1 tablet by mouth 2 (two) times daily.     clonazePAM 0.5 MG tablet  Commonly known as:  KLONOPIN  Take 0.5 mg by mouth at bedtime.     diazepam 5 MG tablet  Commonly known as:  VALIUM  Take 0.5-1 tablets (2.5-5 mg total) by mouth every 6 (six) hours as needed for muscle spasms or sedation.     docusate sodium 100 MG capsule  Commonly known as:  COLACE  Take 1 capsule (100 mg total) by mouth 2 (two) times daily.     famotidine 20 MG tablet  Commonly known as:  PEPCID  Take 1 tablet (20 mg total) by mouth 2 (two) times daily.     fluticasone 50 MCG/ACT nasal spray  Commonly known as:  FLONASE  Place 1 spray into both nostrils daily as needed for allergies or rhinitis.     omeprazole 20 MG capsule  Commonly known as:  PRILOSEC  Take 1 capsule (20 mg total) by mouth daily.     oxyCODONE-acetaminophen 5-325 MG per tablet  Commonly known as:  PERCOCET  Take 1-2 tablets by mouth every 4 (four) hours as needed for severe pain (take for severe pain not controlled with tramadol).     polyethylene glycol packet  Commonly known as:  MIRALAX / GLYCOLAX  Take 17 g by mouth daily as needed.     polyethylene glycol packet  Commonly known as:  MIRALAX / GLYCOLAX  Take 17 g by mouth daily as needed for mild constipation.     tamsulosin 0.4 MG Caps capsule  Commonly known as:  FLOMAX  Take 1 capsule (0.4 mg total) by mouth daily.     traMADol 50 MG tablet  Commonly known as:  ULTRAM  Take 1 tablet (50 mg total) by mouth every 6 (six) hours as needed (mild to moderate pain).     valsartan 160 MG tablet  Commonly known as:  DIOVAN  Take 1 tablet (160 mg total) by mouth daily.        FOLLOW UP VISIT:       Follow-up Information    Follow up with Senaida Lange, MD.   Specialty:   Orthopedic  Surgery   Why:  call to be seen in 10-14 days   Contact information:   [address] [place] [address] [phone]       Follow up with [facility]   Why:  Call monday morning to be seen for voiding trial   Contact information:   [address] [place] [address] [phone]       DISCHARGE TO: Skilled  DISPOSITION: Good   DISCHARGE CONDITION:  Rodolph Bong for Dr. Francena Hanly 05/01/2014, 9:17 AM    Discharge held, see additional progress note. Bed search for short term skilled being initiated    05/03/14  Patient has continued with mild level confusion and agitation and pulled foley out again this time with it becoming lodged in the penile shaft and inabililty to remove or advance requiring a urology consult who performed removal at bedside and cudet placement. This is to be left in place for 1 -2 weeks and follow up for voiding trial.  Social work was also consulted for short term nursing home placement as his wife felt she was unable to manage him in his current state in their home environment. Arrangements have been made for discharge today. [name] has remained medically and orthopedically stable throughout the admission with exception to his exacerbation of underlying dementia.

## 2014-05-01 NOTE — Clinical Social Work Note (Signed)
[facility]   Clinical Social Worker will sign off for now as social work intervention is no longer needed. Please consult us again if new need arises.  Derenda FennelBashira Sreshta Cressler, MSW, LCSWA [phone] 05/01/2014 11:39 AM

## 2014-05-01 NOTE — Progress Notes (Signed)
Patient continues to attempt to get OOB unassisted.  Bed and chair alarm on and working.  Patient is non-compliant with sling, he will not keep it on during ambulation/rest.  Given PRN valium, effective for +3hrs then patient still continues to get OOB unassisted.  PRN oxycodone IR 10mg  given, very effective and aided with agitation.  PA aware

## 2014-05-02 MED ORDER — FINASTERIDE 5 MG PO TABS
5.0000 mg | ORAL_TABLET | Freq: Every day | ORAL | Status: DC
  Administered 2014-05-03: 5 mg via ORAL
  Filled 2014-05-02: qty 1

## 2014-05-02 NOTE — Progress Notes (Signed)
Subjective: 3 Days Post-Op Procedure(s) (LRB): REVERSE TOTAL SHOULDER ARTHROPLASTY  (Right) Patient reports pain as mild.   Patient seen in rounds with Dr. Rennis ChrisSupple. Patient is well, but has had some minor complaints of pain in the right shoulder, requiring pain medications We will continue therapy today.  Had to have foley reinserted.  Will keep and likely discharge with foley in place.  Recommended to F/U with [facility] for voiding trial. Plan is to go Skilled nursing facility after hospital stay.  Patient reported that he has been noncompliant with therapy and now recommended to go to skilled facility.  Social worker note from yesterday morning stated they signed off. Will order social worker this morning since it looks like he WILL NEED placement.  Objective: Vital signs in last 24 hours: Temp:  [97.9 F (36.6 C)-100.8 F (38.2 C)] 98.2 F (36.8 C) (03/27 0442) Pulse Rate:  [92-102] 92 (03/27 0442) Resp:  [18] 18 (03/27 0442) BP: (95-115)/(61-85) 112/70 mmHg (03/27 0442) SpO2:  [92 %-96 %] 96 % (03/27 0442)  Intake/Output from previous day: 03/26 0701 - 03/27 0700 In: 360 [P.O.:360] Out: 1600 [Urine:1600]  No results for input(s): HGB in the last 72 hours. No results for input(s): WBC, RBC, HCT, PLT in the last 72 hours. No results for input(s): NA, K, CL, CO2, BUN, CREATININE, GLUCOSE, CALCIUM in the last 72 hours. No results for input(s): LABPT, INR in the last 72 hours.  EXAM General - Patient is Alert Extremity - Neurovascular intact Sensation intact distally Dressing - dressing C/D/I Motor Function - intact, moving hand and fingers well on exam.   Past Medical History  Diagnosis Date  . Heart attack   . Depression with anxiety     takes Clonazepam nightly  . RLS (restless legs syndrome)   . Unspecified vitamin D deficiency   . Vitamin B 12 deficiency   . Allergic rhinitis   . Coronary artery disease     s/p remote IWMI 2004 with PCI of the RCA with BMS  and repeat cath 2007 due to recurrent CP and abnormal nuclear stress test and cath showed widely patent RCA stent with 30-50% distal stenosis before the takeoff of a PL and PDA and normal LVF.   [name]. Hypertension     takes Diovan daily  . Constipation     takes Miralax daily as needed  . GERD (gastroesophageal reflux disease)     takes Omeprazole and Pepcid daily  . Hyperlipidemia     takes Atorvastatin daily  . History of bronchitis     pulmonologist is Dr.Wert.  . Arthritis   . Joint pain     Assessment/Plan: 3 Days Post-Op Procedure(s) (LRB): REVERSE TOTAL SHOULDER ARTHROPLASTY  (Right) Active Problems:   S/P shoulder replacement  Estimated body mass index is 29.35 kg/(m^2) as calculated from the following:   Height as of this encounter: 5\' 8"  (1.727 m).   Weight as of this encounter: 87.544 kg (193 lb). Up with therapy Discharge to SNF - possible tomorrow Consult Social Worker  Leave foley in place for now.  Douglas Peacerew Jerett Odonohue, PA-C Orthopaedic Surgery 05/02/2014, 7:29 AM

## 2014-05-02 NOTE — Clinical Social Work Note (Signed)
CSW received consult for patient to go to SNF for short term rehab.  CSW spoke to patient's wife [name] to discuss SNF placement options.  Patient's wife feels like it would benefit him to receive short term rehab.  CSW will complete FL2 and fax out patient.  Ervin KnackEric R. Dontavian Marchi, MSW, LCSWA [phone] 05/02/2014 11:28 AM

## 2014-05-02 NOTE — Clinical Social Work Note (Signed)
CSW spoke with patient and his wife Ander SladeJoy to discuss SNF placement.  Patient and wife were given bed offers, patient would like to go to [facility].  CSW contacted [facility] who said they have space available for patient, and admissions worker will follow up with unit CSW tomorrow, for discharge planning to SNF.  Ervin KnackEric R. Bethanie Bloxom, MSW, LCSWA [phone] 05/02/2014 2:30 PM

## 2014-05-02 NOTE — Clinical Social Work Psychosocial (Signed)
Clinical Social Work Department BRIEF PSYCHOSOCIAL ASSESSMENT 05/02/2014  Patient:  Douglas [name],Douglas [name]     Account Number:  [id number]     Admit date:  04/29/2014  Clinical Social Worker:  Elouise MunroeANTERHAUS,Usama Harkless, LCSWA  Date/Time:  05/02/2014 05:55 PM  Referred by:  Physician  Date Referred:  05/02/2014 Referred for  SNF Placement   Other Referral:   Interview type:  Patient Other interview type:   wife    PSYCHOSOCIAL DATA Living Status:  WIFE Admitted from facility:   Level of care:   Primary support name:  Douglas [name] Primary support relationship to patient:  SPOUSE Degree of support available:   Patient's wife is very supportive and caring    CURRENT CONCERNS Current Concerns  Post-Acute Placement   Other Concerns:    SOCIAL WORK ASSESSMENT / PLAN Patient is [name] 81 year old male who lives with his wife. Patient is alert and oriented x4.  Patient is talkative and friendly, however patient has some anxiety while in the hospital.  Patient is talking about the catheter bothering him and is having [name] hard time remebering to stand up correctly with his shoulder injury.  Patient's wife was at bedside and feels that patient would benefit from some short term rehab in order to get his strength up.  Patient and wife were explained the process for SNF placement and are in agreement to going.  Patient and wife did not have any questions expressed they understand the process. Patient to be discharged to SNF once he is medically ready and discharge orders have been received.   Assessment/plan status:  Psychosocial Support/Ongoing Assessment of Needs Other assessment/ plan:   Information/referral to community resources:    PATIENT'S/FAMILY'S RESPONSE TO PLAN OF CARE: Patient and wife in agreement to going to SNF for short term rehab.   Douglas KnackEric R. Keyonta Madrid, MSW, Theresia MajorsLCSWA [phone] 05/02/2014 6:02 PM

## 2014-05-02 NOTE — Progress Notes (Signed)
Rapid response RN came to room, assessed patient PA notified again.  Per PA, urology consult needed and will be ordered.  Patient continues to pull at site.  Family aware

## 2014-05-02 NOTE — Progress Notes (Signed)
Occupational Therapy Treatment Patient Details Name: Douglas PentonRalph [name] MRN: [id number] DOB: 09/03/1931 Today's Date: 05/02/2014    History of present illness Pt is a 81 y/o M s/p R reverse total shoulder arthroplasty.  Pt's PMH includes heart attack, restless legg syndrome, HTN, and CAD.   OT comments  Performed exercises and ADLs. Spouse seems to feel more comfortable with pt going to SNF versus home.   Follow Up Recommendations  SNF;Supervision/Assistance - 24 hour    Equipment Recommendations  Tub/shower seat    Recommendations for Other Services      Precautions / Restrictions Precautions Precautions: Shoulder Type of Shoulder Precautions: passive protocol. FF 90; Abd 60; ER 30; AROM elbow/wrist/hand; no AROM shoulder; pendulums OK; OT spoke with [name] PA and she states it is okay to use RUE for ADLs; no pushing, pulling, lifting with RUE (can use to manage light items) Shoulder Interventions: Shoulder sling/immobilizer;Off for dressing/bathing/exercises Precaution Booklet Issued:  (given previous session) Precaution Comments: educated on shoulder precautions Required Braces or Orthoses: Sling Restrictions Weight Bearing Restrictions: Yes RUE Weight Bearing: Non weight bearing       Mobility Bed Mobility Overal bed mobility: Needs Assistance Bed Mobility: Sit to Supine;Supine to Sit     Supine to sit: Min assist Sit to supine: Modified independent (Device/Increase time)   General bed mobility comments: wife assisted pt with his legs  Transfers Overall transfer level: Needs assistance Equipment used: None Transfers: Sit to/from Stand Sit to Stand: Min guard;Min assist              Balance    Min guard for ambulation                                 ADL Overall ADL's : Needs assistance/impaired     Grooming: Wash/dry face;Set up;Supervision/safety;Standing   Upper Body Bathing: Standing;Minimal assitance Upper Body Bathing Details (indicate  cue type and reason): educated on technique Lower Body Bathing: Min guard (standing; washed buttocks)           Toilet Transfer: Min guard;Ambulation (chair/bed)           Functional mobility during ADLs: Min guard        Vision                     Perception     Praxis      Cognition  Awake/Alert Behavior During Therapy: Flat affect Overall Cognitive Status: History of cognitive impairments - at baseline (oriented pt to where we were at)                       [name]               Exercises Other Exercises Other Exercises: Performed pendulum exercises standing-alot of assist given at hips/cues given for technique. Other Exercises: Performed right elbow flexion/extension, moved digits, and wrist flexion/extension (AROM).  Other Exercises: Performed Rt shoulder flexion, external rotation, and abduction exercises in supine (OT tried to maintain performing PROM with cues given to pt).   Shoulder Instructions Shoulder Instructions Donning/doffing shirt without moving shoulder:  (educated caregiver on technique and suggested button up top) Method for sponge bathing under operated UE: Minimal assistance Donning/doffing sling/immobilizer: Supervision/safety (wife practiced donning sling) Correct positioning of sling/immobilizer: Minimal assistance Pendulum exercises (written home exercise program): Maximal assistance ROM for elbow, wrist and digits of operated UE: Min-guard Sling  wearing schedule (on at all times/off for ADL's):  (educated ) Proper positioning of operated UE when showering:  (educated on leaning forward to wash under arm as well technique to wash under left arm) Positioning of UE while sleeping:  (educated)     General Comments      Pertinent Vitals/ Pain       Pain Assessment: 0-10 Pain Score:  (15-16) Pain Location: Rt shoulder and at catheter site Pain Descriptors / Indicators: Sore (sore at catheter  site) Pain Intervention(s): Monitored during session;Repositioned;Ice applied  Home Living                                          Prior Functioning/Environment              Frequency Min 3X/week     Progress Toward Goals  OT Goals(current goals can now be found in the care plan section)  Progress towards OT goals: Progressing toward goals  Acute Rehab OT Goals Patient Stated Goal: not stated OT Goal Formulation: Patient unable to participate in goal setting Time For Goal Achievement: 05/07/14 Potential to Achieve Goals: Good ADL Goals Pt Will Perform Upper Body Bathing: with caregiver independent in assisting;with supervision;sitting Pt Will Perform Lower Body Bathing: with supervision;sit to/from stand;with caregiver independent in assisting Pt Will Perform Upper Body Dressing: with supervision;with caregiver independent in assisting;sitting Pt Will Perform Lower Body Dressing: with supervision;sit to/from stand;with caregiver independent in assisting Pt Will Transfer to Toilet: with supervision;ambulating;regular height toilet (with caregiver assisting) Pt Will Perform Toileting - Clothing Manipulation and hygiene: with supervision;with caregiver independent in assisting;sit to/from stand Pt/caregiver will Perform Home Exercise Program: Right Upper extremity;With written HEP provided;With Supervision (with caregiver assisting)  Plan Discharge plan remains appropriate    Co-evaluation                 End of Session Equipment Utilized During Treatment: Gait belt;Other (comment) (sling)   Activity Tolerance Patient tolerated treatment well   Patient Left in bed;with call bell/phone within reach;with bed alarm set;with family/visitor present   Nurse Communication Mobility status;Other (comment) (sling wearing schedule)        Time: 4782-9562 OT Time Calculation (min): 33 min  Charges: OT General Charges $OT Visit: 1 Procedure OT  Treatments $Self Care/Home Management : 8-22 mins $Therapeutic Exercise: 8-22 mins  Earlie Raveling OTR/L [phone] 05/02/2014, 2:25 PM

## 2014-05-02 NOTE — Progress Notes (Signed)
When this RN returned to room, patient had pulled catheter almost completely out.  Syringe attached to port for release of NS in bulb, no noted fluid/urine expelled.  Upon palpitation assessment of penis, noted 2-3 inches of foley tubing is felt.  Even after cutting catheter to release bulb pressure, catheter is not able to be removed at this time d/t possible swelling and trauma to urethra.  MD notified

## 2014-05-02 NOTE — Progress Notes (Signed)
Dr Kathrynn RunningManning of urology called, verbal orders for patient; call OR and obtain urology cart for bedside; pants for patient for  post procedure.  Cart at bedside, family aware and at bedside.  PA aware. Family visiting with patient at this time.

## 2014-05-02 NOTE — Consult Note (Signed)
Reason for Consult: Foley Trauma, Urinary Retention with Enlarged Prostate  Referring Physician: Avel Peace, PA  [name] is an 81 y.o. male.   HPI:   1 - Foley Trauma - pt with baseline dementia, has been pulling of foley x several days after shoulder surgery and pulled into distal urethra afternoon of 3/27 and NSG unable to advance or remove or deflate balloon, balloon still deflated after cutting of foley valve.   2 - Urinary Retention with Enlarged Prostate - long h/o mild obstructive symptoms per family but no prior frank retention, developed retention after elective rt shoulder surgery and catheter replaced and started on alpha blocker. DRE 80gm smooth. Now started on tamsulosin + finasteride this admission.  Today [name] is seen as emergent consultation for above.   Past Medical History  Diagnosis Date  . Heart attack   . Depression with anxiety     takes Clonazepam nightly  . RLS (restless legs syndrome)   . Unspecified vitamin D deficiency   . Vitamin B 12 deficiency   . Allergic rhinitis   . Coronary artery disease     s/p remote IWMI 2004 with PCI of the RCA with BMS and repeat cath 2007 due to recurrent CP and abnormal nuclear stress test and cath showed widely patent RCA stent with 30-50% distal stenosis before the takeoff of a PL and PDA and normal LVF.   [name] Hypertension     takes Diovan daily  . Constipation     takes Miralax daily as needed  . GERD (gastroesophageal reflux disease)     takes Omeprazole and Pepcid daily  . Hyperlipidemia     takes Atorvastatin daily  . History of bronchitis     pulmonologist is Dr.Wert.  . Arthritis   . Joint pain     Past Surgical History  Procedure Laterality Date  . Stents    . Nasal sinus surgery    . Hand surgery Bilateral   . Hernia repair Left     inguinal   . Cardiac catheterization  2004  . Coronary angioplasty      1 stent  . Colonoscopy    . Esophagogastroduodenoscopy    . Cataract surgery       Family History  Problem Relation Age of Onset  . Cancer - Prostate Father   . Asthma Mother     Social History:  reports that he quit smoking about 50 years ago. His smoking use included Cigarettes. He has a 45 pack-year smoking history. He does not have any smokeless tobacco history on file. He reports that he does not drink alcohol or use illicit drugs.  Allergies: No Known Allergies  Medications: I have reviewed the patient's current medications.  No results found for this or any previous visit (from the past 48 hour(s)).  No results found.  Review of Systems  Constitutional: Negative.   HENT: Negative.   Eyes: Negative.   Respiratory: Negative.   Cardiovascular: Negative.   Gastrointestinal: Negative.   Genitourinary: Negative.   Musculoskeletal: Negative.   Skin: Negative.   Neurological: Negative.   Endo/Heme/Allergies: Negative.   Psychiatric/Behavioral: Negative.    Blood pressure 122/72, pulse 73, temperature 99.5 F (37.5 C), temperature source Oral, resp. rate 16, height  (1.727 m), weight 87.544 kg (193 lb), SpO2 98 %. Physical Exam  Constitutional: He appears well-developed and well-nourished.  Wife and extended family at bedside  HENT:  Head: Normocephalic.  Eyes: Pupils are equal, round, and reactive to  light.  Neck: Normal range of motion.  Cardiovascular: Normal rate.   Respiratory: Effort normal.  GI: Soft.  Genitourinary:  Distal 1/2 of foley catheter in place, having been previously appropriately cut to try to deflate balloon. Balloon palpable mid penile shaft.   Musculoskeletal:  RUE sling in place  Neurological: He is alert.  AOx2, stigmata of moderate dementia, very pleasant and cooperative.  Skin: Skin is warm.  Psychiatric: He has a normal mood and affect.    Assessment/Plan:  1 - Foley Trauma - impressive case.   Using aseptic technique area of penile shaft overlying palpable balloon prepped with betadine and small insulin  syringe sed to pop balloon through ventral penis. Immediate release of catheter and grossly bloody urine. New 27F coude placed per urethra with 10cc sterile water in balloon and hand irrigated with 60cc aliquots sterile water 500cc to clear. Pressure gauze dressing applied to penile shaft to combat urethral venous oozing from pendulous urethral injury from his prior catheter balloon. Importance of non-displacment and continued catheter x 1-2 weeks discussed with pt and family.   Suggest keeping pt in pants with foley tubing going through pant leg or connect to leg bag to keep pt from grabbing tubing when confused. I placed pants on his in this arrangement with NSG help and wife observing.   2 - Urinary Retention with Enlarged Prostate - continue finasteride and tamsulosin at discharge. Will need Urol f./u for trial of void in 1-2 weeks. Please call [phone] to schedule M-F 8-5.   3 - Call with questions anytime.    [name] 05/02/2014, 7:43 PM

## 2014-05-02 NOTE — Progress Notes (Signed)
RN called to room d/t bed alarm activation, patient is attempting to get OOB unassisted when noted scant amount of blood on foley cath tubing from penis.  Patient has been touching/pulling at F/C site all shift.  Educated pt about complications if foley is pulled out by force.  Ice applied to scrotal area and penis d/t swelling.

## 2014-05-02 NOTE — Progress Notes (Signed)
Called by Koleen NimrodAdrian, bedside RN re difficulty with foley removal.  RN sts pt has been pulling at catheter and has partially removed catheter.  On assessment, catheter noted to have been cut, with approx 6 inches remaining exposed.  Distal end of catheter palpated in pt's penis.  Advised staff not to further attempt removal, call MD and obtain urology consult.  Pt in no distress, VSS, no pain without manipulation of foley.

## 2014-05-02 NOTE — Clinical Social Work Placement (Addendum)
Clinical Social Work Department CLINICAL SOCIAL WORK PLACEMENT NOTE 05/02/2014  Patient:  [name],[name]  Account Number:  [id number] Admit date:  04/29/2014  Clinical Social Worker:  ERIC ANTERHAUS, LCSWA  Date/time:  05/02/2014 06:03 PM  Clinical Social Work is seeking post-discharge placement for this patient at the following level of care:   SKILLED NURSING   (*CSW will update this form in Epic as items are completed)   05/02/2014  Patient/family provided with [facility] of Clinical Social Work's list of facilities offering this level of care within the geographic area requested by the patient (or if unable, by the patient's family).  05/02/2014  Patient/family informed of their freedom to choose among providers that offer the needed level of care, that participate in Medicare, Medicaid or managed care program needed by the patient, have an available bed and are willing to accept the patient.  05/02/2014  Patient/family informed of [facility] ownership interest in [facility], as well as of the fact that they are under no obligation to receive care at this facility.  PASARR submitted to EDS on 05/02/2014 PASARR number received on 05/02/2014  FL2 transmitted to all facilities in geographic area requested by pt/family on  05/02/2014 FL2 transmitted to all facilities within larger geographic area on 05/02/2014  Patient informed that his/her managed care company has contracts with or will negotiate with  certain facilities, including the following:     Patient/family informed of bed offers received:  05/02/2014 [facility] Physician recommends and patient chooses bed at    [facility]   Patient to be transferred to facility by [facility] Patient and family notified of transfer on 05/02/2024 Name of family member notified:  Wife, [name] at bedside and patient is alert and  oriented  The following physician request were entered in Epic: Physician Request  Please sign FL2.    Additional Comments:  Ervin Knackric R. Anterhaus, MSW, Theresia MajorsLCSWA [phone] 05/02/2014 6:05 PM

## 2014-05-02 NOTE — Progress Notes (Signed)
PA returned call, referred to call urology RN, no new information given for removal of catheter tubing.  Pt given pain medication before attempting to gently pull foley out.  Ice also applied.  Rapid response RN notified for additional assessment.  Patient tolerating well although he continues to pull at site.  Will continue to monitor for status changes.

## 2014-05-03 ENCOUNTER — Encounter (HOSPITAL_COMMUNITY): Payer: Self-pay | Admitting: Orthopedic Surgery

## 2014-05-03 DIAGNOSIS — Z9889 Other specified postprocedural states: Secondary | ICD-10-CM | POA: Diagnosis not present

## 2014-05-03 DIAGNOSIS — Y998 Other external cause status: Secondary | ICD-10-CM | POA: Diagnosis not present

## 2014-05-03 DIAGNOSIS — Z96611 Presence of right artificial shoulder joint: Secondary | ICD-10-CM | POA: Diagnosis not present

## 2014-05-03 DIAGNOSIS — F4489 Other dissociative and conversion disorders: Secondary | ICD-10-CM | POA: Diagnosis not present

## 2014-05-03 DIAGNOSIS — S8991XA Unspecified injury of right lower leg, initial encounter: Secondary | ICD-10-CM | POA: Diagnosis not present

## 2014-05-03 DIAGNOSIS — K219 Gastro-esophageal reflux disease without esophagitis: Secondary | ICD-10-CM | POA: Diagnosis not present

## 2014-05-03 DIAGNOSIS — I2510 Atherosclerotic heart disease of native coronary artery without angina pectoris: Secondary | ICD-10-CM | POA: Diagnosis not present

## 2014-05-03 DIAGNOSIS — Z471 Aftercare following joint replacement surgery: Secondary | ICD-10-CM | POA: Diagnosis not present

## 2014-05-03 DIAGNOSIS — S299XXA Unspecified injury of thorax, initial encounter: Secondary | ICD-10-CM | POA: Diagnosis not present

## 2014-05-03 DIAGNOSIS — I6789 Other cerebrovascular disease: Secondary | ICD-10-CM | POA: Diagnosis not present

## 2014-05-03 DIAGNOSIS — F418 Other specified anxiety disorders: Secondary | ICD-10-CM | POA: Diagnosis not present

## 2014-05-03 DIAGNOSIS — E785 Hyperlipidemia, unspecified: Secondary | ICD-10-CM | POA: Diagnosis not present

## 2014-05-03 DIAGNOSIS — N179 Acute kidney failure, unspecified: Secondary | ICD-10-CM | POA: Diagnosis not present

## 2014-05-03 DIAGNOSIS — S0531XA Ocular laceration without prolapse or loss of intraocular tissue, right eye, initial encounter: Secondary | ICD-10-CM | POA: Diagnosis not present

## 2014-05-03 DIAGNOSIS — T148 Other injury of unspecified body region: Secondary | ICD-10-CM | POA: Diagnosis not present

## 2014-05-03 DIAGNOSIS — S0180XA Unspecified open wound of other part of head, initial encounter: Secondary | ICD-10-CM | POA: Diagnosis not present

## 2014-05-03 DIAGNOSIS — Y846 Urinary catheterization as the cause of abnormal reaction of the patient, or of later complication, without mention of misadventure at the time of the procedure: Secondary | ICD-10-CM | POA: Diagnosis not present

## 2014-05-03 DIAGNOSIS — R2681 Unsteadiness on feet: Secondary | ICD-10-CM | POA: Diagnosis not present

## 2014-05-03 DIAGNOSIS — J309 Allergic rhinitis, unspecified: Secondary | ICD-10-CM | POA: Diagnosis not present

## 2014-05-03 DIAGNOSIS — Y9389 Activity, other specified: Secondary | ICD-10-CM | POA: Diagnosis not present

## 2014-05-03 DIAGNOSIS — S4991XA Unspecified injury of right shoulder and upper arm, initial encounter: Secondary | ICD-10-CM | POA: Diagnosis not present

## 2014-05-03 DIAGNOSIS — T8189XA Other complications of procedures, not elsewhere classified, initial encounter: Secondary | ICD-10-CM | POA: Diagnosis not present

## 2014-05-03 DIAGNOSIS — R0602 Shortness of breath: Secondary | ICD-10-CM | POA: Diagnosis not present

## 2014-05-03 DIAGNOSIS — N139 Obstructive and reflux uropathy, unspecified: Secondary | ICD-10-CM | POA: Diagnosis not present

## 2014-05-03 DIAGNOSIS — N390 Urinary tract infection, site not specified: Secondary | ICD-10-CM | POA: Diagnosis not present

## 2014-05-03 DIAGNOSIS — R319 Hematuria, unspecified: Secondary | ICD-10-CM | POA: Diagnosis not present

## 2014-05-03 DIAGNOSIS — Y9289 Other specified places as the place of occurrence of the external cause: Secondary | ICD-10-CM | POA: Diagnosis not present

## 2014-05-03 DIAGNOSIS — R279 Unspecified lack of coordination: Secondary | ICD-10-CM | POA: Diagnosis not present

## 2014-05-03 DIAGNOSIS — Z9861 Coronary angioplasty status: Secondary | ICD-10-CM | POA: Diagnosis not present

## 2014-05-03 DIAGNOSIS — D649 Anemia, unspecified: Secondary | ICD-10-CM | POA: Diagnosis not present

## 2014-05-03 DIAGNOSIS — K5900 Constipation, unspecified: Secondary | ICD-10-CM | POA: Diagnosis not present

## 2014-05-03 DIAGNOSIS — T83198A Other mechanical complication of other urinary devices and implants, initial encounter: Secondary | ICD-10-CM | POA: Diagnosis not present

## 2014-05-03 DIAGNOSIS — R451 Restlessness and agitation: Secondary | ICD-10-CM | POA: Diagnosis not present

## 2014-05-03 DIAGNOSIS — R310 Gross hematuria: Secondary | ICD-10-CM | POA: Diagnosis not present

## 2014-05-03 DIAGNOSIS — M6281 Muscle weakness (generalized): Secondary | ICD-10-CM | POA: Diagnosis not present

## 2014-05-03 DIAGNOSIS — M1990 Unspecified osteoarthritis, unspecified site: Secondary | ICD-10-CM | POA: Diagnosis not present

## 2014-05-03 DIAGNOSIS — Z8709 Personal history of other diseases of the respiratory system: Secondary | ICD-10-CM | POA: Diagnosis not present

## 2014-05-03 DIAGNOSIS — Z7951 Long term (current) use of inhaled steroids: Secondary | ICD-10-CM | POA: Diagnosis not present

## 2014-05-03 DIAGNOSIS — F0390 Unspecified dementia without behavioral disturbance: Secondary | ICD-10-CM | POA: Diagnosis not present

## 2014-05-03 DIAGNOSIS — M7989 Other specified soft tissue disorders: Secondary | ICD-10-CM | POA: Diagnosis not present

## 2014-05-03 DIAGNOSIS — I10 Essential (primary) hypertension: Secondary | ICD-10-CM | POA: Diagnosis not present

## 2014-05-03 DIAGNOSIS — R339 Retention of urine, unspecified: Secondary | ICD-10-CM | POA: Diagnosis not present

## 2014-05-03 DIAGNOSIS — R509 Fever, unspecified: Secondary | ICD-10-CM | POA: Diagnosis not present

## 2014-05-03 DIAGNOSIS — M19011 Primary osteoarthritis, right shoulder: Secondary | ICD-10-CM | POA: Diagnosis not present

## 2014-05-03 DIAGNOSIS — T839XXA Unspecified complication of genitourinary prosthetic device, implant and graft, initial encounter: Secondary | ICD-10-CM | POA: Diagnosis not present

## 2014-05-03 DIAGNOSIS — S99921A Unspecified injury of right foot, initial encounter: Secondary | ICD-10-CM | POA: Diagnosis not present

## 2014-05-03 DIAGNOSIS — F0391 Unspecified dementia with behavioral disturbance: Secondary | ICD-10-CM | POA: Diagnosis not present

## 2014-05-03 DIAGNOSIS — Z87891 Personal history of nicotine dependence: Secondary | ICD-10-CM | POA: Diagnosis not present

## 2014-05-03 DIAGNOSIS — F322 Major depressive disorder, single episode, severe without psychotic features: Secondary | ICD-10-CM | POA: Diagnosis not present

## 2014-05-03 DIAGNOSIS — T83098A Other mechanical complication of other indwelling urethral catheter, initial encounter: Secondary | ICD-10-CM | POA: Diagnosis not present

## 2014-05-03 DIAGNOSIS — M25511 Pain in right shoulder: Secondary | ICD-10-CM | POA: Diagnosis not present

## 2014-05-03 DIAGNOSIS — I252 Old myocardial infarction: Secondary | ICD-10-CM | POA: Diagnosis not present

## 2014-05-03 DIAGNOSIS — J302 Other seasonal allergic rhinitis: Secondary | ICD-10-CM | POA: Diagnosis not present

## 2014-05-03 DIAGNOSIS — N138 Other obstructive and reflux uropathy: Secondary | ICD-10-CM | POA: Diagnosis not present

## 2014-05-03 DIAGNOSIS — M79671 Pain in right foot: Secondary | ICD-10-CM | POA: Diagnosis not present

## 2014-05-03 DIAGNOSIS — Z7982 Long term (current) use of aspirin: Secondary | ICD-10-CM | POA: Diagnosis not present

## 2014-05-03 DIAGNOSIS — S199XXA Unspecified injury of neck, initial encounter: Secondary | ICD-10-CM | POA: Diagnosis not present

## 2014-05-03 DIAGNOSIS — Z79899 Other long term (current) drug therapy: Secondary | ICD-10-CM | POA: Diagnosis not present

## 2014-05-03 DIAGNOSIS — G2581 Restless legs syndrome: Secondary | ICD-10-CM | POA: Diagnosis not present

## 2014-05-03 DIAGNOSIS — N401 Enlarged prostate with lower urinary tract symptoms: Secondary | ICD-10-CM | POA: Diagnosis not present

## 2014-05-03 DIAGNOSIS — R2241 Localized swelling, mass and lump, right lower limb: Secondary | ICD-10-CM | POA: Diagnosis not present

## 2014-05-03 DIAGNOSIS — T8351XA Infection and inflammatory reaction due to indwelling urinary catheter, initial encounter: Secondary | ICD-10-CM | POA: Diagnosis not present

## 2014-05-03 DIAGNOSIS — M79661 Pain in right lower leg: Secondary | ICD-10-CM | POA: Diagnosis not present

## 2014-05-03 MED ORDER — FINASTERIDE 5 MG PO TABS: 5.0000 mg | ORAL_TABLET | Freq: Every day | ORAL | Status: DC

## 2014-05-03 NOTE — Discharge Planning (Addendum)
Patient will discharge today per MD order. [facility] RN to call report prior to transportation to [phone] Transportation: [facility] - wife completing admission's paperwork at [facility]11:30am Insurance authorization has been received: [id number] Corrie Dandy[facility]  CSW sent discharge summary to SNF for review.  Packet is complete.  RN, patient and family aware of discharge plans.  Vickii PennaGina Jodene Polyak, LCSWA [phone]  Psychiatric & Orthopedics (5N 1-16) Clinical Social Worker

## 2014-05-03 NOTE — Progress Notes (Signed)
Physical Therapy Treatment Patient Details Name: [name] MRN: [id number] DOB: 1932/01/07 Today's Date: 05/03/2014    History of Present Illness Pt is a 81 y/o M s/p R reverse total shoulder arthroplasty.  Pt has been demonstrating poor balance and altereed mental status since surgery.  Pt's PMH includes heart attack, restless legg syndrome, HTN, and CAD.    PT Comments    PT session was limited to sit>stand exercise at EOB and 5 ft ambulation 2/2 pt's poor standing balance 2/2 cognitive status.  Transportation team was present to transport pt at end of session.  PT is changing d/c recommendation to SNF 2/2 pt's altered mental status which is affecting pt's ability to safely ambulate.  Pt will benefit from continued skilled PT in the SNF setting to improve pt's balance and safe use of single point cane to increase functional independence.     Follow Up Recommendations  SNF;Supervision/Assistance - 24 hour     Equipment Recommendations  Cane    Recommendations for Other Services       Precautions / Restrictions Precautions Precautions: Shoulder Type of Shoulder Precautions: passive protocol. FF 90; Abd 60; ER 30; AROM elbow/wrist/hand; no AROM shoulder; pendulums OK; OT spoke with [name] PA and she states it is okay to use RUE for ADLs; no pushing, pulling, lifting with RUE (can use to manage light items) Shoulder Interventions: Shoulder sling/immobilizer;Off for dressing/bathing/exercises Precaution Comments: pt unable to follow shoulder precuations Required Braces or Orthoses: Sling Restrictions Weight Bearing Restrictions: Yes RUE Weight Bearing: Non weight bearing    Mobility  Bed Mobility Overal bed mobility: Needs Assistance Bed Mobility: Supine to Sit     Supine to sit: Supervision     General bed mobility comments: verbal cues for scooting toward EOB so legs are resting on floor to maintain sitting balance  Transfers Overall transfer level: Needs  assistance Equipment used: 1 person hand held assist Transfers: Sit to/from Stand Sit to Stand: Min assist (for balance)         General transfer comment: Min assist required to maintain pt's balance.  Pt's confusion w/ catheter caused pt to kick up his legs and lose balance posteriorly from standing to sitting EOB  Ambulation/Gait Ambulation/Gait assistance: Min guard Ambulation Distance (Feet): 5 Feet Assistive device: 1 person hand held assist Gait Pattern/deviations: Step-through pattern;Scissoring;Staggering left;Staggering right;Leaning posteriorly;Antalgic   Gait velocity interpretation: Below normal speed for age/gender General Gait Details: Pt staggering R and L 2/2 confusion about catheter but is able to maintain his balance w/ hand held assist of one person   Stairs            Wheelchair Mobility    Modified Rankin (Stroke Patients Only)       Balance Overall balance assessment: Needs assistance Sitting-balance support: Feet supported;No upper extremity supported Sitting balance-Leahy Scale: Fair     Standing balance support: Single extremity supported Standing balance-Leahy Scale: Poor                      Cognition Arousal/Alertness: Awake/alert Behavior During Therapy: Restless;Impulsive Overall Cognitive Status: Impaired/Different from baseline Area of Impairment: Orientation;Safety/judgement Orientation Level: Disoriented to;Place;Situation   Memory: Decreased short-term memory;Decreased recall of precautions   Safety/Judgement: Decreased awareness of safety;Decreased awareness of deficits     General Comments: Pt appears confused and annoyed about catheter which causes pt to lose his balance during session    Exercises Shoulder Exercises Shoulder Flexion: AAROM;Right;10 reps;Supine (to 80. pt unable to participate  with PROM) Shoulder ABduction: AAROM;Right;10 reps;Supine (to 60) Shoulder External Rotation: AAROM;Right;10  reps;Supine (to 30) Elbow Flexion: AROM;Right;10 reps;Supine Elbow Extension: AROM;Right;10 reps;Supine Wrist Flexion: AROM;Right;10 reps;Supine Wrist Extension: AROM;Right;10 reps;Supine Digit Composite Flexion: AROM;Right;10 reps;Supine Composite Extension: AROM;Right;10 reps Other Exercises Other Exercises: Sit to Stand x8 and min guard at EOB    General Comments General comments (skin integrity, edema, etc.): Pt's altered mental status is affecting his ability to ambulate and mobilize safely      Pertinent Vitals/Pain Pain Assessment: Faces Faces Pain Scale: Hurts a little bit Pain Location: w/ bed mobility Pain Descriptors / Indicators: Grimacing Pain Intervention(s): Limited activity within patient's tolerance;Monitored during session;Repositioned    Home Living                      Prior Function            PT Goals (current goals can now be found in the care plan section) Acute Rehab PT Goals Patient Stated Goal: not stated Progress towards PT goals: Not progressing toward goals - comment (2/2 impaired balance 2/2 impaired cognition)    Frequency  Min 2X/week    PT Plan Discharge plan needs to be updated    Co-evaluation             End of Session Equipment Utilized During Treatment: Gait belt Activity Tolerance: Patient tolerated treatment well Patient left: with family/visitor present (on ambulatory transport bed w/ transport team)     Time: [id number] PT Time Calculation (min) (ACUTE ONLY): 14 min  Charges:  $Therapeutic Activity: 8-22 mins                    G CodesMichail Jewels:      Ashley Parr PT, [place]PT [phone] 05/03/2014, 2:21 PM

## 2014-05-03 NOTE — Care Management Note (Signed)
CARE MANAGEMENT NOTE 05/03/2014  Patient:  Veneta PentonURNER,Zyheir A   Account Number:  [id number]  Date Initiated:  04/30/2014  Documentation initiated by:  Vance PeperBRADY,Ilean Spradlin  Subjective/Objective Assessment:   81 yr old male admitted with osteoarthritis of right shoulder. Patient underwent a right total reverse shoulder arthroplasty.     Action/Plan:   3/28- Patient will need shortterm rehab at SNF. Social Worker is aware. Patient will go to [facility].   Anticipated DC Date:  05/03/2014   Anticipated DC Plan:  SKILLED NURSING FACILITY  In-house referral  Clinical Social Worker      DC Associate Professorlanning Services  CM consult      [facility]  [facility]   Choice offered to / List presented to:          [facility]  [facility]  HH[facility]3 OT      [facility]  [facility]   Status of service:  Completed, signed off Medicare Important Message given?  YES (If response is "NO", the following Medicare IM given date fields will be blank) Date Medicare IM given:  05/03/2014 Medicare IM given by:  Vance PeperBRADY,Mycah Mcdougall Date Additional Medicare IM given:   Additional Medicare IM given by:    Discharge Disposition:  SKILLED NURSING FACILITY  Per UR Regulation:    If discussed at Long Length of Stay Meetings, dates discussed:    Comments:  04/30/14 4:00pm Vance PeperSusan Montasia Chisenhall, RN BSN Case Manager Patient is confused, unable to discuss payment for tub bench. Will ask weekend CM to followup.

## 2014-05-03 NOTE — Progress Notes (Signed)
Veneta Pentonalph A Setter to be D/C'd Skilled nursing facility per MD order. Discussed with the patient and all questions fully answered.    Medication List    TAKE these medications        aspirin 81 MG tablet  Take 81 mg by mouth daily.     atorvastatin 20 MG tablet  Commonly known as:  LIPITOR  Take 1 tablet (20 mg total) by mouth daily.     bisacodyl 5 MG EC tablet  Commonly known as:  DULCOLAX  Take 1 tablet (5 mg total) by mouth daily as needed for moderate constipation.     CEREFOLIN NAC 6-2-600 MG Tabs  Take one tablet by mouth one time daily     cetirizine-pseudoephedrine 5-120 MG per tablet  Commonly known as:  ZYRTEC-D  Take 1 tablet by mouth 2 (two) times daily.     clonazePAM 0.5 MG tablet  Commonly known as:  KLONOPIN  Take 0.5 mg by mouth at bedtime.     diazepam 5 MG tablet  Commonly known as:  VALIUM  Take 0.5-1 tablets (2.5-5 mg total) by mouth every 6 (six) hours as needed for muscle spasms or sedation.     docusate sodium 100 MG capsule  Commonly known as:  COLACE  Take 1 capsule (100 mg total) by mouth 2 (two) times daily.     famotidine 20 MG tablet  Commonly known as:  PEPCID  Take 1 tablet (20 mg total) by mouth 2 (two) times daily.     finasteride 5 MG tablet  Commonly known as:  PROSCAR  Take 1 tablet (5 mg total) by mouth daily.     fluticasone 50 MCG/ACT nasal spray  Commonly known as:  FLONASE  Place 1 spray into both nostrils daily as needed for allergies or rhinitis.     omeprazole 20 MG capsule  Commonly known as:  PRILOSEC  Take 1 capsule (20 mg total) by mouth daily.     oxyCODONE-acetaminophen 5-325 MG per tablet  Commonly known as:  PERCOCET  Take 1-2 tablets by mouth every 4 (four) hours as needed for severe pain (take for severe pain not controlled with tramadol).     polyethylene glycol packet  Commonly known as:  MIRALAX / GLYCOLAX  Take 17 g by mouth daily as needed.     polyethylene glycol packet  Commonly known as:  MIRALAX  / GLYCOLAX  Take 17 g by mouth daily as needed for mild constipation.     tamsulosin 0.4 MG Caps capsule  Commonly known as:  FLOMAX  Take 1 capsule (0.4 mg total) by mouth daily.     traMADol 50 MG tablet  Commonly known as:  ULTRAM  Take 1 tablet (50 mg total) by mouth every 6 (six) hours as needed (mild to moderate pain).     valsartan 160 MG tablet  Commonly known as:  DIOVAN  Take 1 tablet (160 mg total) by mouth daily.        VVS, Skin clean, dry and intact without evidence of skin break down, no evidence of skin tears noted.  IV catheter discontinued intact. Site without signs and symptoms of complications. Dressing and pressure applied.  An After Visit Summary was printed and given to the patient.  Patient escorted via stretcher, and D/C SNF via ambulance.  Kai LevinsJacobs, Safa Derner N  05/03/2014 3:47 PM

## 2014-05-03 NOTE — Progress Notes (Signed)
Occupational Therapy Treatment Patient Details Name: [name] MRN: [id number] DOB: January 02, 1932 Today's Date: 05/03/2014    History of present illness Pt is a 81 y/o M s/p R reverse total shoulder arthroplasty.  Pt's PMH includes heart attack, restless legg syndrome, HTN, and CAD.   OT comments  Pt using RUE functionally. Participated in RUE ROM - unable to maintain PROM as ordered. Pt lying in bed without sling on. ROM @ 80 FF; 60 Abd; 30 ER. Recommended use of waist strap to inhibit pt from pushing and pulling with RUE. Continue to recommend SNF due to wife unable to care for pt at this level and to maximize safety and independence with ADL and functional mobility.   Follow Up Recommendations  SNF;Supervision/Assistance - 24 hour    Equipment Recommendations  Tub/shower seat    Recommendations for Other Services      Precautions / Restrictions Precautions Precautions: Shoulder Type of Shoulder Precautions: passive protocol. FF 90; Abd 60; ER 30; AROM elbow/wrist/hand; no AROM shoulder; pendulums OK; OT spoke with [name] PA and she states it is okay to use RUE for ADLs; no pushing, pulling, lifting with RUE (can use to manage light items) Shoulder Interventions: Shoulder sling/immobilizer;Off for dressing/bathing/exercises Precaution Comments: pt unable to follow shoulder precuations Required Braces or Orthoses: Sling Restrictions RUE Weight Bearing: Non weight bearing              ADL Overall ADL's : Needs assistance/impaired                                       General ADL Comments: Max A to donn sling. sling off, lying at footof bed on entry                                      Cognition   Behavior During Therapy: Restless Overall Cognitive Status: No family/caregiver present to determine baseline cognitive functioning (unsure of baseline)    oriented to self and situation only                    Extremity/Trunk  Assessment   Pt moving RUE actively. See ROM below            Exercises Shoulder Exercises Shoulder Flexion: AAROM;Right;10 reps;Supine (to 80. pt unable to participate with PROM) Shoulder ABduction: AAROM;Right;10 reps;Supine (to 60) Shoulder External Rotation: AAROM;Right;10 reps;Supine (to 30) Elbow Flexion: AROM;Right;10 reps;Supine Elbow Extension: AROM;Right;10 reps;Supine Wrist Flexion: AROM;Right;10 reps;Supine Wrist Extension: AROM;Right;10 reps;Supine Digit Composite Flexion: AROM;Right;10 reps;Supine Composite Extension: AROM;Right;10 reps   Shoulder Instructions  appears less painful today.     General Comments      Pertinent Vitals/ Pain       Pain Assessment: Faces Faces Pain Scale: Hurts a little bit Pain Location: with R shoulder ROM Pain Descriptors / Indicators: Grimacing Pain Intervention(s): Monitored during session;Repositioned;Ice applied  Home Living                                          Prior Functioning/Environment              Frequency Min 3X/week     Progress Toward Goals  OT Goals(current goals can now be found in  the care plan section)  Progress towards OT goals: Progressing toward goals  Acute Rehab OT Goals Patient Stated Goal: not stated OT Goal Formulation: Patient unable to participate in goal setting Time For Goal Achievement: 05/07/14 Potential to Achieve Goals: Good ADL Goals Pt Will Perform Upper Body Bathing: with caregiver independent in assisting;with supervision;sitting Pt Will Perform Lower Body Bathing: with supervision;sit to/from stand;with caregiver independent in assisting Pt Will Perform Upper Body Dressing: with supervision;with caregiver independent in assisting;sitting Pt Will Perform Lower Body Dressing: with supervision;sit to/from stand;with caregiver independent in assisting Pt Will Transfer to Toilet: with supervision;ambulating;regular height toilet Pt Will Perform Toileting -  Clothing Manipulation and hygiene: with supervision;with caregiver independent in assisting;sit to/from stand Pt/caregiver will Perform Home Exercise Program: Right Upper extremity;With written HEP provided;With Supervision  Plan Discharge plan remains appropriate    Co-evaluation                 End of Session     Activity Tolerance Patient tolerated treatment well   Patient Left in bed;with call bell/phone within reach;with bed alarm set;with family/visitor present   Nurse Communication Mobility status;Other (comment) (recommend waist strap)        Time: [id number] OT Time Calculation (min): 18 min  Charges: OT General Charges $OT Visit: 1 Procedure OT Treatments $Therapeutic Activity: 8-22 mins  Othniel Maret,HILLARY 05/03/2014, 11:39 AM   Luisa DagoHilary Enrigue Hashimi, OTR/L  [phone] 05/03/2014

## 2014-05-03 NOTE — Discharge Instructions (Signed)
° °  Douglas ReaKevin M. Supple, M.D., F.A.A.O.S. Orthopaedic Surgery Specializing in Arthroscopic and Reconstructive Surgery of the Shoulder and Knee [phone] [address] [place], [place] [address] - Fax [phone]   POST-OP TOTAL SHOULDER REPLACEMENT/SHOULDER HEMIARTHROPLASTY INSTRUCTIONS  1. Call the office at [phone] to schedule your first post-op appointment 10-14 days from the date of your surgery.  2. The bandage over your incision is waterproof. You may begin showering with this dressing on. You may leave this dressing on until first follow up appointment within 2 weeks. If you would like to remove it you may do so after the 5th day. Go slow and tug at the borders gently to break the bond the dressing has with the skin. The steri strips may come off with the dressing. At this point if there is no drainage it is okay to go without a bandage or you may cover it with a light guaze and tape. Leave the steri-strips in place over your incision. You can expect drainage that is bloody or yellow in nature that should gradually decrease from day of surgery. Change your dressing daily until drainage is completely resolved, then you may feel free to go without a bandage. You can also expect significant bruising around your shoulder that will drift down your arm and into your chest wall. This is very normal and should resolve over several days.   3. Wear your sling/immobilizer at all times except to perform the exercises below or to occasionally let your arm dangle by your side to stretch your elbow. You also need to sleep in your sling immobilizer until instructed otherwise.  4. Range of motion to your elbow, wrist, and hand are encouraged 3-5 times daily. Exercise to your hand and fingers helps to reduce swelling you may experience.  5. Utilize ice to the shoulder 3-5 times minimum a day and additionally if you are experiencing pain.  6. Prescriptions for a pain medication and a muscle  relaxant are provided for you. It is recommended that if you are experiencing pain that you pain medication alone is not controlling, add the muscle relaxant along with the pain medication which can give additional pain relief. The first 1-2 days is generally the most severe of your pain and then should gradually decrease. As your pain lessens it is recommended that you decrease your use of the pain medications to an "as needed basis'" only and to always comply with the recommended dosages of the pain medications.  7. Pain medications can produce constipation along with their use. If you experience this, the use of an over the counter stool softener or laxative daily is recommended.   8. For most patients, if insurance allows, home health services to include therapy has been arranged.  9. For additional questions or concerns, please do not hesitate to call the office. If after hours there is an answering service to forward your concerns to the physician on call.  POST-OP EXERCISES  Pendulum Exercises  Perform pendulum exercises while standing and bending at the waist. Support your uninvolved arm on a table or chair and allow your operated arm to hang freely. Make sure to do these exercises passively - not using you shoulder muscles.  Repeat 20 times. Do 3 sessions per day.      Call Urology Monday AM at [phone] to be seen for voiding trial In 1-2 weeks. Per Dr. Berneice HeinrichManny   Minimize narcotics to avoid extreme exacerbation of current dementia.

## 2014-05-03 NOTE — Progress Notes (Signed)
Attempted to unwrap pressure gauze dressing on penis this morning however, it started to bleed again. Therefore, another gauze pressure dressing was reapplied. Urin output from 1930-0600 was 1000 ml with bloody tint and some clots. Will Continue to monitor.

## 2014-05-04 ENCOUNTER — Other Ambulatory Visit: Payer: Self-pay | Admitting: Internal Medicine

## 2014-05-04 DIAGNOSIS — R339 Retention of urine, unspecified: Secondary | ICD-10-CM | POA: Diagnosis not present

## 2014-05-04 DIAGNOSIS — M19011 Primary osteoarthritis, right shoulder: Secondary | ICD-10-CM | POA: Diagnosis not present

## 2014-05-04 DIAGNOSIS — J302 Other seasonal allergic rhinitis: Secondary | ICD-10-CM | POA: Diagnosis not present

## 2014-05-04 DIAGNOSIS — K219 Gastro-esophageal reflux disease without esophagitis: Secondary | ICD-10-CM | POA: Diagnosis not present

## 2014-05-04 MED ORDER — VALSARTAN 160 MG PO TABS: 160.0000 mg | ORAL_TABLET | Freq: Every day | ORAL | Status: DC

## 2014-05-07 ENCOUNTER — Encounter (HOSPITAL_COMMUNITY): Payer: Self-pay | Admitting: Emergency Medicine

## 2014-05-07 ENCOUNTER — Emergency Department (HOSPITAL_COMMUNITY)
Admission: EM | Admit: 2014-05-07 | Discharge: 2014-05-07 | Disposition: A | Discharge status: Home / Self Care | Payer: Commercial Managed Care - HMO | Attending: Emergency Medicine | Admitting: Emergency Medicine

## 2014-05-07 DIAGNOSIS — Z9861 Coronary angioplasty status: Secondary | ICD-10-CM | POA: Insufficient documentation

## 2014-05-07 DIAGNOSIS — K219 Gastro-esophageal reflux disease without esophagitis: Secondary | ICD-10-CM | POA: Diagnosis not present

## 2014-05-07 DIAGNOSIS — F418 Other specified anxiety disorders: Secondary | ICD-10-CM | POA: Insufficient documentation

## 2014-05-07 DIAGNOSIS — I2510 Atherosclerotic heart disease of native coronary artery without angina pectoris: Secondary | ICD-10-CM | POA: Insufficient documentation

## 2014-05-07 DIAGNOSIS — Z79899 Other long term (current) drug therapy: Secondary | ICD-10-CM | POA: Insufficient documentation

## 2014-05-07 DIAGNOSIS — Z8709 Personal history of other diseases of the respiratory system: Secondary | ICD-10-CM | POA: Insufficient documentation

## 2014-05-07 DIAGNOSIS — T839XXA Unspecified complication of genitourinary prosthetic device, implant and graft, initial encounter: Secondary | ICD-10-CM

## 2014-05-07 DIAGNOSIS — I252 Old myocardial infarction: Secondary | ICD-10-CM | POA: Insufficient documentation

## 2014-05-07 DIAGNOSIS — G2581 Restless legs syndrome: Secondary | ICD-10-CM | POA: Insufficient documentation

## 2014-05-07 DIAGNOSIS — K5900 Constipation, unspecified: Secondary | ICD-10-CM | POA: Diagnosis not present

## 2014-05-07 DIAGNOSIS — E785 Hyperlipidemia, unspecified: Secondary | ICD-10-CM | POA: Diagnosis not present

## 2014-05-07 DIAGNOSIS — M1990 Unspecified osteoarthritis, unspecified site: Secondary | ICD-10-CM | POA: Diagnosis not present

## 2014-05-07 DIAGNOSIS — I10 Essential (primary) hypertension: Secondary | ICD-10-CM | POA: Insufficient documentation

## 2014-05-07 DIAGNOSIS — N139 Obstructive and reflux uropathy, unspecified: Secondary | ICD-10-CM | POA: Diagnosis not present

## 2014-05-07 DIAGNOSIS — Y846 Urinary catheterization as the cause of abnormal reaction of the patient, or of later complication, without mention of misadventure at the time of the procedure: Secondary | ICD-10-CM | POA: Insufficient documentation

## 2014-05-07 DIAGNOSIS — T83098A Other mechanical complication of other indwelling urethral catheter, initial encounter: Secondary | ICD-10-CM | POA: Diagnosis not present

## 2014-05-07 DIAGNOSIS — Z9889 Other specified postprocedural states: Secondary | ICD-10-CM | POA: Insufficient documentation

## 2014-05-07 DIAGNOSIS — Z87891 Personal history of nicotine dependence: Secondary | ICD-10-CM | POA: Insufficient documentation

## 2014-05-07 DIAGNOSIS — F322 Major depressive disorder, single episode, severe without psychotic features: Secondary | ICD-10-CM | POA: Diagnosis not present

## 2014-05-07 DIAGNOSIS — Z7982 Long term (current) use of aspirin: Secondary | ICD-10-CM | POA: Insufficient documentation

## 2014-05-07 DIAGNOSIS — Z7951 Long term (current) use of inhaled steroids: Secondary | ICD-10-CM | POA: Insufficient documentation

## 2014-05-07 LAB — URINALYSIS, ROUTINE W REFLEX MICROSCOPIC
BILIRUBIN URINE: NEGATIVE
Glucose, UA: NEGATIVE mg/dL
KETONES UR: NEGATIVE mg/dL
Nitrite: NEGATIVE
PROTEIN: 30 mg/dL — AB
SPECIFIC GRAVITY, URINE: 1.02 (ref 1.005–1.030)
UROBILINOGEN UA: 1 mg/dL (ref 0.0–1.0)
pH: 7 (ref 5.0–8.0)

## 2014-05-07 LAB — URINE MICROSCOPIC-ADD ON

## 2014-05-07 MED ORDER — LIDOCAINE HCL 2 % EX GEL
CUTANEOUS | Status: AC
  Administered 2014-05-07: 18:00:00
  Filled 2014-05-07: qty 10

## 2014-05-07 NOTE — ED Notes (Addendum)
Pt from [facility] via EMS-Per EMS, pt foley not draining since last pm. EMS reports that there was blood in pts underwear. Pt has hx of pulling foley out. Pt is A&O and in NAD. Pt normally lives at home, at facility d/e recent shoulder sx

## 2014-05-07 NOTE — ED Notes (Signed)
[facility] paged for transport 

## 2014-05-07 NOTE — ED Provider Notes (Signed)
CSN: [id number]     Arrival date & time 05/07/14  1708 History   First MD Initiated Contact with Patient 05/07/14 1815     Chief Complaint  Patient presents with  . foley clogged      (Consider location/radiation/quality/duration/timing/severity/associated sxs/prior Treatment) HPI   [name] is a 81 y.o. male  who is here for evaluation of a draining Foley. Patient had a Foley catheter placed, after surgery last week and subsequently pulled it partially out. The foley balloon was manually deflated in the penile shaft via a trans-penile needle balloon puncture ("impressive case" per Dr. Unknown Foley) Catheter was replaced, by urology, 4 days ago with the plan to leave it in for 1-2 weeks. Patient is in rehabilitation currently. He has follow-up plans to contact urology in 3 days to arrange for Foley removal with voiding trial. There are no additional acute concernsccording to patient's wife, who gives the history.   Past Medical History  Diagnosis Date  . Heart attack   . Depression with anxiety     takes Clonazepam nightly  . RLS (restless legs syndrome)   . Unspecified vitamin D deficiency   . Vitamin B 12 deficiency   . Allergic rhinitis   . Coronary artery disease     s/p remote IWMI 2004 with PCI of the RCA with BMS and repeat cath 2007 due to recurrent CP and abnormal nuclear stress test and cath showed widely patent RCA stent with 30-50% distal stenosis before the takeoff of a PL and PDA and normal LVF.   [name] Hypertension     takes Diovan daily  . Constipation     takes Miralax daily as needed  . GERD (gastroesophageal reflux disease)     takes Omeprazole and Pepcid daily  . Hyperlipidemia     takes Atorvastatin daily  . History of bronchitis     pulmonologist is Dr.Wert.  . Arthritis   . Joint pain    Past Surgical History  Procedure Laterality Date  . Stents    . Nasal sinus surgery    . Hand surgery Bilateral   . Hernia repair Left     inguinal   . Cardiac  catheterization  2004  . Coronary angioplasty      1 stent  . Colonoscopy    . Esophagogastroduodenoscopy    . Cataract surgery    . Reverse shoulder arthroplasty Right 04/29/2014    Procedure: REVERSE TOTAL SHOULDER ARTHROPLASTY ;  Surgeon: Francena Hanly, MD;  Location: [facility] OR;  Service: Orthopedics;  Laterality: Right;   Family History  Problem Relation Age of Onset  . Cancer - Prostate Father   . Asthma Mother    History  Substance Use Topics  . Smoking status: Former Smoker -- 3.00 packs/day for 15 years    Types: Cigarettes    Quit date: 02/06/1964  . Smokeless tobacco: Not on file  . Alcohol Use: No    Review of Systems  All other systems reviewed and are negative.     Allergies  Review of patient's allergies indicates no known allergies.  Home Medications   Prior to Admission medications   Medication Sig Start Date End Date Taking? Authorizing Provider  aspirin 81 MG tablet Take 81 mg by mouth daily.   Yes Historical Provider, MD  atorvastatin (LIPITOR) 20 MG tablet Take 1 tablet (20 mg total) by mouth daily. 04/13/14  Yes Quintella Reichert, MD  bisacodyl (DULCOLAX) 5 MG EC tablet Take 1 tablet (5 mg  total) by mouth daily as needed for moderate constipation. 04/30/14  Yes Durene RomansMatthew Olin, MD  cetirizine (ZYRTEC) 10 MG tablet Take 10 mg by mouth daily.   Yes Historical Provider, MD  clonazePAM (KLONOPIN) 0.5 MG tablet Take 0.5 mg by mouth at bedtime.   Yes Historical Provider, MD  diazepam (VALIUM) 5 MG tablet Take 0.5-1 tablets (2.5-5 mg total) by mouth every 6 (six) hours as needed for muscle spasms or sedation. 05/01/14  Yes Tracy Shuford, PA-C  famotidine (PEPCID) 20 MG tablet Take 1 tablet (20 mg total) by mouth 2 (two) times daily. 04/21/14  Yes Nyoka CowdenMichael B Wert, MD  fluticasone (FLONASE) 50 MCG/ACT nasal spray Place 1 spray into both nostrils 2 (two) times daily.    Yes Historical Provider, MD  Methylfol-Methylcob-Acetylcyst (CEREFOLIN NAC) 6-2-600 MG TABS Take one tablet by  mouth one time daily 01/13/14  Yes Micki RileyPramod S Sethi, MD  oxyCODONE-acetaminophen (PERCOCET) 5-325 MG per tablet Take 1-2 tablets by mouth every 4 (four) hours as needed for severe pain (take for severe pain not controlled with tramadol). Patient taking differently: Take 1-2 tablets by mouth every 4 (four) hours as needed for moderate pain or severe pain (take for severe pain not controlled with tramadol). 1 tablet for moderate pain; 2 tablets for severe pain. 05/01/14  Yes Tracy Shuford, PA-C  polyethylene glycol (MIRALAX / GLYCOLAX) packet Take 17 g by mouth daily as needed for mild constipation. 04/30/14  Yes Durene RomansMatthew Olin, MD  tamsulosin (FLOMAX) 0.4 MG CAPS capsule Take 1 capsule (0.4 mg total) by mouth daily. 04/30/14  Yes Durene RomansMatthew Olin, MD  traMADol (ULTRAM) 50 MG tablet Take 1 tablet (50 mg total) by mouth every 6 (six) hours as needed (mild to moderate pain). 05/01/14  Yes Tracy Shuford, PA-C  valsartan (DIOVAN) 160 MG tablet Take 1 tablet (160 mg total) by mouth daily. 05/04/14  Yes Nyoka CowdenMichael B Wert, MD  cetirizine-pseudoephedrine (ZYRTEC-D) 5-120 MG per tablet Take 1 tablet by mouth 2 (two) times daily.    Historical Provider, MD  docusate sodium (COLACE) 100 MG capsule Take 1 capsule (100 mg total) by mouth 2 (two) times daily. 04/30/14   Durene RomansMatthew Olin, MD  finasteride (PROSCAR) 5 MG tablet Take 1 tablet (5 mg total) by mouth daily. Patient not taking: Reported on 05/07/2014 05/03/14   Ralene Batheracy Shuford, PA-C  omeprazole (PRILOSEC) 20 MG capsule Take 1 capsule (20 mg total) by mouth daily. Patient not taking: Reported on 05/07/2014 04/21/14   Nyoka CowdenMichael B Wert, MD  polyethylene glycol [facility](MIRALAX / Ethelene HalGLYCOLAX) packet Take 17 g by mouth daily as needed.    Historical Provider, MD   BP 112/62 mmHg  Pulse 99  Temp(Src) 98.6 F (37 C) (Oral)  Resp 16  SpO2 95% Physical Exam  Constitutional: He appears well-developed and well-nourished.  HENT:  Head: Normocephalic and atraumatic.  Right Ear: External ear normal.   Left Ear: External ear normal.  Eyes: Conjunctivae and EOM are normal. Pupils are equal, round, and reactive to light.  Neck: Normal range of motion and phonation normal. Neck supple.  Cardiovascular: Normal rate and normal heart sounds.   Pulmonary/Chest: Effort normal. He exhibits no bony tenderness.  Abdominal: Soft. There is no tenderness.  Genitourinary:  Foley placed through the urethra. Mild amount of blood at the urethral meatus. Normal-appearing penis and scrotum with scrotal contents, otherwise.  Musculoskeletal: Normal range of motion.  Neurological: He is alert. No cranial nerve deficit or sensory deficit. He exhibits normal muscle tone. Coordination normal.  Skin: Skin is warm, dry and intact.  Psychiatric: He has a normal mood and affect. His behavior is normal.  Nursing note and vitals reviewed.   ED Course  Procedures (including critical care time)  Bladder scan indicated greater than 500 cc of urine, in the bladder  Nursing attempted to irrigate the Foley, but the patient could not tolerate it.  Procedure- Foley catheter replacement, by me, with coude catheter. He was pretreated, with Xylocaine jelly. Coud catheter easily passed, revealing initial bloody and clear urine. Urine was sent for culture.  Findings discussed with patient's wife, all questions answered.  Labs Review Labs Reviewed  URINALYSIS, ROUTINE W REFLEX MICROSCOPIC - Abnormal; Notable for the following:    Color, Urine AMBER (*)    APPearance CLOUDY (*)    Hgb urine dipstick LARGE (*)    Protein, ur 30 (*)    Leukocytes, UA MODERATE (*)    All other components within normal limits  URINE MICROSCOPIC-ADD ON - Abnormal; Notable for the following:    Bacteria, UA MANY (*)    All other components within normal limits  URINE CULTURE    Imaging Review No results found.   EKG Interpretation None      MDM   Final diagnoses:  Foley catheter problem, initial encounter    Urinary  retention secondary to blood clot in foley. Recent urethral trauma, requiring ongoing catheter use.  Nursing Notes Reviewed/ Care Coordinated Applicable Imaging Reviewed Interpretation of Laboratory Data incorporated into ED treatment  The patient appears reasonably screened and/or stabilized for discharge and I doubt any other medical condition or other [facility] requiring further screening, evaluation, or treatment in the ED at this time prior to discharge.  Plan: Home Medications- usual; Home Treatments- rest; return here if the recommended treatment, does not improve the symptoms; Recommended follow up- Urology f/u 3 days for voiding trial if indicated     Mancel Bale, MD 05/08/14 1156

## 2014-05-07 NOTE — ED Notes (Signed)
Pt trying to stand up, redirected back to bed.

## 2014-05-09 ENCOUNTER — Emergency Department (HOSPITAL_COMMUNITY)
Admission: EM | Admit: 2014-05-09 | Discharge: 2014-05-09 | Disposition: A | Discharge status: Home / Self Care | Payer: Commercial Managed Care - HMO | Attending: Emergency Medicine | Admitting: Emergency Medicine

## 2014-05-09 ENCOUNTER — Encounter (HOSPITAL_COMMUNITY): Payer: Self-pay | Admitting: *Deleted

## 2014-05-09 DIAGNOSIS — E785 Hyperlipidemia, unspecified: Secondary | ICD-10-CM | POA: Insufficient documentation

## 2014-05-09 DIAGNOSIS — Z7982 Long term (current) use of aspirin: Secondary | ICD-10-CM | POA: Insufficient documentation

## 2014-05-09 DIAGNOSIS — I2510 Atherosclerotic heart disease of native coronary artery without angina pectoris: Secondary | ICD-10-CM | POA: Diagnosis not present

## 2014-05-09 DIAGNOSIS — K5900 Constipation, unspecified: Secondary | ICD-10-CM | POA: Diagnosis not present

## 2014-05-09 DIAGNOSIS — Z9889 Other specified postprocedural states: Secondary | ICD-10-CM | POA: Insufficient documentation

## 2014-05-09 DIAGNOSIS — Z9861 Coronary angioplasty status: Secondary | ICD-10-CM | POA: Insufficient documentation

## 2014-05-09 DIAGNOSIS — Z79899 Other long term (current) drug therapy: Secondary | ICD-10-CM | POA: Insufficient documentation

## 2014-05-09 DIAGNOSIS — Z8709 Personal history of other diseases of the respiratory system: Secondary | ICD-10-CM | POA: Insufficient documentation

## 2014-05-09 DIAGNOSIS — F418 Other specified anxiety disorders: Secondary | ICD-10-CM | POA: Insufficient documentation

## 2014-05-09 DIAGNOSIS — R310 Gross hematuria: Secondary | ICD-10-CM | POA: Insufficient documentation

## 2014-05-09 DIAGNOSIS — G2581 Restless legs syndrome: Secondary | ICD-10-CM | POA: Diagnosis not present

## 2014-05-09 DIAGNOSIS — T839XXA Unspecified complication of genitourinary prosthetic device, implant and graft, initial encounter: Secondary | ICD-10-CM

## 2014-05-09 DIAGNOSIS — Z7951 Long term (current) use of inhaled steroids: Secondary | ICD-10-CM | POA: Insufficient documentation

## 2014-05-09 DIAGNOSIS — T83098A Other mechanical complication of other indwelling urethral catheter, initial encounter: Secondary | ICD-10-CM | POA: Diagnosis not present

## 2014-05-09 DIAGNOSIS — I10 Essential (primary) hypertension: Secondary | ICD-10-CM | POA: Diagnosis not present

## 2014-05-09 DIAGNOSIS — Y846 Urinary catheterization as the cause of abnormal reaction of the patient, or of later complication, without mention of misadventure at the time of the procedure: Secondary | ICD-10-CM | POA: Insufficient documentation

## 2014-05-09 DIAGNOSIS — I252 Old myocardial infarction: Secondary | ICD-10-CM | POA: Diagnosis not present

## 2014-05-09 DIAGNOSIS — M1990 Unspecified osteoarthritis, unspecified site: Secondary | ICD-10-CM | POA: Insufficient documentation

## 2014-05-09 DIAGNOSIS — Z87891 Personal history of nicotine dependence: Secondary | ICD-10-CM | POA: Insufficient documentation

## 2014-05-09 DIAGNOSIS — K219 Gastro-esophageal reflux disease without esophagitis: Secondary | ICD-10-CM | POA: Diagnosis not present

## 2014-05-09 DIAGNOSIS — F0390 Unspecified dementia without behavioral disturbance: Secondary | ICD-10-CM | POA: Insufficient documentation

## 2014-05-09 LAB — I-STAT CHEM 8, ED
BUN: 11 mg/dL (ref 6–23)
CHLORIDE: 103 mmol/L (ref 96–112)
Calcium, Ion: 1.17 mmol/L (ref 1.13–1.30)
Creatinine, Ser: 1.1 mg/dL (ref 0.50–1.35)
Glucose, Bld: 121 mg/dL — ABNORMAL HIGH (ref 70–99)
HCT: 34 % — ABNORMAL LOW (ref 39.0–52.0)
Hemoglobin: 11.6 g/dL — ABNORMAL LOW (ref 13.0–17.0)
Potassium: 3.7 mmol/L (ref 3.5–5.1)
Sodium: 140 mmol/L (ref 135–145)
TCO2: 21 mmol/L (ref 0–100)

## 2014-05-09 LAB — CBC WITH DIFFERENTIAL/PLATELET
BASOS PCT: 0 % (ref 0–1)
Basophils Absolute: 0 10*3/uL (ref 0.0–0.1)
Eosinophils Absolute: 0.3 10*3/uL (ref 0.0–0.7)
Eosinophils Relative: 3 % (ref 0–5)
HCT: 32.7 % — ABNORMAL LOW (ref 39.0–52.0)
Hemoglobin: 10.8 g/dL — ABNORMAL LOW (ref 13.0–17.0)
LYMPHS ABS: 0.8 10*3/uL (ref 0.7–4.0)
LYMPHS PCT: 8 % — AB (ref 12–46)
MCH: 31.7 pg (ref 26.0–34.0)
MCHC: 33 g/dL (ref 30.0–36.0)
MCV: 95.9 fL (ref 78.0–100.0)
Monocytes Absolute: 0.9 10*3/uL (ref 0.1–1.0)
Monocytes Relative: 8 % (ref 3–12)
NEUTROS ABS: 8.8 10*3/uL — AB (ref 1.7–7.7)
NEUTROS PCT: 81 % — AB (ref 43–77)
Platelets: 238 10*3/uL (ref 150–400)
RBC: 3.41 MIL/uL — AB (ref 4.22–5.81)
RDW: 12.9 % (ref 11.5–15.5)
WBC: 10.8 10*3/uL — ABNORMAL HIGH (ref 4.0–10.5)

## 2014-05-09 LAB — URINALYSIS, ROUTINE W REFLEX MICROSCOPIC
Bilirubin Urine: NEGATIVE
Glucose, UA: NEGATIVE mg/dL
KETONES UR: NEGATIVE mg/dL
Nitrite: NEGATIVE
PH: 6 (ref 5.0–8.0)
Protein, ur: 100 mg/dL — AB
Specific Gravity, Urine: 1.006 (ref 1.005–1.030)
Urobilinogen, UA: 0.2 mg/dL (ref 0.0–1.0)

## 2014-05-09 LAB — URINE MICROSCOPIC-ADD ON

## 2014-05-09 MED ORDER — BELLADONNA ALKALOIDS-OPIUM 16.2-60 MG RE SUPP
1.0000 | Freq: Once | RECTAL | Status: AC
  Administered 2014-05-09: 1 via RECTAL
  Filled 2014-05-09: qty 1

## 2014-05-09 MED ORDER — LORAZEPAM 2 MG/ML IJ SOLN
1.0000 mg | Freq: Once | INTRAMUSCULAR | Status: AC
  Administered 2014-05-09: 1 mg via INTRAVENOUS
  Filled 2014-05-09: qty 1

## 2014-05-09 MED ORDER — MORPHINE SULFATE 4 MG/ML IJ SOLN
4.0000 mg | Freq: Once | INTRAMUSCULAR | Status: AC
  Administered 2014-05-09: 4 mg via INTRAVENOUS
  Filled 2014-05-09: qty 1

## 2014-05-09 MED ORDER — SODIUM CHLORIDE 0.9 % IV BOLUS (SEPSIS)
500.0000 mL | Freq: Once | INTRAVENOUS | Status: AC
  Administered 2014-05-09: 500 mL via INTRAVENOUS

## 2014-05-09 NOTE — ED Notes (Signed)
Pt continues to pull at new foley catheter. Pt appears to be a&ox3 at this time. Episodes if possible delirium witnessed.

## 2014-05-09 NOTE — Discharge Instructions (Signed)
Follow up with your urologist tomorrow for further management of your foley.  Avoid pulling on foley catheter.

## 2014-05-09 NOTE — ED Notes (Signed)
Pt is very confused. Pt is insisting to use the restroom, explained to pt that he has a cath. Pt didn't want to get back in bed. Ambulated pt to restroom to try and further explain that he is unable to void in the commode. Pt stated he felt constipated. Ambulated pt to room. Pt sat on bed and complained of his buttocks hurting. Notified Art therapistAmber RN.

## 2014-05-09 NOTE — ED Notes (Signed)
Pt pulling on foley

## 2014-05-09 NOTE — ED Notes (Signed)
[name] [phone]

## 2014-05-09 NOTE — ED Notes (Signed)
Bed: [id number] Expected date: 05/09/14 Expected time: 8:04 AM Means of arrival: Ambulance Comments: Dislodged foley

## 2014-05-09 NOTE — ED Provider Notes (Signed)
CSN: [id number]     Arrival date & time 05/09/14  0806 History   First MD Initiated Contact with Patient 05/09/14 [phone]     Chief Complaint  Patient presents with  . pulled out foley      (Consider location/radiation/quality/duration/timing/severity/associated sxs/prior Treatment) HPI   81 year old male who had a shoulder surgery 2 weeks ago had a temporary Foley cath in place presented for evaluation of inavertedly removal of foley cath.  [facility]  Staff noticed moderate amount of blood in the Foley bag this morning without Foley fully out of place. It was noted that he had good urine production prior to this morning.  Pt is a poor historian.  He does not know what has happened.  He denies pulling on foley catheter.  Denies having abdominal pain, fever, vomiting.  He was seen in ER 2 days ago for the same complaint.  It was noted that the foley was partially out with inflating balloon in penile shaft which was manually deflated via a trans-penile needle balloon puncture by urology and foley was replaced.  Plan was to leave foley in for 1-2 weeks.    Past Medical History  Diagnosis Date  . Heart attack   . Depression with anxiety     takes Clonazepam nightly  . RLS (restless legs syndrome)   . Unspecified vitamin D deficiency   . Vitamin B 12 deficiency   . Allergic rhinitis   . Coronary artery disease     s/p remote IWMI 2004 with PCI of the RCA with BMS and repeat cath 2007 due to recurrent CP and abnormal nuclear stress test and cath showed widely patent RCA stent with 30-50% distal stenosis before the takeoff of a PL and PDA and normal LVF.   [name]. Hypertension     takes Diovan daily  . Constipation     takes Miralax daily as needed  . GERD (gastroesophageal reflux disease)     takes Omeprazole and Pepcid daily  . Hyperlipidemia     takes Atorvastatin daily  . History of bronchitis     pulmonologist is Dr.Wert.  . Arthritis   . Joint pain    Past Surgical  History  Procedure Laterality Date  . Stents    . Nasal sinus surgery    . Hand surgery Bilateral   . Hernia repair Left     inguinal   . Cardiac catheterization  2004  . Coronary angioplasty      1 stent  . Colonoscopy    . Esophagogastroduodenoscopy    . Cataract surgery    . Reverse shoulder arthroplasty Right 04/29/2014    Procedure: REVERSE TOTAL SHOULDER ARTHROPLASTY ;  Surgeon: Francena HanlyKevin Supple, MD;  Location: [facility] OR;  Service: Orthopedics;  Laterality: Right;   Family History  Problem Relation Age of Onset  . Cancer - Prostate Father   . Asthma Mother    History  Substance Use Topics  . Smoking status: Former Smoker -- 3.00 packs/day for 15 years    Types: Cigarettes    Quit date: 02/06/1964  . Smokeless tobacco: Not on file  . Alcohol Use: No    Review of Systems  Unable to perform ROS: Dementia      Allergies  Review of patient's allergies indicates no known allergies.  Home Medications   Prior to Admission medications   Medication Sig Start Date End Date Taking? Authorizing Provider  aspirin 81 MG tablet Take 81 mg by mouth daily.  Historical Provider, MD  atorvastatin (LIPITOR) 20 MG tablet Take 1 tablet (20 mg total) by mouth daily. 04/13/14   Quintella Reichert, MD  bisacodyl (DULCOLAX) 5 MG EC tablet Take 1 tablet (5 mg total) by mouth daily as needed for moderate constipation. 04/30/14   Durene Romans, MD  cetirizine (ZYRTEC) 10 MG tablet Take 10 mg by mouth daily.    Historical Provider, MD  cetirizine-pseudoephedrine (ZYRTEC-D) 5-120 MG per tablet Take 1 tablet by mouth 2 (two) times daily.    Historical Provider, MD  clonazePAM (KLONOPIN) 0.5 MG tablet Take 0.5 mg by mouth at bedtime.    Historical Provider, MD  diazepam (VALIUM) 5 MG tablet Take 0.5-1 tablets (2.5-5 mg total) by mouth every 6 (six) hours as needed for muscle spasms or sedation. 05/01/14   [name] Shuford, PA-C  docusate sodium (COLACE) 100 MG capsule Take 1 capsule (100 mg total) by mouth 2  (two) times daily. 04/30/14   Durene Romans, MD  famotidine (PEPCID) 20 MG tablet Take 1 tablet (20 mg total) by mouth 2 (two) times daily. 04/21/14   Nyoka Cowden, MD  finasteride (PROSCAR) 5 MG tablet Take 1 tablet (5 mg total) by mouth daily. Patient not taking: Reported on 05/07/2014 05/03/14   Ralene Bathe, PA-C  fluticasone [facility]) 50 MCG/ACT nasal spray Place 1 spray into both nostrils 2 (two) times daily.     Historical Provider, MD  Methylfol-Methylcob-Acetylcyst (CEREFOLIN NAC) 6-2-600 MG TABS Take one tablet by mouth one time daily 01/13/14   Micki Riley, MD  omeprazole (PRILOSEC) 20 MG capsule Take 1 capsule (20 mg total) by mouth daily. Patient not taking: Reported on 05/07/2014 04/21/14   Nyoka Cowden, MD  oxyCODONE-acetaminophen (PERCOCET) 5-325 MG per tablet Take 1-2 tablets by mouth every 4 (four) hours as needed for severe pain (take for severe pain not controlled with tramadol). Patient taking differently: Take 1-2 tablets by mouth every 4 (four) hours as needed for moderate pain or severe pain (take for severe pain not controlled with tramadol). 1 tablet for moderate pain; 2 tablets for severe pain. 05/01/14   [name] Shuford, PA-C  polyethylene glycol (MIRALAX / GLYCOLAX) packet Take 17 g by mouth daily as needed.    Historical Provider, MD  polyethylene glycol (MIRALAX / GLYCOLAX) packet Take 17 g by mouth daily as needed for mild constipation. 04/30/14   Durene Romans, MD  tamsulosin (FLOMAX) 0.4 MG CAPS capsule Take 1 capsule (0.4 mg total) by mouth daily. 04/30/14   Durene Romans, MD  traMADol (ULTRAM) 50 MG tablet Take 1 tablet (50 mg total) by mouth every 6 (six) hours as needed (mild to moderate pain). 05/01/14   [name] Shuford, PA-C  valsartan (DIOVAN) 160 MG tablet Take 1 tablet (160 mg total) by mouth daily. 05/04/14   Nyoka Cowden, MD   BP 128/88 mmHg  Pulse 85  Temp(Src) 98.3 F (36.8 C) (Oral)  Resp 18  SpO2 99% Physical Exam  Constitutional: He appears well-developed  and well-nourished. No distress.  HENT:  Head: Atraumatic.  Eyes: Conjunctivae are normal.  Neck: Normal range of motion. Neck supple.  Abdominal: Soft. There is tenderness (mild suprapubic tenderness, non distention.  ).  Genitourinary:  Chaperone present: foley in place, blood noted at urethral meatus.  Blood noted in foley cath.  Mild tenderness along penile shaft without evidence of catheter balloon in shaft.    Musculoskeletal:  R sling in place  Neurological: He is alert.  Pleasant but moderately  demented  Skin: No rash noted.  Psychiatric: He has a normal mood and affect.    ED Course  Procedures (including critical care time)  9:07 AM Pt has been pulling on his foley catheter causing hematuria from bladder trauma.  I have attempted to irrigate foley without urine return.  Suspect clot burden obstructing catheter.  Will replace with a 3 way 20 F coude foley and perform bladder irrigation.    11:29 AM Patient with baseline dementia poorly on his Foley causing Foley trauma. Inability to adequately irrigate Foley in the ER, suspect patient may need admission for more thorough management. [facility] urologist. Discussed with Dr. Juleen China  12:33 PM Pt pacing about the room, manipulating the foley catheter and reservoir.  I direct pt back to bed, will give a small dose of ativan.   2:17 PM Bladder succesfully irrigated, with establishing of urine output.  Pt will be returning back to SNF with hand mitten to prevent from pulling on his foley.  Pt to f/u with urologist tomorrow for further management.  Dr. Juleen China is aware and have assessed pt.   Labs Review Labs Reviewed - No data to display  Imaging Review No results found.   EKG Interpretation None      MDM   Final diagnoses:  Gross hematuria  Complication of Foley catheter, initial encounter    BP 136/85 mmHg  Pulse 105  Temp(Src) 98.3 F (36.8 C) (Oral)  Resp 18  SpO2 96%     Fayrene Helper, PA-C 05/09/14  1418  Raeford Razor, MD 05/11/14 [phone]

## 2014-05-09 NOTE — ED Notes (Signed)
Bladder scanner read 81 mL. RN made aware.

## 2014-05-09 NOTE — ED Notes (Signed)
360 cc sterile water used to irrigate. Some clots were expelled however sterile water was not drained. Pt bladder scan shows 58 cc

## 2014-05-09 NOTE — ED Notes (Signed)
Pt was pulling on foley

## 2014-05-09 NOTE — ED Notes (Signed)
Pt from [facility] with c/o pulled out foley cath.this morning.

## 2014-05-09 NOTE — ED Notes (Addendum)
[facility] contacted to make aware of pt dc, [facility] contacted, report given to Dor[facility] nurse. Questions concerns denied. [facility] contacted

## 2014-05-10 DIAGNOSIS — M19011 Primary osteoarthritis, right shoulder: Secondary | ICD-10-CM | POA: Diagnosis not present

## 2014-05-10 DIAGNOSIS — R451 Restlessness and agitation: Secondary | ICD-10-CM | POA: Diagnosis not present

## 2014-05-10 DIAGNOSIS — R319 Hematuria, unspecified: Secondary | ICD-10-CM | POA: Diagnosis not present

## 2014-05-10 DIAGNOSIS — F0391 Unspecified dementia with behavioral disturbance: Secondary | ICD-10-CM | POA: Diagnosis not present

## 2014-05-10 LAB — URINE CULTURE
Colony Count: NO GROWTH
Culture: NO GROWTH

## 2014-05-11 ENCOUNTER — Telehealth (HOSPITAL_BASED_OUTPATIENT_CLINIC_OR_DEPARTMENT_OTHER): Payer: Self-pay | Admitting: Emergency Medicine

## 2014-05-11 DIAGNOSIS — N390 Urinary tract infection, site not specified: Secondary | ICD-10-CM | POA: Diagnosis not present

## 2014-05-11 DIAGNOSIS — R509 Fever, unspecified: Secondary | ICD-10-CM | POA: Diagnosis not present

## 2014-05-11 DIAGNOSIS — R339 Retention of urine, unspecified: Secondary | ICD-10-CM | POA: Diagnosis not present

## 2014-05-11 DIAGNOSIS — R319 Hematuria, unspecified: Secondary | ICD-10-CM | POA: Diagnosis not present

## 2014-05-11 LAB — URINE CULTURE
Colony Count: >=100000
Special Requests: NORMAL

## 2014-05-11 NOTE — Telephone Encounter (Signed)
Post ED Visit - Positive Culture Follow-up  Culture report reviewed by antimicrobial stewardship pharmacist: []  Wes Dulaney, Pharm.D., BCPS [x]  Celedonio MiyamotoJeremy Frens, [address] BCPS []  Georgina PillionElizabeth Martin, Pharm.D., BCPS []  [place]am, [address] BCPS, AAHIVP []  Estella HuskMichelle Barella, Pharm.D., BCPS, AAHIVP []  Elder CyphersLorie Poole, [address] BCPS  Positive urine culture Pseudomonas Treated with none, asymptomatic, no futher followup required at this time.  Berle MullMiller, Reid Nawrot 05/11/2014, 12:01 PM

## 2014-05-12 ENCOUNTER — Telehealth (HOSPITAL_BASED_OUTPATIENT_CLINIC_OR_DEPARTMENT_OTHER): Payer: Self-pay | Admitting: Emergency Medicine

## 2014-05-12 DIAGNOSIS — M19011 Primary osteoarthritis, right shoulder: Secondary | ICD-10-CM | POA: Diagnosis not present

## 2014-05-12 DIAGNOSIS — N179 Acute kidney failure, unspecified: Secondary | ICD-10-CM | POA: Diagnosis not present

## 2014-05-12 DIAGNOSIS — F0391 Unspecified dementia with behavioral disturbance: Secondary | ICD-10-CM | POA: Diagnosis not present

## 2014-05-12 DIAGNOSIS — N390 Urinary tract infection, site not specified: Secondary | ICD-10-CM | POA: Diagnosis not present

## 2014-05-12 NOTE — Telephone Encounter (Signed)
Post ED Visit - Positive Culture Follow-up  Culture report reviewed by antimicrobial stewardship pharmacist: []  Wes Dulaney, Pharm.D., BCPS []  Celedonio MiyamotoJeremy Frens, Pharm.D., BCPS []  Georgina PillionElizabeth Martin, Pharm.D., BCPS []  [place], [address] BCPS, AAHIVP []  Estella HuskMichelle Zane, Pharm.D., BCPS, AAHIVP [x]  Russ HaloAshley McCallister, [address] BCPS  Positive Urine culture Per Ladona MowJoe MIntz PA, no further patient follow-up is required at this time.  Jiles HaroldGammons, Donnika Kucher Chaney 05/12/2014, 5:48 PM

## 2014-05-13 DIAGNOSIS — Z471 Aftercare following joint replacement surgery: Secondary | ICD-10-CM | POA: Diagnosis not present

## 2014-05-13 DIAGNOSIS — Z96611 Presence of right artificial shoulder joint: Secondary | ICD-10-CM | POA: Diagnosis not present

## 2014-05-14 ENCOUNTER — Other Ambulatory Visit: Payer: Self-pay | Admitting: Internal Medicine

## 2014-05-14 DIAGNOSIS — I712 Thoracic aortic aneurysm, without rupture, unspecified: Secondary | ICD-10-CM

## 2014-05-14 DIAGNOSIS — N401 Enlarged prostate with lower urinary tract symptoms: Secondary | ICD-10-CM | POA: Diagnosis not present

## 2014-05-14 DIAGNOSIS — N138 Other obstructive and reflux uropathy: Secondary | ICD-10-CM | POA: Diagnosis not present

## 2014-05-18 DIAGNOSIS — N401 Enlarged prostate with lower urinary tract symptoms: Secondary | ICD-10-CM | POA: Diagnosis not present

## 2014-05-18 DIAGNOSIS — R339 Retention of urine, unspecified: Secondary | ICD-10-CM | POA: Diagnosis not present

## 2014-05-18 DIAGNOSIS — N138 Other obstructive and reflux uropathy: Secondary | ICD-10-CM | POA: Diagnosis not present

## 2014-05-19 DIAGNOSIS — D649 Anemia, unspecified: Secondary | ICD-10-CM | POA: Diagnosis not present

## 2014-05-19 DIAGNOSIS — R451 Restlessness and agitation: Secondary | ICD-10-CM | POA: Diagnosis not present

## 2014-05-19 DIAGNOSIS — N390 Urinary tract infection, site not specified: Secondary | ICD-10-CM | POA: Diagnosis not present

## 2014-05-19 DIAGNOSIS — R339 Retention of urine, unspecified: Secondary | ICD-10-CM | POA: Diagnosis not present

## 2014-05-20 DIAGNOSIS — M19011 Primary osteoarthritis, right shoulder: Secondary | ICD-10-CM | POA: Diagnosis not present

## 2014-05-20 DIAGNOSIS — R451 Restlessness and agitation: Secondary | ICD-10-CM | POA: Diagnosis not present

## 2014-05-20 DIAGNOSIS — N138 Other obstructive and reflux uropathy: Secondary | ICD-10-CM | POA: Diagnosis not present

## 2014-05-23 ENCOUNTER — Emergency Department (HOSPITAL_COMMUNITY)
Admission: EM | Admit: 2014-05-23 | Discharge: 2014-05-23 | Disposition: A | Discharge status: Home / Self Care | Payer: Commercial Managed Care - HMO | Attending: Emergency Medicine | Admitting: Emergency Medicine

## 2014-05-23 ENCOUNTER — Emergency Department (HOSPITAL_COMMUNITY): Payer: Commercial Managed Care - HMO

## 2014-05-23 ENCOUNTER — Encounter (HOSPITAL_COMMUNITY): Payer: Self-pay | Admitting: *Deleted

## 2014-05-23 DIAGNOSIS — Y998 Other external cause status: Secondary | ICD-10-CM | POA: Insufficient documentation

## 2014-05-23 DIAGNOSIS — Y9389 Activity, other specified: Secondary | ICD-10-CM | POA: Insufficient documentation

## 2014-05-23 DIAGNOSIS — E785 Hyperlipidemia, unspecified: Secondary | ICD-10-CM | POA: Diagnosis not present

## 2014-05-23 DIAGNOSIS — G2581 Restless legs syndrome: Secondary | ICD-10-CM | POA: Insufficient documentation

## 2014-05-23 DIAGNOSIS — S199XXA Unspecified injury of neck, initial encounter: Secondary | ICD-10-CM | POA: Diagnosis not present

## 2014-05-23 DIAGNOSIS — K5900 Constipation, unspecified: Secondary | ICD-10-CM | POA: Insufficient documentation

## 2014-05-23 DIAGNOSIS — F0390 Unspecified dementia without behavioral disturbance: Secondary | ICD-10-CM | POA: Insufficient documentation

## 2014-05-23 DIAGNOSIS — I2510 Atherosclerotic heart disease of native coronary artery without angina pectoris: Secondary | ICD-10-CM | POA: Insufficient documentation

## 2014-05-23 DIAGNOSIS — M79661 Pain in right lower leg: Secondary | ICD-10-CM | POA: Diagnosis not present

## 2014-05-23 DIAGNOSIS — S0531XA Ocular laceration without prolapse or loss of intraocular tissue, right eye, initial encounter: Secondary | ICD-10-CM | POA: Diagnosis not present

## 2014-05-23 DIAGNOSIS — I252 Old myocardial infarction: Secondary | ICD-10-CM | POA: Insufficient documentation

## 2014-05-23 DIAGNOSIS — R2241 Localized swelling, mass and lump, right lower limb: Secondary | ICD-10-CM | POA: Diagnosis not present

## 2014-05-23 DIAGNOSIS — I10 Essential (primary) hypertension: Secondary | ICD-10-CM | POA: Insufficient documentation

## 2014-05-23 DIAGNOSIS — Z9861 Coronary angioplasty status: Secondary | ICD-10-CM | POA: Insufficient documentation

## 2014-05-23 DIAGNOSIS — M79671 Pain in right foot: Secondary | ICD-10-CM | POA: Diagnosis not present

## 2014-05-23 DIAGNOSIS — S8991XA Unspecified injury of right lower leg, initial encounter: Secondary | ICD-10-CM | POA: Diagnosis not present

## 2014-05-23 DIAGNOSIS — M7989 Other specified soft tissue disorders: Secondary | ICD-10-CM

## 2014-05-23 DIAGNOSIS — M1990 Unspecified osteoarthritis, unspecified site: Secondary | ICD-10-CM | POA: Insufficient documentation

## 2014-05-23 DIAGNOSIS — Z79899 Other long term (current) drug therapy: Secondary | ICD-10-CM | POA: Insufficient documentation

## 2014-05-23 DIAGNOSIS — W19XXXA Unspecified fall, initial encounter: Secondary | ICD-10-CM

## 2014-05-23 DIAGNOSIS — Y846 Urinary catheterization as the cause of abnormal reaction of the patient, or of later complication, without mention of misadventure at the time of the procedure: Secondary | ICD-10-CM | POA: Insufficient documentation

## 2014-05-23 DIAGNOSIS — T83098A Other mechanical complication of other indwelling urethral catheter, initial encounter: Secondary | ICD-10-CM | POA: Diagnosis not present

## 2014-05-23 DIAGNOSIS — T839XXA Unspecified complication of genitourinary prosthetic device, implant and graft, initial encounter: Secondary | ICD-10-CM | POA: Diagnosis not present

## 2014-05-23 DIAGNOSIS — Z7951 Long term (current) use of inhaled steroids: Secondary | ICD-10-CM | POA: Insufficient documentation

## 2014-05-23 DIAGNOSIS — S99921A Unspecified injury of right foot, initial encounter: Secondary | ICD-10-CM | POA: Diagnosis not present

## 2014-05-23 DIAGNOSIS — K219 Gastro-esophageal reflux disease without esophagitis: Secondary | ICD-10-CM | POA: Insufficient documentation

## 2014-05-23 DIAGNOSIS — S299XXA Unspecified injury of thorax, initial encounter: Secondary | ICD-10-CM | POA: Diagnosis not present

## 2014-05-23 DIAGNOSIS — Z87891 Personal history of nicotine dependence: Secondary | ICD-10-CM | POA: Insufficient documentation

## 2014-05-23 DIAGNOSIS — Y9289 Other specified places as the place of occurrence of the external cause: Secondary | ICD-10-CM | POA: Insufficient documentation

## 2014-05-23 DIAGNOSIS — Z7982 Long term (current) use of aspirin: Secondary | ICD-10-CM | POA: Insufficient documentation

## 2014-05-23 LAB — BASIC METABOLIC PANEL
Anion gap: 9 (ref 5–15)
BUN: 9 mg/dL (ref 6–23)
CO2: 28 mmol/L (ref 19–32)
Calcium: 9 mg/dL (ref 8.4–10.5)
Chloride: 101 mmol/L (ref 96–112)
Creatinine, Ser: 1.53 mg/dL — ABNORMAL HIGH (ref 0.50–1.35)
GFR, EST AFRICAN AMERICAN: 48 mL/min — AB (ref >90)
GFR, EST NON AFRICAN AMERICAN: 41 mL/min — AB (ref >90)
GLUCOSE: 123 mg/dL — AB (ref 70–99)
Potassium: 3.8 mmol/L (ref 3.5–5.1)
Sodium: 138 mmol/L (ref 135–145)

## 2014-05-23 LAB — URINALYSIS, ROUTINE W REFLEX MICROSCOPIC
Glucose, UA: NEGATIVE mg/dL
Ketones, ur: 15 mg/dL — AB
NITRITE: POSITIVE — AB
PROTEIN: 30 mg/dL — AB
Specific Gravity, Urine: 1.013 (ref 1.005–1.030)
Urobilinogen, UA: 1 mg/dL (ref 0.0–1.0)
pH: 7 (ref 5.0–8.0)

## 2014-05-23 LAB — CBC
HCT: 34.2 % — ABNORMAL LOW (ref 39.0–52.0)
Hemoglobin: 10.7 g/dL — ABNORMAL LOW (ref 13.0–17.0)
MCH: 30.6 pg (ref 26.0–34.0)
MCHC: 31.3 g/dL (ref 30.0–36.0)
MCV: 97.7 fL (ref 78.0–100.0)
PLATELETS: 259 10*3/uL (ref 150–400)
RBC: 3.5 MIL/uL — AB (ref 4.22–5.81)
RDW: 15.2 % (ref 11.5–15.5)
WBC: 9.4 10*3/uL (ref 4.0–10.5)

## 2014-05-23 LAB — URINE MICROSCOPIC-ADD ON

## 2014-05-23 LAB — I-STAT TROPONIN, ED: Troponin i, poc: 0 ng/mL (ref 0.00–0.08)

## 2014-05-23 MED ORDER — LIDOCAINE HCL 2 % EX GEL
1.0000 "application " | Freq: Once | CUTANEOUS | Status: AC
  Administered 2014-05-23: 1 via TOPICAL
  Filled 2014-05-23: qty 20

## 2014-05-23 NOTE — ED Notes (Signed)
Patient transported to CT 

## 2014-05-23 NOTE — Progress Notes (Signed)
VASCULAR LAB PRELIMINARY  PRELIMINARY  PRELIMINARY  PRELIMINARY  Right lower extremity venous Doppler completed.    Preliminary report:  There is no obvious evidence of DVT or SVT noted in the right lower extremity.  Irvan Tiedt, RVT 05/23/2014, 3:43 PM

## 2014-05-23 NOTE — ED Notes (Addendum)
Lab called RE urine.  They stated results would be available in 5 min.

## 2014-05-23 NOTE — ED Notes (Signed)
Balloon inflated near opening of urethra.  Bright red blood noted with removal of coude catheter - Dr Gwendolyn GrantWalden at bedside. 10 ml ns removed to deflate balloon. Urology paged.

## 2014-05-23 NOTE — ED Notes (Signed)
Pt attempting to pull out catheter.  Placed in diaper and given lunch.  Vascular at bedside.

## 2014-05-23 NOTE — ED Notes (Signed)
Pt wife notes swelling to R leg since surgery.  X-rays ordered.

## 2014-05-23 NOTE — ED Provider Notes (Signed)
CSN: [id number]     Arrival date & time 05/23/14  1027 History   First MD Initiated Contact with Patient 05/23/14 1029     Chief Complaint  Patient presents with  . pulled out foley      (Consider location/radiation/quality/duration/timing/severity/associated sxs/prior Treatment) HPI Comments: Larey Seat yesterday, hit his head on the floor. Increased confusion since. Pulled out his foley catheter almost the entire way. Had Foley changed 2 weeks ago after yanking it out. Required trans-urethral needle puncture to manually deflate the balloon.  Patient is a 81 y.o. male presenting with male genitourinary complaint and fall. The history is provided by the EMS personnel.  Male GU Problem Presenting symptoms comment:  Yanked out Foley Context: spontaneously   Relieved by:  Nothing Worsened by:  Nothing tried Associated symptoms: no abdominal pain and no vomiting   Risk factors: urinary catheter   Fall This is a new problem. The current episode started yesterday. The problem occurs constantly. The problem has not changed since onset.Pertinent negatives include no chest pain and no abdominal pain. Nothing aggravates the symptoms. Nothing relieves the symptoms.    Past Medical History  Diagnosis Date  . Heart attack   . Depression with anxiety     takes Clonazepam nightly  . RLS (restless legs syndrome)   . Unspecified vitamin D deficiency   . Vitamin B 12 deficiency   . Allergic rhinitis   . Coronary artery disease     s/p remote IWMI 2004 with PCI of the RCA with BMS and repeat cath 2007 due to recurrent CP and abnormal nuclear stress test and cath showed widely patent RCA stent with 30-50% distal stenosis before the takeoff of a PL and PDA and normal LVF.   [name] Hypertension     takes Diovan daily  . Constipation     takes Miralax daily as needed  . GERD (gastroesophageal reflux disease)     takes Omeprazole and Pepcid daily  . Hyperlipidemia     takes Atorvastatin daily  . History of  bronchitis     pulmonologist is Dr.Wert.  . Arthritis   . Joint pain    Past Surgical History  Procedure Laterality Date  . Stents    . Nasal sinus surgery    . Hand surgery Bilateral   . Hernia repair Left     inguinal   . Cardiac catheterization  2004  . Coronary angioplasty      1 stent  . Colonoscopy    . Esophagogastroduodenoscopy    . Cataract surgery    . Reverse shoulder arthroplasty Right 04/29/2014    Procedure: REVERSE TOTAL SHOULDER ARTHROPLASTY ;  Surgeon: Francena Hanly, MD;  Location: [facility] OR;  Service: Orthopedics;  Laterality: Right;   Family History  Problem Relation Age of Onset  . Cancer - Prostate Father   . Asthma Mother    History  Substance Use Topics  . Smoking status: Former Smoker -- 3.00 packs/day for 15 years    Types: Cigarettes    Quit date: 02/06/1964  . Smokeless tobacco: Not on file  . Alcohol Use: No    Review of Systems  Unable to perform ROS: Dementia  Cardiovascular: Negative for chest pain.  Gastrointestinal: Negative for vomiting and abdominal pain.      Allergies  Review of patient's allergies indicates no known allergies.  Home Medications   Prior to Admission medications   Medication Sig Start Date End Date Taking? Authorizing Provider  aspirin 81 MG tablet Take  81 mg by mouth daily.    Historical Provider, MD  atorvastatin (LIPITOR) 20 MG tablet Take 1 tablet (20 mg total) by mouth daily. 04/13/14   Quintella Reichertraci R Epp, MD  bisacodyl (DULCOLAX) 5 MG EC tablet Take 1 tablet (5 mg total) by mouth daily as needed for moderate constipation. 04/30/14   Durene RomansMatthew Olin, MD  cetirizine (ZYRTEC) 10 MG tablet Take 10 mg by mouth daily.    Historical Provider, MD  cetirizine-pseudoephedrine (ZYRTEC-D) 5-120 MG per tablet Take 1 tablet by mouth 2 (two) times daily.    Historical Provider, MD  clonazePAM (KLONOPIN) 0.5 MG tablet Take 0.5 mg by mouth at bedtime.    Historical Provider, MD  diazepam (VALIUM) 5 MG tablet Take 0.5-1 tablets (2.5-5  mg total) by mouth every 6 (six) hours as needed for muscle spasms or sedation. 05/01/14   [name] Shuford, PA-C  docusate sodium (COLACE) 100 MG capsule Take 1 capsule (100 mg total) by mouth 2 (two) times daily. 04/30/14   Durene RomansMatthew Olin, MD  famotidine (PEPCID) 20 MG tablet Take 1 tablet (20 mg total) by mouth 2 (two) times daily. 04/21/14   Nyoka CowdenMichael B Wert, MD  finasteride (PROSCAR) 5 MG tablet Take 1 tablet (5 mg total) by mouth daily. 05/03/14   [name] Shuford, PA-C  fluticasone (FLONASE) 50 MCG/ACT nasal spray Place 1 spray into both nostrils 2 (two) times daily.     Historical Provider, MD  Methylfol-Methylcob-Acetylcyst (CEREFOLIN NAC) 6-2-600 MG TABS Take one tablet by mouth one time daily 01/13/14   Micki RileyPramod S Sethi, MD  omeprazole (PRILOSEC) 20 MG capsule Take 1 capsule (20 mg total) by mouth daily. Patient not taking: Reported on 05/07/2014 04/21/14   Nyoka CowdenMichael B Wert, MD  oxyCODONE-acetaminophen (PERCOCET) 5-325 MG per tablet Take 1-2 tablets by mouth every 4 (four) hours as needed for severe pain (take for severe pain not controlled with tramadol). Patient taking differently: Take 1-2 tablets by mouth every 4 (four) hours as needed for moderate pain or severe pain (take for severe pain not controlled with tramadol). 1 tablet for moderate pain; 2 tablets for severe pain. 05/01/14   [name] Shuford, PA-C  polyethylene glycol (MIRALAX / GLYCOLAX) packet Take 17 g by mouth daily as needed for mild constipation.     Historical Provider, MD  polyethylene glycol (MIRALAX / GLYCOLAX) packet Take 17 g by mouth daily as needed for mild constipation. 04/30/14   Durene RomansMatthew Olin, MD  tamsulosin (FLOMAX) 0.4 MG CAPS capsule Take 1 capsule (0.4 mg total) by mouth daily. 04/30/14   Durene RomansMatthew Olin, MD  traMADol (ULTRAM) 50 MG tablet Take 1 tablet (50 mg total) by mouth every 6 (six) hours as needed (mild to moderate pain). 05/01/14   [name] Shuford, PA-C  valsartan (DIOVAN) 160 MG tablet Take 1 tablet (160 mg total) by mouth daily.  05/04/14   Nyoka CowdenMichael B Wert, MD   There were no vitals taken for this visit. Physical Exam  Constitutional: He is oriented to person, place, and time. He appears well-developed and well-nourished. No distress.  HENT:  Head: Normocephalic and atraumatic.  Mouth/Throat: No oropharyngeal exudate.  Eyes: EOM are normal. Pupils are equal, round, and reactive to light.  Neck: Normal range of motion. Neck supple.  Cardiovascular: Normal rate and regular rhythm.  Exam reveals no friction rub.   No murmur heard. Pulmonary/Chest: Effort normal and breath sounds normal. No respiratory distress. He has no wheezes. He has no rales.  Abdominal: Soft. He exhibits no distension. There is  no tenderness. There is no rebound.  Genitourinary:  Foley balloon in distal penis just proximal to the glans. Blood at meatus.  Musculoskeletal: Normal range of motion. He exhibits no edema.  Neurological: He is alert and oriented to person, place, and time.  Skin: No rash noted. He is not diaphoretic.  Nursing note and vitals reviewed.   ED Course  Procedures (including critical care time) Labs Review Labs Reviewed  CBC  BASIC METABOLIC PANEL  URINALYSIS, ROUTINE W REFLEX MICROSCOPIC  I-STAT TROPOININ, ED    Imaging Review Dg Chest 2 View  05/23/2014   CLINICAL DATA:  Patient fell at home. History of a heart attack, hypertension and coronary artery disease.  EXAM: CHEST  2 VIEW  COMPARISON:  04/23/2014  FINDINGS: Lung volumes are low.  Allowing this, the lungs are clear.  Cardiac silhouette is normal in size. The aorta is uncoiled. No mediastinal or hilar masses.  No pleural effusion or pneumothorax.  Bony thorax is diffusely demineralized.  IMPRESSION: No acute cardiopulmonary disease.   Electronically Signed   By: Amie Portland M.D.   On: 05/23/2014 11:29   Dg Tibia/fibula Right  05/23/2014   CLINICAL DATA:  Status post fall today.  Right lower leg pain.  EXAM: RIGHT TIBIA AND FIBULA - 2 VIEW  COMPARISON:   None.  FINDINGS: There is no acute bony or joint abnormality. No notable degenerative change is seen about the knee or ankle. Atherosclerotic calcifications are noted.  IMPRESSION: No acute abnormality.   Electronically Signed   By: Drusilla Kanner M.D.   On: 05/23/2014 14:30   Ct Head Wo Contrast  05/23/2014   CLINICAL DATA:  Right periorbital laceration and bruising after falling yesterday. Dementia.  EXAM: CT HEAD WITHOUT CONTRAST  CT CERVICAL SPINE WITHOUT CONTRAST  TECHNIQUE: Multidetector CT imaging of the head and cervical spine was performed following the standard protocol without intravenous contrast. Multiplanar CT image reconstructions of the cervical spine were also generated.  COMPARISON:  Brain MR dated 03/26/2013.  FINDINGS: CT HEAD FINDINGS  Diffusely enlarged ventricles and subarachnoid spaces. Patchy white matter low density in both cerebral hemispheres. Mild right lateral periorbital soft tissue thickening. No skull fracture, intracranial hemorrhage or paranasal sinus air-fluid levels. Surgical absence of the medial walls of the maxillary sinuses. Mild left maxillary sinus mucosal thickening. Small amount of retained secretions in the sphenoid sinus on the left. Mild left frontal sinus mucosal thickening.  CT CERVICAL SPINE FINDINGS  Moderate dextroconvex cervical scoliosis. Multilevel degenerative changes. No prevertebral soft tissue swelling, fractures or subluxations. Mild bullous changes at both lung apices.  IMPRESSION: 1. No skull fracture or intracranial hemorrhage. 2. No cervical spine fracture or subluxation. 3. Atrophy and chronic small vessel white matter ischemic changes. 4. Mild chronic left frontal and maxillary sinusitis. 5. Cervical spine degenerative changes. 6. COPD.   Electronically Signed   By: Beckie Salts M.D.   On: 05/23/2014 11:23   Ct Cervical Spine Wo Contrast  05/23/2014   CLINICAL DATA:  Right periorbital laceration and bruising after falling yesterday. Dementia.   EXAM: CT HEAD WITHOUT CONTRAST  CT CERVICAL SPINE WITHOUT CONTRAST  TECHNIQUE: Multidetector CT imaging of the head and cervical spine was performed following the standard protocol without intravenous contrast. Multiplanar CT image reconstructions of the cervical spine were also generated.  COMPARISON:  Brain MR dated 03/26/2013.  FINDINGS: CT HEAD FINDINGS  Diffusely enlarged ventricles and subarachnoid spaces. Patchy white matter low density in both cerebral hemispheres. Mild right lateral  periorbital soft tissue thickening. No skull fracture, intracranial hemorrhage or paranasal sinus air-fluid levels. Surgical absence of the medial walls of the maxillary sinuses. Mild left maxillary sinus mucosal thickening. Small amount of retained secretions in the sphenoid sinus on the left. Mild left frontal sinus mucosal thickening.  CT CERVICAL SPINE FINDINGS  Moderate dextroconvex cervical scoliosis. Multilevel degenerative changes. No prevertebral soft tissue swelling, fractures or subluxations. Mild bullous changes at both lung apices.  IMPRESSION: 1. No skull fracture or intracranial hemorrhage. 2. No cervical spine fracture or subluxation. 3. Atrophy and chronic small vessel white matter ischemic changes. 4. Mild chronic left frontal and maxillary sinusitis. 5. Cervical spine degenerative changes. 6. COPD.   Electronically Signed   By: Beckie Salts M.D.   On: 05/23/2014 11:23   Dg Foot Complete Right  05/23/2014   CLINICAL DATA:  Larey Seat today.  Right foot pain.  EXAM: RIGHT FOOT COMPLETE - 3+ VIEW  COMPARISON:  None.  FINDINGS: The joint spaces are maintained. No acute fracture is identified. Suspect remote healed fracture involving the distal fifth metatarsal shaft.  IMPRESSION: No acute fracture.   Electronically Signed   By: Rudie Meyer M.D.   On: 05/23/2014 14:32     EKG Interpretation   Date/Time:  Sunday May 23 2014 10:49:12 EDT Ventricular Rate:  93 PR Interval:  152 QRS Duration: 111 QT  Interval:  359 QTC Calculation: 446 R Axis:   -52 Text Interpretation:  Sinus rhythm Abnormal R-wave progression, early  transition Inferior infarct, old No significant change since last tracing  Confirmed by Gwendolyn Grant  MD, Jayelyn Barno [phone]) on 05/23/2014 10:51:01 AM      Editor: Kern Alberta, RVS (Cardiovascular Sonographer)     Expand All Collapse All   VASCULAR LAB PRELIMINARY PRELIMINARY PRELIMINARY PRELIMINARY  Right lower extremity venous Doppler completed.   Preliminary report: There is no obvious evidence of DVT or SVT noted in the right lower extremity.  KANADY, CANDACE, RVT 05/23/2014, 3:43 PM       MDM   Final diagnoses:  Fall  Urinary catheter complication, initial encounter  Right leg swelling    81 year old male here with a fall and pulled out Foley. Fell yesterday, is on aspirin. Has small lack of her right eyebrow which is hard hemostatic and healing by secondary intention. Wife notes is more confused than normal. Normal he's alert and conscious and oriented but does have some dementia. He was noted to provide his Foley 2 weeks ago. Foley was in town this morning. Balloon is felt in penile shaft just proximal to the glans. Balloon was deflated with manual pressure on the shaft and then Foley was removed with subsequent bright red blood at the meatus. Of note 2 weeks ago he had to have transient penile puncture of the balloon to deflated and then had urology change on his Foley. Will speak to urology before attempting replacement. Urology gave instructions on how to successfully place a Coude catheter and we were successful in placement in the ED. Labs with no white count, no anemia. UA with positive nitrite, but only 3-6 whites. This is likely due to the extensive amount of bleeding from his urethral trauma. Will hold off on antibiotics. Culture ordered. Wife came and reported on some R leg swelling - [name] negative. Negative xrays. Patient stable to return home and  f/u with Urology and PCP.  Elwin Mocha, MD 05/23/14 [phone]

## 2014-05-23 NOTE — Discharge Instructions (Signed)
Edema °Edema is an abnormal buildup of fluids in your body tissues. Edema is somewhat dependent on gravity to pull the fluid to the lowest place in your body. That makes the condition more common in the legs and thighs (lower extremities). Painless swelling of the feet and ankles is common and becomes more likely as you get older. It is also common in looser tissues, like around your eyes.  °When the affected area is squeezed, the fluid may move out of that spot and leave a dent for a few moments. This dent is called pitting.  °CAUSES  °There are many possible causes of edema. Eating too much salt and being on your feet or sitting for a long time can cause edema in your legs and ankles. Hot weather may make edema worse. Common medical causes of edema include: °· Heart failure. °· Liver disease. °· Kidney disease. °· Weak blood vessels in your legs. °· Cancer. °· An injury. °· Pregnancy. °· Some medications. °· Obesity.  °SYMPTOMS  °Edema is usually painless. Your skin may look swollen or shiny.  °DIAGNOSIS  °Your health care provider may be able to diagnose edema by asking about your medical history and doing a physical exam. You may need to have tests such as X-rays, an electrocardiogram, or blood tests to check for medical conditions that may cause edema.  °TREATMENT  °Edema treatment depends on the cause. If you have heart, liver, or kidney disease, you need the treatment appropriate for these conditions. General treatment may include: °· Elevation of the affected body part above the level of your heart. °· Compression of the affected body part. Pressure from elastic bandages or support stockings squeezes the tissues and forces fluid back into the blood vessels. This keeps fluid from entering the tissues. °· Restriction of fluid and salt intake. °· Use of a water pill (diuretic). These medications are appropriate only for some types of edema. They pull fluid out of your body and make you urinate more often. This  gets rid of fluid and reduces swelling, but diuretics can have side effects. Only use diuretics as directed by your health care provider. °HOME CARE INSTRUCTIONS  °· Keep the affected body part above the level of your heart when you are lying down.   °· Do not sit still or stand for prolonged periods.   °· Do not put anything directly under your knees when lying down. °· Do not wear constricting clothing or garters on your upper legs.   °· Exercise your legs to work the fluid back into your blood vessels. This may help the swelling go down.   °· Wear elastic bandages or support stockings to reduce ankle swelling as directed by your health care provider.   °· Eat a low-salt diet to reduce fluid if your health care provider recommends it.   °· Only take medicines as directed by your health care provider.  °SEEK MEDICAL CARE IF:  °· Your edema is not responding to treatment. °· You have heart, liver, or kidney disease and notice symptoms of edema. °· You have edema in your legs that does not improve after elevating them.   °· You have sudden and unexplained weight gain. °SEEK IMMEDIATE MEDICAL CARE IF:  °· You develop shortness of breath or chest pain.   °· You cannot breathe when you lie down. °· You develop pain, redness, or warmth in the swollen areas.   °· You have heart, liver, or kidney disease and suddenly get edema. °· You have a fever and your symptoms suddenly get worse. °MAKE SURE YOU:  °·   Understand these instructions.  Will watch your condition.  Will get help right away if you are not doing well or get worse. Document Released: 01/22/2005 Document Revised: 06/08/2013 Document Reviewed: 11/14/2012 [facility]Exit[facility] Patient Information 2015 [place], [place]. This information is not intended to replace advice given to you by your health care provider. Make sure you discuss any questions you have with your health care provider.  Fall Prevention and Home Safety Falls cause injuries and can affect all age  groups. It is possible to use preventive measures to significantly decrease the likelihood of falls. There are many simple measures which can make your home safer and prevent falls. OUTDOORS  Repair cracks and edges of walkways and driveways.  Remove high doorway thresholds.  Trim shrubbery on the main path into your home.  Have good outside lighting.  Clear walkways of tools, rocks, debris, and clutter.  Check that handrails are not broken and are securely fastened. Both sides of steps should have handrails.  Have leaves, snow, and ice cleared regularly.  Use sand or salt on walkways during winter months.  In the garage, clean up grease or oil spills. BATHROOM  Install night lights.  Install grab bars by the toilet and in the tub and shower.  Use non-skid mats or decals in the tub or shower.  Place a plastic non-slip stool in the shower to sit on, if needed.  Keep floors dry and clean up all water on the floor immediately.  Remove soap buildup in the tub or shower on a regular basis.  Secure bath mats with non-slip, double-sided rug tape.  Remove throw rugs and tripping hazards from the floors. BEDROOMS  Install night lights.  Make sure a bedside light is easy to reach.  Do not use oversized bedding.  Keep a telephone by your bedside.  Have a firm chair with side arms to use for getting dressed.  Remove throw rugs and tripping hazards from the floor. KITCHEN  Keep handles on pots and pans turned toward the center of the stove. Use back burners when possible.  Clean up spills quickly and allow time for drying.  Avoid walking on wet floors.  Avoid hot utensils and knives.  Position shelves so they are not too high or low.  Place commonly used objects within easy reach.  If necessary, use a sturdy step stool with a grab bar when reaching.  Keep electrical cables out of the way.  Do not use floor polish or wax that makes floors slippery. If you must  use wax, use non-skid floor wax.  Remove throw rugs and tripping hazards from the floor. STAIRWAYS  Never leave objects on stairs.  Place handrails on both sides of stairways and use them. Fix any loose handrails. Make sure handrails on both sides of the stairways are as long as the stairs.  Check carpeting to make sure it is firmly attached along stairs. Make repairs to worn or loose carpet promptly.  Avoid placing throw rugs at the top or bottom of stairways, or properly secure the rug with carpet tape to prevent slippage. Get rid of throw rugs, if possible.  Have an electrician put in a light switch at the top and bottom of the stairs. OTHER FALL PREVENTION TIPS  Wear low-heel or rubber-soled shoes that are supportive and fit well. Wear closed toe shoes.  When using a stepladder, make sure it is fully opened and both spreaders are firmly locked. Do not climb a closed stepladder.  Add color  or contrast paint or tape to grab bars and handrails in your home. Place contrasting color strips on first and last steps.  Learn and use mobility aids as needed. Install an electrical emergency response system.  Turn on lights to avoid dark areas. Replace light bulbs that burn out immediately. Get light switches that glow.  Arrange furniture to create clear pathways. Keep furniture in the same place.  Firmly attach carpet with non-skid or double-sided tape.  Eliminate uneven floor surfaces.  Select a carpet pattern that does not visually hide the edge of steps.  Be aware of all pets. OTHER HOME SAFETY TIPS  Set the water temperature for 120 F (48.8 C).  Keep emergency numbers on or near the telephone.  Keep smoke detectors on every level of the home and near sleeping areas. Document Released: 01/12/2002 Document Revised: 07/24/2011 Document Reviewed: 04/13/2011 [facility] Patient Information 2015 [place], [place]. This information is not intended to replace advice given to you by your  health care provider. Make sure you discuss any questions you have with your health care provider.

## 2014-05-23 NOTE — ED Notes (Addendum)
Pt sent from [facility] for pulling out foley catheter.  PER EMS the catheter is not fully out, the balloon is inflated near the end of the penis.  Staff was afraid to remove the catheter entirely b/c they were afraid of hemorrhage.  Pt arrived on backboard d/t unwitnessed fall yesterday at 6 am.  Lac to R eye and bruising to R lat chest. Wife states that pt asks repeated questions since falling yesterday morning. Pt has post-surgical memory loss, per wife, not dementia.

## 2014-05-24 DIAGNOSIS — Z96611 Presence of right artificial shoulder joint: Secondary | ICD-10-CM | POA: Diagnosis not present

## 2014-05-24 DIAGNOSIS — Z471 Aftercare following joint replacement surgery: Secondary | ICD-10-CM | POA: Diagnosis not present

## 2014-05-24 LAB — URINE CULTURE
COLONY COUNT: NO GROWTH
Culture: NO GROWTH
Special Requests: NORMAL

## 2014-05-25 DIAGNOSIS — I10 Essential (primary) hypertension: Secondary | ICD-10-CM | POA: Diagnosis not present

## 2014-05-25 DIAGNOSIS — R279 Unspecified lack of coordination: Secondary | ICD-10-CM | POA: Diagnosis not present

## 2014-05-25 DIAGNOSIS — I2510 Atherosclerotic heart disease of native coronary artery without angina pectoris: Secondary | ICD-10-CM | POA: Diagnosis not present

## 2014-05-25 DIAGNOSIS — N390 Urinary tract infection, site not specified: Secondary | ICD-10-CM | POA: Diagnosis not present

## 2014-05-25 DIAGNOSIS — Z471 Aftercare following joint replacement surgery: Secondary | ICD-10-CM | POA: Diagnosis not present

## 2014-05-25 DIAGNOSIS — F329 Major depressive disorder, single episode, unspecified: Secondary | ICD-10-CM | POA: Diagnosis not present

## 2014-05-25 DIAGNOSIS — Z466 Encounter for fitting and adjustment of urinary device: Secondary | ICD-10-CM | POA: Diagnosis not present

## 2014-05-25 DIAGNOSIS — R339 Retention of urine, unspecified: Secondary | ICD-10-CM | POA: Diagnosis not present

## 2014-05-25 DIAGNOSIS — S50901D Unspecified superficial injury of right elbow, subsequent encounter: Secondary | ICD-10-CM | POA: Diagnosis not present

## 2014-05-26 DIAGNOSIS — R279 Unspecified lack of coordination: Secondary | ICD-10-CM | POA: Diagnosis not present

## 2014-05-26 DIAGNOSIS — Z471 Aftercare following joint replacement surgery: Secondary | ICD-10-CM | POA: Diagnosis not present

## 2014-05-26 DIAGNOSIS — S50901D Unspecified superficial injury of right elbow, subsequent encounter: Secondary | ICD-10-CM | POA: Diagnosis not present

## 2014-05-26 DIAGNOSIS — R339 Retention of urine, unspecified: Secondary | ICD-10-CM | POA: Diagnosis not present

## 2014-05-26 DIAGNOSIS — Z466 Encounter for fitting and adjustment of urinary device: Secondary | ICD-10-CM | POA: Diagnosis not present

## 2014-05-26 DIAGNOSIS — I10 Essential (primary) hypertension: Secondary | ICD-10-CM | POA: Diagnosis not present

## 2014-05-26 DIAGNOSIS — I2510 Atherosclerotic heart disease of native coronary artery without angina pectoris: Secondary | ICD-10-CM | POA: Diagnosis not present

## 2014-05-26 DIAGNOSIS — N390 Urinary tract infection, site not specified: Secondary | ICD-10-CM | POA: Diagnosis not present

## 2014-05-26 DIAGNOSIS — F329 Major depressive disorder, single episode, unspecified: Secondary | ICD-10-CM | POA: Diagnosis not present

## 2014-05-27 DIAGNOSIS — R339 Retention of urine, unspecified: Secondary | ICD-10-CM | POA: Diagnosis not present

## 2014-05-28 DIAGNOSIS — R339 Retention of urine, unspecified: Secondary | ICD-10-CM | POA: Diagnosis not present

## 2014-05-28 DIAGNOSIS — Z471 Aftercare following joint replacement surgery: Secondary | ICD-10-CM | POA: Diagnosis not present

## 2014-05-28 DIAGNOSIS — R279 Unspecified lack of coordination: Secondary | ICD-10-CM | POA: Diagnosis not present

## 2014-05-28 DIAGNOSIS — N390 Urinary tract infection, site not specified: Secondary | ICD-10-CM | POA: Diagnosis not present

## 2014-05-28 DIAGNOSIS — Z466 Encounter for fitting and adjustment of urinary device: Secondary | ICD-10-CM | POA: Diagnosis not present

## 2014-05-28 DIAGNOSIS — S50901D Unspecified superficial injury of right elbow, subsequent encounter: Secondary | ICD-10-CM | POA: Diagnosis not present

## 2014-05-28 DIAGNOSIS — I2510 Atherosclerotic heart disease of native coronary artery without angina pectoris: Secondary | ICD-10-CM | POA: Diagnosis not present

## 2014-05-28 DIAGNOSIS — I10 Essential (primary) hypertension: Secondary | ICD-10-CM | POA: Diagnosis not present

## 2014-05-28 DIAGNOSIS — F329 Major depressive disorder, single episode, unspecified: Secondary | ICD-10-CM | POA: Diagnosis not present

## 2014-05-31 ENCOUNTER — Other Ambulatory Visit: Payer: Commercial Managed Care - HMO

## 2014-05-31 DIAGNOSIS — N390 Urinary tract infection, site not specified: Secondary | ICD-10-CM | POA: Diagnosis not present

## 2014-05-31 DIAGNOSIS — S50901D Unspecified superficial injury of right elbow, subsequent encounter: Secondary | ICD-10-CM | POA: Diagnosis not present

## 2014-05-31 DIAGNOSIS — I10 Essential (primary) hypertension: Secondary | ICD-10-CM | POA: Diagnosis not present

## 2014-05-31 DIAGNOSIS — R279 Unspecified lack of coordination: Secondary | ICD-10-CM | POA: Diagnosis not present

## 2014-05-31 DIAGNOSIS — Z466 Encounter for fitting and adjustment of urinary device: Secondary | ICD-10-CM | POA: Diagnosis not present

## 2014-05-31 DIAGNOSIS — R339 Retention of urine, unspecified: Secondary | ICD-10-CM | POA: Diagnosis not present

## 2014-05-31 DIAGNOSIS — I2510 Atherosclerotic heart disease of native coronary artery without angina pectoris: Secondary | ICD-10-CM | POA: Diagnosis not present

## 2014-05-31 DIAGNOSIS — Z471 Aftercare following joint replacement surgery: Secondary | ICD-10-CM | POA: Diagnosis not present

## 2014-05-31 DIAGNOSIS — F329 Major depressive disorder, single episode, unspecified: Secondary | ICD-10-CM | POA: Diagnosis not present

## 2014-06-01 DIAGNOSIS — Z471 Aftercare following joint replacement surgery: Secondary | ICD-10-CM | POA: Diagnosis not present

## 2014-06-01 DIAGNOSIS — F329 Major depressive disorder, single episode, unspecified: Secondary | ICD-10-CM | POA: Diagnosis not present

## 2014-06-01 DIAGNOSIS — I10 Essential (primary) hypertension: Secondary | ICD-10-CM | POA: Diagnosis not present

## 2014-06-01 DIAGNOSIS — I2510 Atherosclerotic heart disease of native coronary artery without angina pectoris: Secondary | ICD-10-CM | POA: Diagnosis not present

## 2014-06-01 DIAGNOSIS — R339 Retention of urine, unspecified: Secondary | ICD-10-CM | POA: Diagnosis not present

## 2014-06-01 DIAGNOSIS — S50901D Unspecified superficial injury of right elbow, subsequent encounter: Secondary | ICD-10-CM | POA: Diagnosis not present

## 2014-06-01 DIAGNOSIS — N390 Urinary tract infection, site not specified: Secondary | ICD-10-CM | POA: Diagnosis not present

## 2014-06-01 DIAGNOSIS — R279 Unspecified lack of coordination: Secondary | ICD-10-CM | POA: Diagnosis not present

## 2014-06-01 DIAGNOSIS — Z466 Encounter for fitting and adjustment of urinary device: Secondary | ICD-10-CM | POA: Diagnosis not present

## 2014-06-02 DIAGNOSIS — N138 Other obstructive and reflux uropathy: Secondary | ICD-10-CM | POA: Diagnosis not present

## 2014-06-02 DIAGNOSIS — R339 Retention of urine, unspecified: Secondary | ICD-10-CM | POA: Diagnosis not present

## 2014-06-02 DIAGNOSIS — N312 Flaccid neuropathic bladder, not elsewhere classified: Secondary | ICD-10-CM | POA: Diagnosis not present

## 2014-06-02 DIAGNOSIS — N401 Enlarged prostate with lower urinary tract symptoms: Secondary | ICD-10-CM | POA: Diagnosis not present

## 2014-06-03 DIAGNOSIS — R339 Retention of urine, unspecified: Secondary | ICD-10-CM | POA: Diagnosis not present

## 2014-06-03 DIAGNOSIS — Z466 Encounter for fitting and adjustment of urinary device: Secondary | ICD-10-CM | POA: Diagnosis not present

## 2014-06-03 DIAGNOSIS — N390 Urinary tract infection, site not specified: Secondary | ICD-10-CM | POA: Diagnosis not present

## 2014-06-03 DIAGNOSIS — I10 Essential (primary) hypertension: Secondary | ICD-10-CM | POA: Diagnosis not present

## 2014-06-03 DIAGNOSIS — F329 Major depressive disorder, single episode, unspecified: Secondary | ICD-10-CM | POA: Diagnosis not present

## 2014-06-03 DIAGNOSIS — Z471 Aftercare following joint replacement surgery: Secondary | ICD-10-CM | POA: Diagnosis not present

## 2014-06-03 DIAGNOSIS — S50901D Unspecified superficial injury of right elbow, subsequent encounter: Secondary | ICD-10-CM | POA: Diagnosis not present

## 2014-06-03 DIAGNOSIS — R279 Unspecified lack of coordination: Secondary | ICD-10-CM | POA: Diagnosis not present

## 2014-06-03 DIAGNOSIS — I2510 Atherosclerotic heart disease of native coronary artery without angina pectoris: Secondary | ICD-10-CM | POA: Diagnosis not present

## 2014-06-04 ENCOUNTER — Ambulatory Visit: Payer: Self-pay | Admitting: Neurology

## 2014-06-04 DIAGNOSIS — Z466 Encounter for fitting and adjustment of urinary device: Secondary | ICD-10-CM | POA: Diagnosis not present

## 2014-06-04 DIAGNOSIS — R339 Retention of urine, unspecified: Secondary | ICD-10-CM | POA: Diagnosis not present

## 2014-06-04 DIAGNOSIS — S50901D Unspecified superficial injury of right elbow, subsequent encounter: Secondary | ICD-10-CM | POA: Diagnosis not present

## 2014-06-04 DIAGNOSIS — Z471 Aftercare following joint replacement surgery: Secondary | ICD-10-CM | POA: Diagnosis not present

## 2014-06-04 DIAGNOSIS — F329 Major depressive disorder, single episode, unspecified: Secondary | ICD-10-CM | POA: Diagnosis not present

## 2014-06-04 DIAGNOSIS — I2510 Atherosclerotic heart disease of native coronary artery without angina pectoris: Secondary | ICD-10-CM | POA: Diagnosis not present

## 2014-06-04 DIAGNOSIS — N390 Urinary tract infection, site not specified: Secondary | ICD-10-CM | POA: Diagnosis not present

## 2014-06-04 DIAGNOSIS — R279 Unspecified lack of coordination: Secondary | ICD-10-CM | POA: Diagnosis not present

## 2014-06-04 DIAGNOSIS — I10 Essential (primary) hypertension: Secondary | ICD-10-CM | POA: Diagnosis not present

## 2014-06-07 ENCOUNTER — Encounter: Payer: Self-pay | Admitting: Neurology

## 2014-06-07 DIAGNOSIS — F329 Major depressive disorder, single episode, unspecified: Secondary | ICD-10-CM | POA: Diagnosis not present

## 2014-06-07 DIAGNOSIS — I10 Essential (primary) hypertension: Secondary | ICD-10-CM | POA: Diagnosis not present

## 2014-06-07 DIAGNOSIS — R339 Retention of urine, unspecified: Secondary | ICD-10-CM | POA: Diagnosis not present

## 2014-06-07 DIAGNOSIS — R279 Unspecified lack of coordination: Secondary | ICD-10-CM | POA: Diagnosis not present

## 2014-06-07 DIAGNOSIS — Z466 Encounter for fitting and adjustment of urinary device: Secondary | ICD-10-CM | POA: Diagnosis not present

## 2014-06-07 DIAGNOSIS — S50901D Unspecified superficial injury of right elbow, subsequent encounter: Secondary | ICD-10-CM | POA: Diagnosis not present

## 2014-06-07 DIAGNOSIS — N390 Urinary tract infection, site not specified: Secondary | ICD-10-CM | POA: Diagnosis not present

## 2014-06-07 DIAGNOSIS — I2510 Atherosclerotic heart disease of native coronary artery without angina pectoris: Secondary | ICD-10-CM | POA: Diagnosis not present

## 2014-06-07 DIAGNOSIS — Z471 Aftercare following joint replacement surgery: Secondary | ICD-10-CM | POA: Diagnosis not present

## 2014-06-08 DIAGNOSIS — J209 Acute bronchitis, unspecified: Secondary | ICD-10-CM | POA: Diagnosis not present

## 2014-06-08 DIAGNOSIS — F329 Major depressive disorder, single episode, unspecified: Secondary | ICD-10-CM | POA: Diagnosis not present

## 2014-06-08 DIAGNOSIS — R410 Disorientation, unspecified: Secondary | ICD-10-CM | POA: Diagnosis not present

## 2014-06-08 DIAGNOSIS — R339 Retention of urine, unspecified: Secondary | ICD-10-CM | POA: Diagnosis not present

## 2014-06-08 DIAGNOSIS — Z471 Aftercare following joint replacement surgery: Secondary | ICD-10-CM | POA: Diagnosis not present

## 2014-06-08 DIAGNOSIS — R279 Unspecified lack of coordination: Secondary | ICD-10-CM | POA: Diagnosis not present

## 2014-06-08 DIAGNOSIS — S50901D Unspecified superficial injury of right elbow, subsequent encounter: Secondary | ICD-10-CM | POA: Diagnosis not present

## 2014-06-08 DIAGNOSIS — I10 Essential (primary) hypertension: Secondary | ICD-10-CM | POA: Diagnosis not present

## 2014-06-08 DIAGNOSIS — I2510 Atherosclerotic heart disease of native coronary artery without angina pectoris: Secondary | ICD-10-CM | POA: Diagnosis not present

## 2014-06-08 DIAGNOSIS — Z466 Encounter for fitting and adjustment of urinary device: Secondary | ICD-10-CM | POA: Diagnosis not present

## 2014-06-08 DIAGNOSIS — N390 Urinary tract infection, site not specified: Secondary | ICD-10-CM | POA: Diagnosis not present

## 2014-06-09 DIAGNOSIS — F329 Major depressive disorder, single episode, unspecified: Secondary | ICD-10-CM | POA: Diagnosis not present

## 2014-06-09 DIAGNOSIS — Z471 Aftercare following joint replacement surgery: Secondary | ICD-10-CM | POA: Diagnosis not present

## 2014-06-09 DIAGNOSIS — S50901D Unspecified superficial injury of right elbow, subsequent encounter: Secondary | ICD-10-CM | POA: Diagnosis not present

## 2014-06-09 DIAGNOSIS — R279 Unspecified lack of coordination: Secondary | ICD-10-CM | POA: Diagnosis not present

## 2014-06-09 DIAGNOSIS — N390 Urinary tract infection, site not specified: Secondary | ICD-10-CM | POA: Diagnosis not present

## 2014-06-09 DIAGNOSIS — R339 Retention of urine, unspecified: Secondary | ICD-10-CM | POA: Diagnosis not present

## 2014-06-09 DIAGNOSIS — Z466 Encounter for fitting and adjustment of urinary device: Secondary | ICD-10-CM | POA: Diagnosis not present

## 2014-06-09 DIAGNOSIS — I2510 Atherosclerotic heart disease of native coronary artery without angina pectoris: Secondary | ICD-10-CM | POA: Diagnosis not present

## 2014-06-09 DIAGNOSIS — I10 Essential (primary) hypertension: Secondary | ICD-10-CM | POA: Diagnosis not present

## 2014-06-11 DIAGNOSIS — Z471 Aftercare following joint replacement surgery: Secondary | ICD-10-CM | POA: Diagnosis not present

## 2014-06-11 DIAGNOSIS — N390 Urinary tract infection, site not specified: Secondary | ICD-10-CM | POA: Diagnosis not present

## 2014-06-11 DIAGNOSIS — R279 Unspecified lack of coordination: Secondary | ICD-10-CM | POA: Diagnosis not present

## 2014-06-14 DIAGNOSIS — I2510 Atherosclerotic heart disease of native coronary artery without angina pectoris: Secondary | ICD-10-CM | POA: Diagnosis not present

## 2014-06-14 DIAGNOSIS — Z466 Encounter for fitting and adjustment of urinary device: Secondary | ICD-10-CM | POA: Diagnosis not present

## 2014-06-14 DIAGNOSIS — N390 Urinary tract infection, site not specified: Secondary | ICD-10-CM | POA: Diagnosis not present

## 2014-06-14 DIAGNOSIS — R279 Unspecified lack of coordination: Secondary | ICD-10-CM | POA: Diagnosis not present

## 2014-06-14 DIAGNOSIS — I10 Essential (primary) hypertension: Secondary | ICD-10-CM | POA: Diagnosis not present

## 2014-06-14 DIAGNOSIS — R339 Retention of urine, unspecified: Secondary | ICD-10-CM | POA: Diagnosis not present

## 2014-06-14 DIAGNOSIS — F329 Major depressive disorder, single episode, unspecified: Secondary | ICD-10-CM | POA: Diagnosis not present

## 2014-06-14 DIAGNOSIS — Z471 Aftercare following joint replacement surgery: Secondary | ICD-10-CM | POA: Diagnosis not present

## 2014-06-14 DIAGNOSIS — S50901D Unspecified superficial injury of right elbow, subsequent encounter: Secondary | ICD-10-CM | POA: Diagnosis not present

## 2014-06-15 DIAGNOSIS — N390 Urinary tract infection, site not specified: Secondary | ICD-10-CM | POA: Diagnosis not present

## 2014-06-15 DIAGNOSIS — Z471 Aftercare following joint replacement surgery: Secondary | ICD-10-CM | POA: Diagnosis not present

## 2014-06-15 DIAGNOSIS — F329 Major depressive disorder, single episode, unspecified: Secondary | ICD-10-CM | POA: Diagnosis not present

## 2014-06-15 DIAGNOSIS — I10 Essential (primary) hypertension: Secondary | ICD-10-CM | POA: Diagnosis not present

## 2014-06-15 DIAGNOSIS — Z466 Encounter for fitting and adjustment of urinary device: Secondary | ICD-10-CM | POA: Diagnosis not present

## 2014-06-15 DIAGNOSIS — I2510 Atherosclerotic heart disease of native coronary artery without angina pectoris: Secondary | ICD-10-CM | POA: Diagnosis not present

## 2014-06-15 DIAGNOSIS — R339 Retention of urine, unspecified: Secondary | ICD-10-CM | POA: Diagnosis not present

## 2014-06-15 DIAGNOSIS — S50901D Unspecified superficial injury of right elbow, subsequent encounter: Secondary | ICD-10-CM | POA: Diagnosis not present

## 2014-06-15 DIAGNOSIS — R279 Unspecified lack of coordination: Secondary | ICD-10-CM | POA: Diagnosis not present

## 2014-06-16 DIAGNOSIS — Z466 Encounter for fitting and adjustment of urinary device: Secondary | ICD-10-CM | POA: Diagnosis not present

## 2014-06-16 DIAGNOSIS — R339 Retention of urine, unspecified: Secondary | ICD-10-CM | POA: Diagnosis not present

## 2014-06-16 DIAGNOSIS — I2510 Atherosclerotic heart disease of native coronary artery without angina pectoris: Secondary | ICD-10-CM | POA: Diagnosis not present

## 2014-06-16 DIAGNOSIS — I10 Essential (primary) hypertension: Secondary | ICD-10-CM | POA: Diagnosis not present

## 2014-06-16 DIAGNOSIS — R279 Unspecified lack of coordination: Secondary | ICD-10-CM | POA: Diagnosis not present

## 2014-06-16 DIAGNOSIS — S50901D Unspecified superficial injury of right elbow, subsequent encounter: Secondary | ICD-10-CM | POA: Diagnosis not present

## 2014-06-16 DIAGNOSIS — F329 Major depressive disorder, single episode, unspecified: Secondary | ICD-10-CM | POA: Diagnosis not present

## 2014-06-16 DIAGNOSIS — N390 Urinary tract infection, site not specified: Secondary | ICD-10-CM | POA: Diagnosis not present

## 2014-06-16 DIAGNOSIS — Z471 Aftercare following joint replacement surgery: Secondary | ICD-10-CM | POA: Diagnosis not present

## 2014-06-28 DIAGNOSIS — Z471 Aftercare following joint replacement surgery: Secondary | ICD-10-CM | POA: Diagnosis not present

## 2014-06-28 DIAGNOSIS — I10 Essential (primary) hypertension: Secondary | ICD-10-CM | POA: Diagnosis not present

## 2014-06-28 DIAGNOSIS — N390 Urinary tract infection, site not specified: Secondary | ICD-10-CM | POA: Diagnosis not present

## 2014-06-28 DIAGNOSIS — S50901D Unspecified superficial injury of right elbow, subsequent encounter: Secondary | ICD-10-CM | POA: Diagnosis not present

## 2014-06-28 DIAGNOSIS — Z466 Encounter for fitting and adjustment of urinary device: Secondary | ICD-10-CM | POA: Diagnosis not present

## 2014-06-28 DIAGNOSIS — I2510 Atherosclerotic heart disease of native coronary artery without angina pectoris: Secondary | ICD-10-CM | POA: Diagnosis not present

## 2014-06-28 DIAGNOSIS — R339 Retention of urine, unspecified: Secondary | ICD-10-CM | POA: Diagnosis not present

## 2014-06-28 DIAGNOSIS — F329 Major depressive disorder, single episode, unspecified: Secondary | ICD-10-CM | POA: Diagnosis not present

## 2014-06-28 DIAGNOSIS — R279 Unspecified lack of coordination: Secondary | ICD-10-CM | POA: Diagnosis not present

## 2014-06-30 ENCOUNTER — Other Ambulatory Visit: Payer: Self-pay | Admitting: Neurology

## 2014-06-30 NOTE — Telephone Encounter (Signed)
Has appt scheduled 08/19

## 2014-07-01 DIAGNOSIS — R279 Unspecified lack of coordination: Secondary | ICD-10-CM | POA: Diagnosis not present

## 2014-07-01 DIAGNOSIS — I2510 Atherosclerotic heart disease of native coronary artery without angina pectoris: Secondary | ICD-10-CM | POA: Diagnosis not present

## 2014-07-01 DIAGNOSIS — N390 Urinary tract infection, site not specified: Secondary | ICD-10-CM | POA: Diagnosis not present

## 2014-07-01 DIAGNOSIS — Z471 Aftercare following joint replacement surgery: Secondary | ICD-10-CM | POA: Diagnosis not present

## 2014-07-01 DIAGNOSIS — S50901D Unspecified superficial injury of right elbow, subsequent encounter: Secondary | ICD-10-CM | POA: Diagnosis not present

## 2014-07-01 DIAGNOSIS — R339 Retention of urine, unspecified: Secondary | ICD-10-CM | POA: Diagnosis not present

## 2014-07-01 DIAGNOSIS — I10 Essential (primary) hypertension: Secondary | ICD-10-CM | POA: Diagnosis not present

## 2014-07-01 DIAGNOSIS — Z466 Encounter for fitting and adjustment of urinary device: Secondary | ICD-10-CM | POA: Diagnosis not present

## 2014-07-01 DIAGNOSIS — F329 Major depressive disorder, single episode, unspecified: Secondary | ICD-10-CM | POA: Diagnosis not present

## 2014-07-07 DIAGNOSIS — Z471 Aftercare following joint replacement surgery: Secondary | ICD-10-CM | POA: Diagnosis not present

## 2014-07-07 DIAGNOSIS — S50901D Unspecified superficial injury of right elbow, subsequent encounter: Secondary | ICD-10-CM | POA: Diagnosis not present

## 2014-07-07 DIAGNOSIS — Z466 Encounter for fitting and adjustment of urinary device: Secondary | ICD-10-CM | POA: Diagnosis not present

## 2014-07-07 DIAGNOSIS — F329 Major depressive disorder, single episode, unspecified: Secondary | ICD-10-CM | POA: Diagnosis not present

## 2014-07-07 DIAGNOSIS — R279 Unspecified lack of coordination: Secondary | ICD-10-CM | POA: Diagnosis not present

## 2014-07-07 DIAGNOSIS — R339 Retention of urine, unspecified: Secondary | ICD-10-CM | POA: Diagnosis not present

## 2014-07-07 DIAGNOSIS — I10 Essential (primary) hypertension: Secondary | ICD-10-CM | POA: Diagnosis not present

## 2014-07-07 DIAGNOSIS — N390 Urinary tract infection, site not specified: Secondary | ICD-10-CM | POA: Diagnosis not present

## 2014-07-07 DIAGNOSIS — I2510 Atherosclerotic heart disease of native coronary artery without angina pectoris: Secondary | ICD-10-CM | POA: Diagnosis not present

## 2014-07-09 DIAGNOSIS — Z471 Aftercare following joint replacement surgery: Secondary | ICD-10-CM | POA: Diagnosis not present

## 2014-07-09 DIAGNOSIS — Z466 Encounter for fitting and adjustment of urinary device: Secondary | ICD-10-CM | POA: Diagnosis not present

## 2014-07-09 DIAGNOSIS — S50901D Unspecified superficial injury of right elbow, subsequent encounter: Secondary | ICD-10-CM | POA: Diagnosis not present

## 2014-07-09 DIAGNOSIS — N390 Urinary tract infection, site not specified: Secondary | ICD-10-CM | POA: Diagnosis not present

## 2014-07-09 DIAGNOSIS — I2510 Atherosclerotic heart disease of native coronary artery without angina pectoris: Secondary | ICD-10-CM | POA: Diagnosis not present

## 2014-07-09 DIAGNOSIS — R339 Retention of urine, unspecified: Secondary | ICD-10-CM | POA: Diagnosis not present

## 2014-07-09 DIAGNOSIS — R279 Unspecified lack of coordination: Secondary | ICD-10-CM | POA: Diagnosis not present

## 2014-07-09 DIAGNOSIS — F329 Major depressive disorder, single episode, unspecified: Secondary | ICD-10-CM | POA: Diagnosis not present

## 2014-07-09 DIAGNOSIS — I10 Essential (primary) hypertension: Secondary | ICD-10-CM | POA: Diagnosis not present

## 2014-07-12 DIAGNOSIS — Z471 Aftercare following joint replacement surgery: Secondary | ICD-10-CM | POA: Diagnosis not present

## 2014-07-12 DIAGNOSIS — Z96611 Presence of right artificial shoulder joint: Secondary | ICD-10-CM | POA: Diagnosis not present

## 2014-07-15 DIAGNOSIS — I10 Essential (primary) hypertension: Secondary | ICD-10-CM | POA: Diagnosis not present

## 2014-07-15 DIAGNOSIS — R279 Unspecified lack of coordination: Secondary | ICD-10-CM | POA: Diagnosis not present

## 2014-07-15 DIAGNOSIS — N390 Urinary tract infection, site not specified: Secondary | ICD-10-CM | POA: Diagnosis not present

## 2014-07-15 DIAGNOSIS — Z471 Aftercare following joint replacement surgery: Secondary | ICD-10-CM | POA: Diagnosis not present

## 2014-07-15 DIAGNOSIS — Z466 Encounter for fitting and adjustment of urinary device: Secondary | ICD-10-CM | POA: Diagnosis not present

## 2014-07-15 DIAGNOSIS — I2510 Atherosclerotic heart disease of native coronary artery without angina pectoris: Secondary | ICD-10-CM | POA: Diagnosis not present

## 2014-07-15 DIAGNOSIS — S50901D Unspecified superficial injury of right elbow, subsequent encounter: Secondary | ICD-10-CM | POA: Diagnosis not present

## 2014-07-15 DIAGNOSIS — R339 Retention of urine, unspecified: Secondary | ICD-10-CM | POA: Diagnosis not present

## 2014-07-15 DIAGNOSIS — F329 Major depressive disorder, single episode, unspecified: Secondary | ICD-10-CM | POA: Diagnosis not present

## 2014-07-21 DIAGNOSIS — N390 Urinary tract infection, site not specified: Secondary | ICD-10-CM | POA: Diagnosis not present

## 2014-07-21 DIAGNOSIS — Z466 Encounter for fitting and adjustment of urinary device: Secondary | ICD-10-CM | POA: Diagnosis not present

## 2014-07-21 DIAGNOSIS — R339 Retention of urine, unspecified: Secondary | ICD-10-CM | POA: Diagnosis not present

## 2014-07-21 DIAGNOSIS — I2510 Atherosclerotic heart disease of native coronary artery without angina pectoris: Secondary | ICD-10-CM | POA: Diagnosis not present

## 2014-07-21 DIAGNOSIS — I10 Essential (primary) hypertension: Secondary | ICD-10-CM | POA: Diagnosis not present

## 2014-07-21 DIAGNOSIS — R279 Unspecified lack of coordination: Secondary | ICD-10-CM | POA: Diagnosis not present

## 2014-07-21 DIAGNOSIS — S50901D Unspecified superficial injury of right elbow, subsequent encounter: Secondary | ICD-10-CM | POA: Diagnosis not present

## 2014-07-21 DIAGNOSIS — F329 Major depressive disorder, single episode, unspecified: Secondary | ICD-10-CM | POA: Diagnosis not present

## 2014-07-21 DIAGNOSIS — Z471 Aftercare following joint replacement surgery: Secondary | ICD-10-CM | POA: Diagnosis not present

## 2014-08-16 DIAGNOSIS — Z471 Aftercare following joint replacement surgery: Secondary | ICD-10-CM | POA: Diagnosis not present

## 2014-08-16 DIAGNOSIS — Z96611 Presence of right artificial shoulder joint: Secondary | ICD-10-CM | POA: Diagnosis not present

## 2014-08-17 DIAGNOSIS — I959 Hypotension, unspecified: Secondary | ICD-10-CM | POA: Diagnosis not present

## 2014-08-26 DIAGNOSIS — M19011 Primary osteoarthritis, right shoulder: Secondary | ICD-10-CM | POA: Diagnosis not present

## 2014-08-30 DIAGNOSIS — M19011 Primary osteoarthritis, right shoulder: Secondary | ICD-10-CM | POA: Diagnosis not present

## 2014-09-02 DIAGNOSIS — R739 Hyperglycemia, unspecified: Secondary | ICD-10-CM | POA: Diagnosis not present

## 2014-09-02 DIAGNOSIS — E559 Vitamin D deficiency, unspecified: Secondary | ICD-10-CM | POA: Diagnosis not present

## 2014-09-02 DIAGNOSIS — I10 Essential (primary) hypertension: Secondary | ICD-10-CM | POA: Diagnosis not present

## 2014-09-02 DIAGNOSIS — Z125 Encounter for screening for malignant neoplasm of prostate: Secondary | ICD-10-CM | POA: Diagnosis not present

## 2014-09-02 DIAGNOSIS — D81818 Other biotin-dependent carboxylase deficiency: Secondary | ICD-10-CM | POA: Diagnosis not present

## 2014-09-03 DIAGNOSIS — M19011 Primary osteoarthritis, right shoulder: Secondary | ICD-10-CM | POA: Diagnosis not present

## 2014-09-14 ENCOUNTER — Telehealth: Payer: Self-pay | Admitting: Neurology

## 2014-09-14 NOTE — Telephone Encounter (Signed)
Has appt scheduled.

## 2014-09-15 NOTE — Telephone Encounter (Signed)
[facility] [phone] called regarding Cerefolin NAC, Q was written for 30 and they only do a 90 day supply

## 2014-09-15 NOTE — Telephone Encounter (Signed)
I called back and spoke with Renae Fickle.  Verified Rx info.  They will proceed with order.

## 2014-09-24 ENCOUNTER — Encounter: Payer: Self-pay | Admitting: Neurology

## 2014-09-24 ENCOUNTER — Ambulatory Visit (INDEPENDENT_AMBULATORY_CARE_PROVIDER_SITE_OTHER): Payer: Commercial Managed Care - HMO | Admitting: Neurology

## 2014-09-24 VITALS — BP 138/89 | HR 73 | Ht 68.0 in | Wt 170.4 lb

## 2014-09-24 DIAGNOSIS — G3184 Mild cognitive impairment, so stated: Secondary | ICD-10-CM | POA: Diagnosis not present

## 2014-09-24 NOTE — Patient Instructions (Signed)
I had a long discussion with the patient and his wife regarding his mild cognitive impairment which appears stable. Continue Cerefolin daily as well as increased perspiration in mentally challenging activities like solving crossword puzzles, sudoku and playing bridge. Patient may also perhaps consider possible participation in the CREAD study if interested. Greater than 50% of this 25 minute visit time was spent on counseling and coordination of care. Return for follow-up in 6 months or call earlier if necessary. Memory Compensation Strategies  1. Use "WARM" strategy.  W= write it down  A= associate it  R= repeat it  M= make a mental note  2.   You can keep a Glass blower/designer.  Use a 3-ring notebook with sections for the following: calendar, important names and phone numbers,  medications, doctors' names/phone numbers, lists/reminders, and a section to journal what you did  each day.   3.    Use a calendar to write appointments down.  4.    Write yourself a schedule for the day.  This can be placed on the calendar or in a separate section of the Memory Notebook.  Keeping a  regular schedule can help memory.  5.    Use medication organizer with sections for each day or morning/evening pills.  You may need help loading it  6.    Keep a basket, or pegboard by the door.  Place items that you need to take out with you in the basket or on the pegboard.  You may also want to  include a message board for reminders.  7.    Use sticky notes.  Place sticky notes with reminders in a place where the task is performed.  For example: " turn off the  stove" placed by the stove, "lock the door" placed on the door at eye level, " take your medications" on  the bathroom mirror or by the place where you normally take your medications.  8.    Use alarms/timers.  Use while cooking to remind yourself to check on food or as a reminder to take your medicine, or as a  reminder to make a call, or as a reminder to  perform another task, etc.

## 2014-09-24 NOTE — Progress Notes (Signed)
[facility] [address] [place]. [place] [address] [phone]       OFFICE FOLLOW UP VISIT  NOTE  [name] Date of Birth:  1931/04/02 Medical Record Number:  [id number]   Referring MD:  Shary Decamp  Reason for Referral:  Confusion and memory loss  HPI:  Update 05/06/13 : He returns for followup after last visit 2 months ago. He is accompanied by his wife who states that she's noticed improvement in his memory and cognitive difficulties. He has been taking Cerefolin NAC daily as well as fish oil. He has also been under less stress recently and has started to in activities that he enjoys. He underwent diagnostic testing at the last visit which included EEG on 04/03/13 which was normal. MRI scan of the brain on 03/26/13 showed moderate changes of generalized cerebral atrophy and mild incidental changes of degenerative arthritis in the neck. He denies any symptoms of radiculopathy myelopathy gait difficulties her bladder bowel retention. Lab work on him 03/12/13 showed normal vitamin B12, RPR, TSH and homocystine was borderline elevated at 16.1. INITIAL Consult 03/12/13 :60 year Caucasian male who since spring of 2014 has had some intermittent confusion and memory difficulties as well as trouble driving. He has also had some balance difficulties and falls. Patient states he was under significant stress as he was going through a lawsuit involving her). Here started on Celexa 2 months ago by her primary physician but stopped it after a few weeks as he felt it did not help. Lawsuit fortunately got over last week. The patient feels that he is better now. This was also depressed which also seems to improve. Was withdrawn and not going or or participating in activities. He denies significant headache, seizure, loss of consciousness, focal weakness. He has not had any brain imaging studies done. There is no prior history of strokes, TIAs, seizures. Is no family history of dementia.  He has not specifically tried medications for improvement of I for treatment for depression. He had mini mental of 21/30 and Dr. Zebedee [name] office last month but today he did better with 25/30 in our office.  Update 09/24/2014 : He returns for follow-up after last visit more than a year ago. He is accompanied by his wife. Patient states that taking Cerefolin seems to have helped him. He continues to mild short-term memory difficulties but these are unchanged. He recently had right shoulder surgery in March following which he had a strained rehabilitation and he had some intermittent confusion and pulled out his Foley catheter a few times but wife thinks this may have been related to taking too many tablets of OxyContin. He did much better once) medications were discontinued. He has no other new complaints today ROS:   14 system review of systems is positive for  appetite change, fatigue, weight change, ringing in the ears, cough, wheezing, shortness of breath, chest tightness, restless legs, frequent waking, daytime sleepiness, allergies, difficulty urinating, joint and back pain, walking difficulty, easy bruising, speech difficulty, weakness, confusion, decreased concentration, nervousness and anxiety nd all other systems negative. PMH:  Past Medical History  Diagnosis Date  . Heart attack   . Depression with anxiety     takes Clonazepam nightly  . RLS (restless legs syndrome)   . Unspecified vitamin D deficiency   . Vitamin B 12 deficiency   . Allergic rhinitis   . Coronary artery disease     s/p remote IWMI 2004 with PCI of the RCA with BMS  and repeat cath 2007 due to recurrent CP and abnormal nuclear stress test and cath showed widely patent RCA stent with 30-50% distal stenosis before the takeoff of a PL and PDA and normal LVF.   [name] Hypertension     takes Diovan daily  . Constipation     takes Miralax daily as needed  . GERD (gastroesophageal reflux disease)     takes Omeprazole and Pepcid  daily  . Hyperlipidemia     takes Atorvastatin daily  . History of bronchitis     pulmonologist is Dr.Wert.  . Arthritis   . Joint pain     Social History:  Social History   Social History  . Marital Status: Married    Spouse Name: N/A  . Number of Children: 4  . Years of Education: 12th   Occupational History  . retired    Social History Main Topics  . Smoking status: Former Smoker -- 3.00 packs/day for 15 years    Types: Cigarettes    Quit date: 02/06/1964  . Smokeless tobacco: Not on file  . Alcohol Use: No  . Drug Use: No  . Sexual Activity: Not Currently   Other Topics Concern  . Not on file   Social History Narrative   Patient lives at home with his wife.    Medications:   Current Outpatient Prescriptions on File Prior to Visit  Medication Sig Dispense Refill  . cetirizine (ZYRTEC) 10 MG tablet Take 10 mg by mouth daily.    . ciprofloxacin (CIPRO) 250 MG tablet Take 250 mg by mouth 2 (two) times daily.    . fluticasone (FLONASE) 50 MCG/ACT nasal spray Place 1 spray into both nostrils 2 (two) times daily.     [name] acetaminophen (TYLENOL) 650 MG CR tablet Take 650 mg by mouth every 4 (four) hours as needed for fever.    [name] albuterol (PROVENTIL) (2.5 MG/3ML) 0.083% nebulizer solution Take 2.5 mg by nebulization every 6 (six) hours as needed for wheezing or shortness of breath.    [name] aspirin 81 MG tablet Take 81 mg by mouth daily.    [name] atorvastatin (LIPITOR) 20 MG tablet Take 1 tablet (20 mg total) by mouth daily. (Patient not taking: Reported on 09/24/2014) 30 tablet 6  . bisacodyl (DULCOLAX) 5 MG EC tablet Take 1 tablet (5 mg total) by mouth daily as needed for moderate constipation. (Patient not taking: Reported on 09/24/2014) 30 tablet 0  . clonazePAM (KLONOPIN) 0.5 MG tablet Take 0.5 mg by mouth 3 (three) times daily as needed for anxiety (Agitation, sleep).     . diazepam (VALIUM) 5 MG tablet Take 0.5-1 tablets (2.5-5 mg total) by mouth every 6 (six) hours as needed  for muscle spasms or sedation. (Patient not taking: Reported on 09/24/2014) 30 tablet 1  . divalproex (DEPAKOTE SPRINKLE) 125 MG capsule Take 125 mg by mouth 2 (two) times daily.    [name] docusate sodium (COLACE) 100 MG capsule Take 1 capsule (100 mg total) by mouth 2 (two) times daily. (Patient not taking: Reported on 09/24/2014) 10 capsule 0  . famotidine (PEPCID) 20 MG tablet Take 1 tablet (20 mg total) by mouth 2 (two) times daily. (Patient not taking: Reported on 09/24/2014) 180 tablet 0  . finasteride (PROSCAR) 5 MG tablet Take 1 tablet (5 mg total) by mouth daily. (Patient not taking: Reported on 09/24/2014) 30 tablet 0  . Methylfol-Methylcob-Acetylcyst (CEREFOLIN NAC) 6-2-600 MG TABS Take one tablet by mouth one time daily (Patient not taking: Reported  on 09/24/2014) 30 each 0  . omeprazole (PRILOSEC) 20 MG capsule Take 1 capsule (20 mg total) by mouth daily. (Patient not taking: Reported on 09/24/2014) 90 capsule 0  . oxyCODONE-acetaminophen (PERCOCET) 5-325 MG per tablet Take 1-2 tablets by mouth every 4 (four) hours as needed for severe pain (take for severe pain not controlled with tramadol). (Patient not taking: Reported on 09/24/2014) 20 tablet 0  . phenazopyridine (PYRIDIUM) 200 MG tablet Take 200 mg by mouth 3 (three) times daily as needed for pain.    . polyethylene glycol (MIRALAX / GLYCOLAX) packet Take 17 g by mouth daily as needed for mild constipation. (Patient not taking: Reported on 09/24/2014) 14 each 0  . saccharomyces boulardii (FLORASTOR) 250 MG capsule Take 250 mg by mouth 2 (two) times daily.    . tamsulosin (FLOMAX) 0.4 MG CAPS capsule Take 1 capsule (0.4 mg total) by mouth daily. (Patient not taking: Reported on 09/24/2014) 30 capsule 0  . traMADol (ULTRAM) 50 MG tablet Take 1 tablet (50 mg total) by mouth every 6 (six) hours as needed (mild to moderate pain). (Patient not taking: Reported on 09/24/2014) 50 tablet 1  . traZODone (DESYREL) 50 MG tablet Take 50 mg by mouth at bedtime as  needed for sleep.    . valsartan (DIOVAN) 160 MG tablet Take 1 tablet (160 mg total) by mouth daily. (Patient not taking: Reported on 09/24/2014) 90 tablet 0   No current facility-administered medications on file prior to visit.    Allergies:  No Known Allergies  Physical Exam General: well developed, well nourished, seated, in no evident distress Head: head normocephalic and atraumatic. Orohparynx benign Neck: supple with no carotid or supraclavicular bruits Cardiovascular: regular rate and rhythm, no murmurs Musculoskeletal: no deformity Skin:  no rash/petichiae Vascular:  Normal pulses all extremities Filed Vitals:   09/24/14 1056  BP: 138/89  Pulse: 73    Neurologic Exam Mental Status: Awake and fully alert. Oriented to place and time. Recent and remote memory intact. Attention span, concentration and fund of knowledge appropriate. Mood and affect appropriate. MMSE 27/30 with deficits in  Orientation, calculation and recall only. Animal fluency tests he scored 9.   Cranial Nerves: Fundoscopic exam not done  . Pupils equal, briskly reactive to light. Extraocular movements full without nystagmus. Visual fields full to confrontation. Hearing intact. Facial sensation intact. Face, tongue, palate moves normally and symmetrically.  Motor: Normal bulk and tone. Normal strength in all tested extremity muscles. Right shoulder hip elevation limited due to pain. Sensory.: intact to tough and pinprick and vibratory.  Coordination: Rapid alternating movements normal in all extremities. Finger-to-nose and heel-to-shin performed accurately bilaterally. Gait and Station: Arises from chair without difficulty. Stance is normal. Gait demonstrates normal stride length and balance . Not able to heel, toe and tandem walk without difficulty.  Reflexes: 1+ and symmetric. Toes downgoing.      ASSESSMENT: 81 year three-month history of intermittent confusion and behavioral changes likely due to mild  cognitive impairment. Which appears stable  PLAN:  I had a long discussion with the patient and his wife regarding his mild cognitive impairment which appears stable. Continue Cerefolin daily as well as increased perspiration in mentally challenging activities like solving crossword puzzles, sudoku and playing bridge. Patient may also perhaps consider possible participation in the CREAD study if interested. Greater than 50% of this 25 minute visit time was spent on counseling and coordination of care. Return for follow-up in 6 months or call earlier if necessary.  Delia Heady, MD Note: This document was prepared with digital dictation and possible smart phrase technology. Any transcriptional errors that result from this process are unintentional.

## 2014-09-30 ENCOUNTER — Telehealth: Payer: Self-pay

## 2014-09-30 NOTE — Telephone Encounter (Signed)
I left a message for the patient to return my call.

## 2014-10-01 ENCOUNTER — Telehealth: Payer: Self-pay

## 2014-10-01 NOTE — Telephone Encounter (Signed)
I spoke to the patient and to the patient's spouse about the CREAD study. Both of them are very interested in the study, and they agreed to schedule an appointment for the Free and Cued Selective Reminding Test (FCSRT) on [id number] at 14:00h.

## 2014-10-12 ENCOUNTER — Encounter (INDEPENDENT_AMBULATORY_CARE_PROVIDER_SITE_OTHER): Payer: Self-pay

## 2014-10-12 DIAGNOSIS — Z0289 Encounter for other administrative examinations: Secondary | ICD-10-CM

## 2014-11-26 DIAGNOSIS — R05 Cough: Secondary | ICD-10-CM | POA: Diagnosis not present

## 2014-11-26 DIAGNOSIS — Z471 Aftercare following joint replacement surgery: Secondary | ICD-10-CM | POA: Diagnosis not present

## 2014-11-26 DIAGNOSIS — J309 Allergic rhinitis, unspecified: Secondary | ICD-10-CM | POA: Diagnosis not present

## 2014-11-26 DIAGNOSIS — Z96611 Presence of right artificial shoulder joint: Secondary | ICD-10-CM | POA: Diagnosis not present

## 2014-11-26 DIAGNOSIS — H6120 Impacted cerumen, unspecified ear: Secondary | ICD-10-CM | POA: Diagnosis not present

## 2014-12-28 ENCOUNTER — Other Ambulatory Visit: Payer: Self-pay | Admitting: Internal Medicine

## 2015-02-01 DIAGNOSIS — W108XXA Fall (on) (from) other stairs and steps, initial encounter: Secondary | ICD-10-CM | POA: Diagnosis not present

## 2015-02-01 DIAGNOSIS — W19XXXA Unspecified fall, initial encounter: Secondary | ICD-10-CM | POA: Diagnosis not present

## 2015-02-01 DIAGNOSIS — S42115A Nondisplaced fracture of body of scapula, left shoulder, initial encounter for closed fracture: Secondary | ICD-10-CM | POA: Diagnosis not present

## 2015-02-01 DIAGNOSIS — M25519 Pain in unspecified shoulder: Secondary | ICD-10-CM | POA: Diagnosis not present

## 2015-02-01 DIAGNOSIS — I2510 Atherosclerotic heart disease of native coronary artery without angina pectoris: Secondary | ICD-10-CM | POA: Diagnosis not present

## 2015-02-01 DIAGNOSIS — R079 Chest pain, unspecified: Secondary | ICD-10-CM | POA: Diagnosis not present

## 2015-02-01 DIAGNOSIS — S42112A Displaced fracture of body of scapula, left shoulder, initial encounter for closed fracture: Secondary | ICD-10-CM | POA: Diagnosis not present

## 2015-02-01 DIAGNOSIS — S065X0A Traumatic subdural hemorrhage without loss of consciousness, initial encounter: Secondary | ICD-10-CM | POA: Diagnosis not present

## 2015-02-01 DIAGNOSIS — I6201 Nontraumatic acute subdural hemorrhage: Secondary | ICD-10-CM | POA: Diagnosis not present

## 2015-02-01 DIAGNOSIS — S0990XA Unspecified injury of head, initial encounter: Secondary | ICD-10-CM | POA: Diagnosis not present

## 2015-02-01 DIAGNOSIS — S42102A Fracture of unspecified part of scapula, left shoulder, initial encounter for closed fracture: Secondary | ICD-10-CM | POA: Diagnosis not present

## 2015-02-01 DIAGNOSIS — S42113A Displaced fracture of body of scapula, unspecified shoulder, initial encounter for closed fracture: Secondary | ICD-10-CM | POA: Diagnosis not present

## 2015-02-01 DIAGNOSIS — S199XXA Unspecified injury of neck, initial encounter: Secondary | ICD-10-CM | POA: Diagnosis not present

## 2015-02-01 DIAGNOSIS — I629 Nontraumatic intracranial hemorrhage, unspecified: Secondary | ICD-10-CM | POA: Diagnosis not present

## 2015-02-15 DIAGNOSIS — R413 Other amnesia: Secondary | ICD-10-CM | POA: Diagnosis not present

## 2015-02-15 DIAGNOSIS — S42102A Fracture of unspecified part of scapula, left shoulder, initial encounter for closed fracture: Secondary | ICD-10-CM | POA: Diagnosis not present

## 2015-02-15 DIAGNOSIS — S0990XA Unspecified injury of head, initial encounter: Secondary | ICD-10-CM | POA: Diagnosis not present

## 2015-02-15 DIAGNOSIS — M25551 Pain in right hip: Secondary | ICD-10-CM | POA: Diagnosis not present

## 2015-02-15 DIAGNOSIS — R296 Repeated falls: Secondary | ICD-10-CM | POA: Diagnosis not present

## 2015-02-17 DIAGNOSIS — M25551 Pain in right hip: Secondary | ICD-10-CM | POA: Diagnosis not present

## 2015-02-17 DIAGNOSIS — S42102A Fracture of unspecified part of scapula, left shoulder, initial encounter for closed fracture: Secondary | ICD-10-CM | POA: Diagnosis not present

## 2015-02-21 DIAGNOSIS — E559 Vitamin D deficiency, unspecified: Secondary | ICD-10-CM | POA: Diagnosis not present

## 2015-02-21 DIAGNOSIS — E039 Hypothyroidism, unspecified: Secondary | ICD-10-CM | POA: Diagnosis not present

## 2015-02-21 DIAGNOSIS — I1310 Hypertensive heart and chronic kidney disease without heart failure, with stage 1 through stage 4 chronic kidney disease, or unspecified chronic kidney disease: Secondary | ICD-10-CM | POA: Diagnosis not present

## 2015-02-21 DIAGNOSIS — E785 Hyperlipidemia, unspecified: Secondary | ICD-10-CM | POA: Diagnosis not present

## 2015-02-21 DIAGNOSIS — N390 Urinary tract infection, site not specified: Secondary | ICD-10-CM | POA: Diagnosis not present

## 2015-02-21 DIAGNOSIS — R7309 Other abnormal glucose: Secondary | ICD-10-CM | POA: Diagnosis not present

## 2015-03-07 ENCOUNTER — Ambulatory Visit (INDEPENDENT_AMBULATORY_CARE_PROVIDER_SITE_OTHER): Payer: Commercial Managed Care - HMO | Admitting: Neurology

## 2015-03-07 ENCOUNTER — Encounter (INDEPENDENT_AMBULATORY_CARE_PROVIDER_SITE_OTHER): Payer: Self-pay

## 2015-03-07 ENCOUNTER — Encounter: Payer: Self-pay | Admitting: Neurology

## 2015-03-07 ENCOUNTER — Institutional Professional Consult (permissible substitution): Payer: Medicare HMO | Admitting: Neurology

## 2015-03-07 VITALS — BP 133/90 | HR 83 | Ht 68.0 in | Wt 165.2 lb

## 2015-03-07 DIAGNOSIS — F0390 Unspecified dementia without behavioral disturbance: Secondary | ICD-10-CM

## 2015-03-07 DIAGNOSIS — W108XXS Fall (on) (from) other stairs and steps, sequela: Secondary | ICD-10-CM | POA: Insufficient documentation

## 2015-03-07 MED ORDER — DONEPEZIL HCL 5 MG PO TABS: 5.0000 mg | ORAL_TABLET | Freq: Every day | ORAL | Status: DC

## 2015-03-07 NOTE — Patient Instructions (Signed)
I had a long discussion with the patient and wife regarding his progressive memory loss and cognitive impairment which has now progressed to mild dementia. I recommend trial of Aricept as previously evaluation for treatable causes has been negative. Recommend repeat CT scan of the head to look for resolution of his posttraumatic subdural. I also advised patient fall and safety precautions. He was advised to return for follow-up to see me in 2 months or call earlier if necessary.  Fall Prevention in the Home  Falls can cause injuries. They can happen to people of all ages. There are many things you can do to make your home safe and to help prevent falls.  WHAT CAN I DO ON THE OUTSIDE OF MY HOME?  Regularly fix the edges of walkways and driveways and fix any cracks.  Remove anything that might make you trip as you walk through a door, such as a raised step or threshold.  Trim any bushes or trees on the path to your home.  Use bright outdoor lighting.  Clear any walking paths of anything that might make someone trip, such as rocks or tools.  Regularly check to see if handrails are loose or broken. Make sure that both sides of any steps have handrails.  Any raised decks and porches should have guardrails on the edges.  Have any leaves, snow, or ice cleared regularly.  Use sand or salt on walking paths during winter.  Clean up any spills in your garage right away. This includes oil or grease spills. WHAT CAN I DO IN THE BATHROOM?   Use night lights.  Install grab bars by the toilet and in the tub and shower. Do not use towel bars as grab bars.  Use non-skid mats or decals in the tub or shower.  If you need to sit down in the shower, use a plastic, non-slip stool.  Keep the floor dry. Clean up any water that spills on the floor as soon as it happens.  Remove soap buildup in the tub or shower regularly.  Attach bath mats securely with double-sided non-slip rug tape.  Do not have  throw rugs and other things on the floor that can make you trip. WHAT CAN I DO IN THE BEDROOM?  Use night lights.  Make sure that you have a light by your bed that is easy to reach.  Do not use any sheets or blankets that are too big for your bed. They should not hang down onto the floor.  Have a firm chair that has side arms. You can use this for support while you get dressed.  Do not have throw rugs and other things on the floor that can make you trip. WHAT CAN I DO IN THE KITCHEN?  Clean up any spills right away.  Avoid walking on wet floors.  Keep items that you use a lot in easy-to-reach places.  If you need to reach something above you, use a strong step stool that has a grab bar.  Keep electrical cords out of the way.  Do not use floor polish or wax that makes floors slippery. If you must use wax, use non-skid floor wax.  Do not have throw rugs and other things on the floor that can make you trip. WHAT CAN I DO WITH MY STAIRS?  Do not leave any items on the stairs.  Make sure that there are handrails on both sides of the stairs and use them. Fix handrails that are broken or loose.  Make sure that handrails are as long as the stairways.  Check any carpeting to make sure that it is firmly attached to the stairs. Fix any carpet that is loose or worn.  Avoid having throw rugs at the top or bottom of the stairs. If you do have throw rugs, attach them to the floor with carpet tape.  Make sure that you have a light switch at the top of the stairs and the bottom of the stairs. If you do not have them, ask someone to add them for you. WHAT ELSE CAN I DO TO HELP PREVENT FALLS?  Wear shoes that:  Do not have high heels.  Have rubber bottoms.  Are comfortable and fit you well.  Are closed at the toe. Do not wear sandals.  If you use a stepladder:  Make sure that it is fully opened. Do not climb a closed stepladder.  Make sure that both sides of the stepladder are  locked into place.  Ask someone to hold it for you, if possible.  Clearly mark and make sure that you can see:  Any grab bars or handrails.  First and last steps.  Where the edge of each step is.  Use tools that help you move around (mobility aids) if they are needed. These include:  Canes.  Walkers.  Scooters.  Crutches.  Turn on the lights when you go into a dark area. Replace any light bulbs as soon as they burn out.  Set up your furniture so you have a clear path. Avoid moving your furniture around.  If any of your floors are uneven, fix them.  If there are any pets around you, be aware of where they are.  Review your medicines with your doctor. Some medicines can make you feel dizzy. This can increase your chance of falling. Ask your doctor what other things that you can do to help prevent falls.   This information is not intended to replace advice given to you by your health care provider. Make sure you discuss any questions you have with your health care provider.   Document Released: 11/18/2008 Document Revised: 06/08/2014 Document Reviewed: 02/26/2014 Elsevier Interactive Patient Education 2016 Elsevier Inc[employer] Dementia Dementia is a general term for problems with brain function. A person with dementia has memory loss and a hard time with at least one other brain function such as thinking, speaking, or problem solving. Dementia can affect social functioning, how you do your job, your mood, or your personality. The changes may be hidden for a long time. The earliest forms of this disease are usually not detected by family or friends. Dementia can be:  Irreversible.  Potentially reversible.  Partially reversible.  Progressive. This means it can get worse over time. CAUSES  Irreversible dementia causes may include:  Degeneration of brain cells (Alzheimer disease or Lewy body dementia).  Multiple small strokes (vascular dementia).  Infection (chronic  meningitis or Creutzfeldt-Jakob disease).  Frontotemporal dementia. This affects younger people, age 43 to 24, compared to those who have Alzheimer disease.  Dementia associated with other disorders like Parkinson disease, Huntington disease, or HIV-associated dementia. Potentially or partially reversible dementia causes may include:  Medicines.  Metabolic causes such as excessive alcohol intake, vitamin B12 deficiency, or thyroid disease.  Masses or pressure in the brain such as a tumor, blood clot, or hydrocephalus. SIGNS AND SYMPTOMS  Symptoms are often hard to detect. Family members or coworkers may not notice them early in the disease process. Different people  with dementia may have different symptoms. Symptoms can include:  A hard time with memory, especially recent memory. Long-term memory may not be impaired.  Asking the same question multiple times or forgetting something someone just said.  A hard time speaking your thoughts or finding certain words.  A hard time solving problems or performing familiar tasks (such as how to use a telephone).  Sudden changes in mood.  Changes in personality, especially increasing moodiness or mistrust.  Depression.  A hard time understanding complex ideas that were never a problem in the past. DIAGNOSIS  There are no specific tests for dementia.   Your health care provider may recommend a thorough evaluation. This is because some forms of dementia can be reversible. The evaluation will likely include a physical exam and getting a detailed history from you and a family member. The history often gives the best clues and suggestions for a diagnosis.  Memory testing may be done. A detailed brain function evaluation called neuropsychologic testing may be helpful.  Lab tests and brain imaging (such as a CT scan or MRI scan) are sometimes important.  Sometimes observation and re-evaluation over time is very helpful. TREATMENT  Treatment  depends on the cause.   If the problem is a vitamin deficiency, it may be helped or cured with supplements.  For dementias such as Alzheimer disease, medicines are available to stabilize or slow the course of the disease. There are no cures for this type of dementia.  Your health care provider can help direct you to groups, organizations, and other health care providers to help with decisions in the care of you or your loved one. HOME CARE INSTRUCTIONS The care of individuals with dementia is varied and dependent upon the progression of the dementia. The following suggestions are intended for the person living with, or caring for, the person with dementia.  Create a safe environment.  Remove the locks on bathroom doors to prevent the person from accidentally locking himself or herself in.  Use childproof latches on kitchen cabinets and any place where cleaning supplies, chemicals, or alcohol are kept.  Use childproof covers in unused electrical outlets.  Install childproof devices to keep doors and windows secured.  Remove stove knobs or install safety knobs and an automatic shut-off on the stove.  Lower the temperature on water heaters.  Label medicines and keep them locked up.  Secure knives, lighters, matches, power tools, and guns, and keep these items out of reach.  Keep the house free from clutter. Remove rugs or anything that might contribute to a fall.  Remove objects that might break and hurt the person.  Make sure lighting is good, both inside and outside.  Install grab rails as needed.  Use a monitoring device to alert you to falls or other needs for help.  Reduce confusion.  Keep familiar objects and people around.  Use night lights or dim lights at night.  Label items or areas.  Use reminders, notes, or directions for daily activities or tasks.  Keep a simple, consistent routine for waking, meals, bathing, dressing, and bedtime.  Create a calm, quiet  environment.  Place large clocks and calendars prominently.  Display emergency numbers and home address near all telephones.  Use cues to establish different times of the day. An example is to open curtains to let the natural light in during the day.   Use effective communication.  Choose simple words and short sentences.  Use a gentle, calm tone of voice.  Be careful not to interrupt.  If the person is struggling to find a word or communicate a thought, try to provide the word or thought.  Ask one question at a time. Allow the person ample time to answer questions. Repeat the question again if the person does not respond.  Reduce nighttime restlessness.  Provide a comfortable bed.  Have a consistent nighttime routine.  Ensure a regular walking or physical activity schedule. Involve the person in daily activities as much as possible.  Limit napping during the day.  Limit caffeine.  Attend social events that stimulate rather than overwhelm the senses.  Encourage good nutrition and hydration.  Reduce distractions during meal times and snacks.  Avoid foods that are too hot or too cold.  Monitor chewing and swallowing ability.  Continue with routine vision, hearing, dental, and medical screenings.  Give medicines only as directed by the health care provider.  Monitor driving abilities. Do not allow the person to drive when safe driving is no longer possible.  Register with an identification program which could provide location assistance in the event of a missing person situation. SEEK MEDICAL CARE IF:   New behavioral problems start such as moodiness, aggressiveness, or seeing things that are not there (hallucinations).  Any new problem with brain function happens. This includes problems with balance, speech, or falling a lot.  Problems with swallowing develop.  Any symptoms of other illness happen. Small changes or worsening in any aspect of brain function can  be a sign that the illness is getting worse. It can also be a sign of another medical illness such as infection. Seeing a health care provider right away is important. SEEK IMMEDIATE MEDICAL CARE IF:   A fever develops.  New or worsened confusion develops.  New or worsened sleepiness develops.  Staying awake becomes hard to do.   This information is not intended to replace advice given to you by your health care provider. Make sure you discuss any questions you have with your health care provider.   Document Released: 07/18/2000 Document Revised: 02/12/2014 Document Reviewed: 06/19/2010 Elsevier Interactive Patient Education [employer]

## 2015-03-07 NOTE — Progress Notes (Signed)
[facility] [address] [place]. [place] [address] [phone]       OFFICE CONSULT NOTE  [name] Date of Birth:  1931-12-05 Medical Record Number:  [id number]   Referring MD:  Shary Decamp  Reason for Referral:  Memory loss and fall  HPI: [name] is a 82 year old Caucasian male who is a complaint by his wife who provides most of the history. The patient was visiting family in [place] in December last year when he had a false down the stairs from the fifth floor and fractured his scapula and had I suspect a traumatic subarachnoid or subdural hemorrhage. I do not have the records from that hospitalization though he was admitted to [facility]. The patient has noted significant cognitive decline following this fall. The wife states that he has occasional confusion with good days and bad days as well as some agitation and delusions. The patient himself denies this. The patient in fact had seen me in February 2015 for memory loss and at that time the Mini-Mental status exam score documented as 25/30 and height felt he had mild cognitive impairment. He has been taking Cerefolin since then and fish oil. He had MRI scan of the brain done in February 2015 which was unremarkable. Lab work for treatable causes of cognitive impairment including vitamin B12, TSH, RPR and 1 all normal. Homocystine was mildly elevated. Patient has not had any stroke symptoms, seizures, loss of consciousness. The patient remains quite independent in activities of daily living. Does have some occasional delusions and some agitation but can be redirected. He has not been tried on Aricept or any other dementia medications.  ROS:   14 system review of systems is positive for activity change, appetite change, cough, frequent waking, daytime sleepiness, cold intolerance, back pain, memory loss, speech difficulty, weakness, confusion, decreased concentration, depression, anxiety,  nervousness  PMH:  Past Medical History  Diagnosis Date  . Heart attack (HCC)   . Depression with anxiety     takes Clonazepam nightly  . RLS (restless legs syndrome)   . Unspecified vitamin D deficiency   . Vitamin B 12 deficiency   . Allergic rhinitis   . Coronary artery disease     s/p remote IWMI 2004 with PCI of the RCA with BMS and repeat cath 2007 due to recurrent CP and abnormal nuclear stress test and cath showed widely patent RCA stent with 30-50% distal stenosis before the takeoff of a PL and PDA and normal LVF.   [name] Hypertension     takes Diovan daily  . Constipation     takes Miralax daily as needed  . GERD (gastroesophageal reflux disease)     takes Omeprazole and Pepcid daily  . Hyperlipidemia     takes Atorvastatin daily  . History of bronchitis     pulmonologist is Dr.Wert.  . Arthritis   . Joint pain     Social History:  Social History   Social History  . Marital Status: Married    Spouse Name: N/A  . Number of Children: 4  . Years of Education: 12th   Occupational History  . retired    Social History Main Topics  . Smoking status: Former Smoker -- 3.00 packs/day for 15 years    Types: Cigarettes    Quit date: 02/06/1964  . Smokeless tobacco: Not on file  . Alcohol Use: No  . Drug Use: No  . Sexual Activity: Not Currently   Other Topics Concern  .  Not on file   Social History Narrative   Patient lives at home with his wife.    Medications:   Current Outpatient Prescriptions on File Prior to Visit  Medication Sig Dispense Refill  . aspirin 81 MG tablet Take 81 mg by mouth daily.    . cetirizine (ZYRTEC) 10 MG tablet Take 10 mg by mouth daily.    . clonazePAM (KLONOPIN) 0.5 MG tablet Take 0.5 mg by mouth 3 (three) times daily as needed for anxiety (Agitation, sleep).     . diazepam (VALIUM) 5 MG tablet Take 0.5-1 tablets (2.5-5 mg total) by mouth every 6 (six) hours as needed for muscle spasms or sedation. 30 tablet 1  . fluticasone  (FLONASE) 50 MCG/ACT nasal spray Place 1 spray into both nostrils 2 (two) times daily. Reported on 03/07/2015    . L-Methylfolate-B12-B6-B2 (CEREFOLIN) 07-06-48-5 MG TABS Take by mouth.    . traMADol (ULTRAM) 50 MG tablet Take 1 tablet (50 mg total) by mouth every 6 (six) hours as needed (mild to moderate pain). 50 tablet 1   No current facility-administered medications on file prior to visit.    Allergies:  No Known Allergies  Physical Exam General: well developed, well nourished elderly Caucasian male, seated, in no evident distress Head: head normocephalic and atraumatic.   Neck: supple with no carotid or supraclavicular bruits Cardiovascular: regular rate and rhythm, no murmurs Musculoskeletal: no deformity Skin:  no rash/petichiae Vascular:  Normal pulses all extremities  Neurologic Exam Mental Status: Awake and fully alert. Oriented to place and time. Recent and remote memory diminished. Attention span, concentration and fund of knowledge poore. Mood and affect appropriate. Mini-Mental status exam score 17/30. Animal naming test 7 only. Clock drawing 1/4. Cranial Nerves: Fundoscopic exam reveals sharp disc margins. Pupils equal, briskly reactive to light. Extraocular movements full without nystagmus. Visual fields full to confrontation. Hearing intact. Facial sensation intact. Face, tongue, palate moves normally and symmetrically.  Motor: Normal bulk and tone. Normal strength in all tested extremity muscles. Sensory.: intact to touch , pinprick , position and vibratory sensation.  Coordination: Rapid alternating movements normal in all extremities. Finger-to-nose and heel-to-shin performed accurately bilaterally. Gait and Station: Arises from chair without difficulty. Stance is normal. Gait demonstrates normal stride length and balance . Unable to heel, toe and tandem walk without difficulty.  Reflexes: 1+ and symmetric. Toes downgoing.       ASSESSMENT: 82 year old gentleman with  long-standing mild cognitive impairment which now seems to have progressed to mild dementia. Recent history of traumatic subdural hemorrhage in December 2016.    PLAN: I had a long discussion with the patient and wife regarding his progressive memory loss and cognitive impairment which has now progressed to mild dementia. I recommend trial of Aricept as previously evaluation for treatable causes has been negative. Recommend repeat CT scan of the head to look for resolution of his posttraumatic subdural. We will send for medical records from admission in [place] following his fall. I also advised patient fall and safety precautions. He was advised to return for follow-up to see me in 2 months or call earlier if necessary. Delia Heady, MD Note: This document was prepared with digital dictation and possible smart phrase technology. Any transcriptional errors that result from this process are unintentional.

## 2015-03-10 ENCOUNTER — Telehealth: Payer: Self-pay

## 2015-03-10 ENCOUNTER — Encounter: Payer: Self-pay | Admitting: *Deleted

## 2015-03-10 NOTE — Telephone Encounter (Signed)
If patients wife call back I need the name of the hospital her husband was at in December 2016, when he fell. I have to give it to medical records. Lft vm for patient.

## 2015-03-11 NOTE — Telephone Encounter (Signed)
LFt vm for patients wife about needing the name of the hospital he was at in [place].

## 2015-03-14 NOTE — Telephone Encounter (Signed)
Wife [name] called back to advise name of hospital is Teacher, [facility] in [place] [phone].

## 2015-03-16 ENCOUNTER — Other Ambulatory Visit: Payer: Self-pay

## 2015-03-22 ENCOUNTER — Ambulatory Visit
Admission: RE | Admit: 2015-03-22 | Discharge: 2015-03-22 | Disposition: A | Discharge status: Home / Self Care | Payer: Commercial Managed Care - HMO | Source: Ambulatory Visit | Attending: Neurology | Admitting: Neurology

## 2015-03-22 ENCOUNTER — Other Ambulatory Visit: Payer: Self-pay

## 2015-03-22 DIAGNOSIS — W108XXS Fall (on) (from) other stairs and steps, sequela: Secondary | ICD-10-CM

## 2015-03-22 DIAGNOSIS — F0390 Unspecified dementia without behavioral disturbance: Secondary | ICD-10-CM

## 2015-03-28 DIAGNOSIS — J309 Allergic rhinitis, unspecified: Secondary | ICD-10-CM | POA: Diagnosis not present

## 2015-03-28 DIAGNOSIS — G4700 Insomnia, unspecified: Secondary | ICD-10-CM | POA: Diagnosis not present

## 2015-04-04 ENCOUNTER — Ambulatory Visit: Payer: Medicare HMO | Admitting: Neurology

## 2015-04-25 DIAGNOSIS — I129 Hypertensive chronic kidney disease with stage 1 through stage 4 chronic kidney disease, or unspecified chronic kidney disease: Secondary | ICD-10-CM | POA: Diagnosis not present

## 2015-04-25 DIAGNOSIS — F419 Anxiety disorder, unspecified: Secondary | ICD-10-CM | POA: Diagnosis not present

## 2015-04-25 DIAGNOSIS — J441 Chronic obstructive pulmonary disease with (acute) exacerbation: Secondary | ICD-10-CM | POA: Diagnosis not present

## 2015-04-25 DIAGNOSIS — J45909 Unspecified asthma, uncomplicated: Secondary | ICD-10-CM | POA: Diagnosis not present

## 2015-04-25 DIAGNOSIS — E538 Deficiency of other specified B group vitamins: Secondary | ICD-10-CM | POA: Diagnosis not present

## 2015-04-25 DIAGNOSIS — I10 Essential (primary) hypertension: Secondary | ICD-10-CM | POA: Diagnosis not present

## 2015-04-25 DIAGNOSIS — N390 Urinary tract infection, site not specified: Secondary | ICD-10-CM | POA: Diagnosis not present

## 2015-04-25 DIAGNOSIS — R7309 Other abnormal glucose: Secondary | ICD-10-CM | POA: Diagnosis not present

## 2015-04-25 DIAGNOSIS — F329 Major depressive disorder, single episode, unspecified: Secondary | ICD-10-CM | POA: Diagnosis not present

## 2015-04-25 DIAGNOSIS — Z125 Encounter for screening for malignant neoplasm of prostate: Secondary | ICD-10-CM | POA: Diagnosis not present

## 2015-04-25 DIAGNOSIS — E559 Vitamin D deficiency, unspecified: Secondary | ICD-10-CM | POA: Diagnosis not present

## 2015-04-28 ENCOUNTER — Other Ambulatory Visit: Payer: Self-pay | Admitting: Neurology

## 2015-05-13 DIAGNOSIS — I2510 Atherosclerotic heart disease of native coronary artery without angina pectoris: Secondary | ICD-10-CM | POA: Diagnosis not present

## 2015-05-13 DIAGNOSIS — J449 Chronic obstructive pulmonary disease, unspecified: Secondary | ICD-10-CM | POA: Diagnosis not present

## 2015-05-13 DIAGNOSIS — Z23 Encounter for immunization: Secondary | ICD-10-CM | POA: Diagnosis not present

## 2015-05-13 DIAGNOSIS — I700 Atherosclerosis of aorta: Secondary | ICD-10-CM | POA: Diagnosis not present

## 2015-05-13 DIAGNOSIS — I129 Hypertensive chronic kidney disease with stage 1 through stage 4 chronic kidney disease, or unspecified chronic kidney disease: Secondary | ICD-10-CM | POA: Diagnosis not present

## 2015-05-13 DIAGNOSIS — Z0001 Encounter for general adult medical examination with abnormal findings: Secondary | ICD-10-CM | POA: Diagnosis not present

## 2015-05-13 DIAGNOSIS — Z1389 Encounter for screening for other disorder: Secondary | ICD-10-CM | POA: Diagnosis not present

## 2015-05-19 ENCOUNTER — Ambulatory Visit: Payer: Commercial Managed Care - HMO | Admitting: Neurology

## 2015-05-26 DIAGNOSIS — R05 Cough: Secondary | ICD-10-CM | POA: Diagnosis not present

## 2015-05-26 DIAGNOSIS — J069 Acute upper respiratory infection, unspecified: Secondary | ICD-10-CM | POA: Diagnosis not present

## 2015-05-26 DIAGNOSIS — M25512 Pain in left shoulder: Secondary | ICD-10-CM | POA: Diagnosis not present

## 2015-05-26 DIAGNOSIS — R3989 Other symptoms and signs involving the genitourinary system: Secondary | ICD-10-CM | POA: Diagnosis not present

## 2015-05-26 DIAGNOSIS — J309 Allergic rhinitis, unspecified: Secondary | ICD-10-CM | POA: Diagnosis not present

## 2015-06-02 DIAGNOSIS — J40 Bronchitis, not specified as acute or chronic: Secondary | ICD-10-CM | POA: Diagnosis not present

## 2015-06-10 DIAGNOSIS — J40 Bronchitis, not specified as acute or chronic: Secondary | ICD-10-CM | POA: Diagnosis not present

## 2015-06-10 DIAGNOSIS — J449 Chronic obstructive pulmonary disease, unspecified: Secondary | ICD-10-CM | POA: Diagnosis not present

## 2015-06-24 DIAGNOSIS — J449 Chronic obstructive pulmonary disease, unspecified: Secondary | ICD-10-CM | POA: Diagnosis not present

## 2015-07-05 ENCOUNTER — Telehealth: Payer: Self-pay | Admitting: Neurology

## 2015-07-05 NOTE — Telephone Encounter (Signed)
Message For: OFC                  Taken 30-MAY-17 at 11:22AM by [facility] ------------------------------------------------------------  Caller  Chevis PrettyJOY Yabut  WIFE            CID  [id number]   Patient  [name]          Pt's Dr  Pearlean BrownieSETHI         Area Code  336  Phone#  [phone] *  DOB  [birth year]      RE  VERIFY APPT TIME/DAY, ISNT SURE WHEN IT IS                                                              Disp:Y/N  N  If Y = C/B If No Response In 20minutes  ============================================================  LVM about pt's appt date and time. Said to call back if there were questions

## 2015-07-27 DIAGNOSIS — J441 Chronic obstructive pulmonary disease with (acute) exacerbation: Secondary | ICD-10-CM | POA: Diagnosis not present

## 2015-07-27 DIAGNOSIS — J189 Pneumonia, unspecified organism: Secondary | ICD-10-CM | POA: Diagnosis not present

## 2015-08-03 ENCOUNTER — Telehealth: Payer: Self-pay | Admitting: *Deleted

## 2015-08-03 ENCOUNTER — Other Ambulatory Visit: Payer: Self-pay | Admitting: Neurology

## 2015-08-03 NOTE — Telephone Encounter (Signed)
Pt's wife returned [name]'s, message was relayed. She was thankful

## 2015-08-03 NOTE — Telephone Encounter (Signed)
LFt vm for patients wife that pts donepezil refill was done this morning.

## 2015-08-03 NOTE — Telephone Encounter (Signed)
Pt's wife called request refill on donepezil (ARICEPT) 5 MG tablet. Please advise [phone]

## 2015-08-03 NOTE — Telephone Encounter (Signed)
LFt  vm that appt time is on July 3 at 1030 check in at 1000am.

## 2015-08-04 DIAGNOSIS — J449 Chronic obstructive pulmonary disease, unspecified: Secondary | ICD-10-CM | POA: Diagnosis not present

## 2015-08-04 DIAGNOSIS — J189 Pneumonia, unspecified organism: Secondary | ICD-10-CM | POA: Diagnosis not present

## 2015-08-08 ENCOUNTER — Encounter: Payer: Self-pay | Admitting: Neurology

## 2015-08-08 ENCOUNTER — Ambulatory Visit (INDEPENDENT_AMBULATORY_CARE_PROVIDER_SITE_OTHER): Payer: Commercial Managed Care - HMO | Admitting: Neurology

## 2015-08-08 VITALS — BP 124/83 | HR 70 | Ht 68.0 in | Wt 168.2 lb

## 2015-08-08 DIAGNOSIS — F0390 Unspecified dementia without behavioral disturbance: Secondary | ICD-10-CM | POA: Diagnosis not present

## 2015-08-08 MED ORDER — DONEPEZIL HCL 5 MG PO TABS: 10.0000 mg | ORAL_TABLET | Freq: Every day | ORAL | Status: DC

## 2015-08-08 NOTE — Addendum Note (Signed)
Addended byHermenia Fiscal: YOUNG, SANDRA on: 08/08/2015 01:27 PM   Modules accepted: Orders

## 2015-08-08 NOTE — Telephone Encounter (Signed)
[facility] [phone].    Needed clarification on what dose pt was taking at this time.  I relayed that 2 tabs (10mg  ) po bedtime.  I told her 180 tabs with 3 refills of donepezil 5mg  tabs.  She verbalized understanding.

## 2015-08-08 NOTE — Patient Instructions (Signed)
I had a long discussion with the patient and his wife regarding his mild dementia which seems to have responded well to Aricept. Continue Aricept in the current dose of 10 mg daily. There is no need to add Cefefolin NAC at the present time since he is doing so well. He was advised to do cognitively challenging activities like solving crossword puzzles, sudoku, playing bridge. He was also advised to follow risk precautions Return for follow-up in 6 months with nurse practitioner call earlier if necessary.

## 2015-08-08 NOTE — Progress Notes (Signed)
[facility] [address] [place]. [place] [id number] [phone]       OFFICE CONSULT NOTE  Mr. Douglas PentonRalph A Poirier Date of Birth:  07/30/1931 Medical Record Number:  [id number]   Referring MD:  Shary DecampBrian McKenzie  Reason for Referral:  Memory loss and fall  HPI: Initial Consult  03/07/2015 ;[name] is a 82 year old Caucasian male who is a complaint by his wife who provides most of the history. The patient was [place] in [place] in December last year when he had a false down the stairs from the fifth floor and fractured his scapula and had I suspect a traumatic subarachnoid or subdural hemorrhage. I do not have the records from that hospitalization though he was admitted to [facility]. The patient has noted significant cognitive decline following this fall. The wife states that he has occasional confusion with good days and bad days as well as some agitation and delusions. The patient himself denies this. The patient in fact had seen me in February 2015 for memory loss and at that time the Mini-Mental status exam score documented as 25/30 and height felt he had mild cognitive impairment. He has been taking Cerefolin since then and fish oil. He had MRI scan of the brain done in February 2015 which was unremarkable. Lab work for treatable causes of cognitive impairment including vitamin B12, TSH, RPR and 1 all normal. Homocystine was mildly elevated. Patient has not had any stroke symptoms, seizures, loss of consciousness. The patient remains quite independent in activities of daily living. Does have some occasional delusions and some agitation but can be redirected. He has not been tried on Aricept or any other dementia medications. Update 08/08/2015 : He returns for follow-up after last visit 6 months ago. He is accompanied by his wife. Patient has noticed significant improvement in his cognition, memory and overall functioning at home after starting the Aricept. He is  tolerating 10 mg daily quite well without dizziness, nausea or upset stomach. He has in fact not been taking sedative full in NAC as there was a misunderstanding. Patient is fairly independent in all activities of daily living. He is quite physically active and does a lot of work around the house as well as likes working out in the yard. Patient's wife plans to start a gym and take them out soon. He did have a follow-up CT scan of the head done on 03/22/15 which I have reviewed shows  mild generalized atrophy and some changes of maxillary sinusitis without any acute abnormality. The previously seen subdural has resolved completely. Patient scored 28/30 on the Mini-Mental status testing today which is significantly improved from 17/30 at last visit. Patient denies any agitation, restlessness, and delusions. He does have occasional hallucinations. His balance is good though he is occasionally off balance but he is not had any major falls. ROS:   14 system review of systems is positive for   memory loss,   confusion, decreased concentration, balance difficulty only and all other systems negative  PMH:  Past Medical History  Diagnosis Date  . Heart attack (HCC)   . Depression with anxiety     takes Clonazepam nightly  . RLS (restless legs syndrome)   . Unspecified vitamin D deficiency   . Vitamin B 12 deficiency   . Allergic rhinitis   . Coronary artery disease     s/p remote IWMI 2004 with PCI of the RCA with BMS and repeat cath 2007 due to recurrent CP and  abnormal nuclear stress test and cath showed widely patent RCA stent with 30-50% distal stenosis before the takeoff of a PL and PDA and normal LVF.   [name]. Hypertension     takes Diovan daily  . Constipation     takes Miralax daily as needed  . GERD (gastroesophageal reflux disease)     takes Omeprazole and Pepcid daily  . Hyperlipidemia     takes Atorvastatin daily  . History of bronchitis     pulmonologist is Dr.Wert.  . Arthritis   . Joint  pain     Social History:  Social History   Social History  . Marital Status: Married    Spouse Name: N/A  . Number of Children: 4  . Years of Education: 12th   Occupational History  . retired    Social History Main Topics  . Smoking status: Former Smoker -- 3.00 packs/day for 15 years    Types: Cigarettes    Quit date: 02/06/1964  . Smokeless tobacco: Not on file  . Alcohol Use: No  . Drug Use: No  . Sexual Activity: Not Currently   Other Topics Concern  . Not on file   Social History Narrative   Patient lives at home with his wife.    Medications:   Current Outpatient Prescriptions on File Prior to Visit  Medication Sig Dispense Refill  . aspirin 81 MG tablet Take 81 mg by mouth daily.    [name]. donepezil (ARICEPT) 5 MG tablet TAKE 1 TABLET EVERY DAY FOR FOUR WEEKS, THEN 10 MG (2 TABLETS) AT BEDTIME, DAILY 90 tablet 3  . fluticasone (FLONASE) 50 MCG/ACT nasal spray Place 1 spray into both nostrils 2 (two) times daily. Reported on 03/07/2015    . L-Methylfolate-B12-B6-B2 (CEREFOLIN) 07-06-48-5 MG TABS Take by mouth.     No current facility-administered medications on file prior to visit.    Allergies:  No Known Allergies  Physical Exam General: well developed, well nourished elderly Caucasian male, seated, in no evident distress Head: head normocephalic and atraumatic.   Neck: supple with no carotid or supraclavicular bruits Cardiovascular: regular rate and rhythm, no murmurs Musculoskeletal: no deformity Skin:  no rash/petichiae Vascular:  Normal pulses all extremities  Neurologic Exam Mental Status: Awake and fully alert. Oriented to place and time. Recent and remote memory diminished. Attention span, concentration and fund of knowledge poore. Mood and affect appropriate. Mini-Mental status exam score 28/30 with 1 deficit in recall and attention only.. Animal naming test 6 only. Clock drawing 1/4. Cranial Nerves: Fundoscopic exam reveals sharp disc margins. Pupils  equal, briskly reactive to light. Extraocular movements full without nystagmus. Visual fields full to confrontation. Hearing intact. Facial sensation intact. Face, tongue, palate moves normally and symmetrically.  Motor: Normal bulk and tone. Normal strength in all tested extremity muscles. Sensory.: intact to touch , pinprick , position and vibratory sensation.  Coordination: Rapid alternating movements normal in all extremities. Finger-to-nose and heel-to-shin performed accurately bilaterally. Gait and Station: Arises from chair without difficulty. Stance is normal. Gait demonstrates normal stride length and balance . Unable to heel, toe and tandem walk without difficulty.  Reflexes: 1+ and symmetric. Toes downgoing.       ASSESSMENT: 82 year old gentleman with long-standing mild cognitive impairment which now seems to have progressed to mild dementia. But shown good response to aricept. Recent history of traumatic subdural hemorrhage in December 2016.    PLAN: I had a long discussion with the patient and his wife regarding his mild dementia which  seems to have responded well to Aricept. Continue Aricept in the current dose of 10 mg daily. There is no need to add Cefefolin NAC at the present time since he is doing so well. He was advised to do cognitively challenging activities like solving crossword puzzles, sudoku, playing bridge. He was also advised to follow risk precautions Return for follow-up in 6 months with nurse practitioner call earlier if necessary. Delia Heady, MD Note: This document was prepared with digital dictation and possible smart phrase technology. Any transcriptional errors that result from this process are unintentional.

## 2015-08-10 ENCOUNTER — Telehealth: Payer: Self-pay | Admitting: Neurology

## 2015-08-10 NOTE — Telephone Encounter (Signed)
Patient's wife is calling to get prior authorization for medication donepezil (ARICEPT) 5 MG tablet#180 for the patient. Please call to [facility] at [phone].

## 2015-08-10 NOTE — Telephone Encounter (Signed)
PA REQUEST sent to [facility] on cover my request. PA can take from 3 to 5 days business. Its pending.

## 2015-08-11 NOTE — Telephone Encounter (Signed)
Rn call  [facility] at [phone] about PA authorization. Rn talk to Mariettaheryl and she stated it will get approve if patient just take 10 mg at night one pill of aricept..[name]

## 2015-08-11 NOTE — Addendum Note (Signed)
Addended by: Hildred AlaminMURRELL, KATRINA Y on: 08/11/2015 09:39 AM   Modules accepted: Medications

## 2015-08-11 NOTE — Telephone Encounter (Signed)
Rn call [phone]. PT will be getting 10mg  of aricept with no PA. Pt will only be taking 10mg  of aricept at night.

## 2015-08-11 NOTE — Telephone Encounter (Signed)
Rn call patients wife that aricept will be only 10mg  at night only. Pt will take one pill. PTs wife verbalized understanding of her husband taking one 10mg  at night.

## 2015-09-29 DIAGNOSIS — J441 Chronic obstructive pulmonary disease with (acute) exacerbation: Secondary | ICD-10-CM | POA: Diagnosis not present

## 2015-09-29 DIAGNOSIS — J329 Chronic sinusitis, unspecified: Secondary | ICD-10-CM | POA: Diagnosis not present

## 2015-10-06 DIAGNOSIS — J441 Chronic obstructive pulmonary disease with (acute) exacerbation: Secondary | ICD-10-CM | POA: Diagnosis not present

## 2015-11-23 DIAGNOSIS — J029 Acute pharyngitis, unspecified: Secondary | ICD-10-CM | POA: Diagnosis not present

## 2015-11-23 DIAGNOSIS — J329 Chronic sinusitis, unspecified: Secondary | ICD-10-CM | POA: Diagnosis not present

## 2015-12-16 DIAGNOSIS — J069 Acute upper respiratory infection, unspecified: Secondary | ICD-10-CM | POA: Diagnosis not present

## 2015-12-16 DIAGNOSIS — H109 Unspecified conjunctivitis: Secondary | ICD-10-CM | POA: Diagnosis not present

## 2015-12-16 DIAGNOSIS — R05 Cough: Secondary | ICD-10-CM | POA: Diagnosis not present

## 2016-01-13 ENCOUNTER — Emergency Department
Admission: EM | Admit: 2016-01-13 | Discharge: 2016-01-13 | Disposition: A | Discharge status: Home / Self Care | Payer: Commercial Managed Care - HMO | Source: Home / Self Care | Attending: Family Medicine | Admitting: Family Medicine

## 2016-01-13 DIAGNOSIS — J40 Bronchitis, not specified as acute or chronic: Secondary | ICD-10-CM

## 2016-01-13 DIAGNOSIS — J441 Chronic obstructive pulmonary disease with (acute) exacerbation: Secondary | ICD-10-CM

## 2016-01-13 MED ORDER — PREDNISONE 20 MG PO TABS: ORAL_TABLET | ORAL | 0 refills | Status: DC

## 2016-01-13 MED ORDER — AZITHROMYCIN 250 MG PO TABS: ORAL_TABLET | ORAL | 0 refills | Status: DC

## 2016-01-13 NOTE — ED Provider Notes (Signed)
[facility]    CSN: [id number] Arrival date & time: 01/13/16  1153     History   Chief Complaint Chief Complaint  Patient presents with  . Cough    HPI [name] is a 82 y.o. male.   About 5 weeks ago patient developed typical cold-like symptoms developing over several days, including mild sore throat, sinus congestion, headache, fatigue, and cough.  His wife developed a similar illness at that time.  All symptoms resolved except for persistent cough.  He has a history of COPD, and uses Spiriva daily.  Over the past 3 to 4 days he has felt somewhat worse, although he denies pleuritic pain, wheezes, or shortness of breath.  His cough is partly productive.   The history is provided by the patient and the spouse.    Past Medical History:  Diagnosis Date  . Allergic rhinitis   . Arthritis   . Constipation    takes Miralax daily as needed  . Coronary artery disease    s/p remote IWMI 2004 with PCI of the RCA with BMS and repeat cath 2007 due to recurrent CP and abnormal nuclear stress test and cath showed widely patent RCA stent with 30-50% distal stenosis before the takeoff of a PL and PDA and normal LVF.   [name]. Depression with anxiety    takes Clonazepam nightly  . GERD (gastroesophageal reflux disease)    takes Omeprazole and Pepcid daily  . Heart attack   . History of bronchitis    pulmonologist is Dr.Wert.  . Hyperlipidemia    takes Atorvastatin daily  . Hypertension    takes Diovan daily  . Joint pain   . RLS (restless legs syndrome)   . Unspecified vitamin D deficiency   . Vitamin B 12 deficiency     Patient Active Problem List   Diagnosis Date Noted  . Dementia 03/07/2015  . Fall (on) (from) other stairs and steps, sequela 03/07/2015  . S/P shoulder replacement 04/29/2014  . Essential hypertension 03/11/2014  . Mild cognitive impairment 03/12/2013  . Anxiety 03/12/2013  . CELLULITIS AND ABSCESS OF UPPER ARM AND FOREARM 07/05/2009  .  Dyslipidemia 03/21/2007  . MI, old 03/21/2007  . ALLERGIC RHINITIS 03/21/2007  . COUGH 01/14/2007  . CAD (coronary artery disease), native coronary artery 01/13/2007  . CHRONIC RHINITIS 01/13/2007  . COPD pfts pending  01/13/2007  . Esophageal reflux 01/13/2007  . DYSPNEA 01/13/2007    Past Surgical History:  Procedure Laterality Date  . CARDIAC CATHETERIZATION  2004  . cataract surgery    . COLONOSCOPY    . CORONARY ANGIOPLASTY     1 stent  . ESOPHAGOGASTRODUODENOSCOPY    . HAND SURGERY Bilateral   . HERNIA REPAIR Left    inguinal   . NASAL SINUS SURGERY    . REVERSE SHOULDER ARTHROPLASTY Right 04/29/2014   Procedure: REVERSE TOTAL SHOULDER ARTHROPLASTY ;  Surgeon: Francena HanlyKevin Supple, MD;  Location: [facility] OR;  Service: Orthopedics;  Laterality: Right;  . stents         Home Medications    Prior to Admission medications   Medication Sig Start Date End Date Taking? Authorizing Provider  tiotropium (SPIRIVA) 18 MCG inhalation capsule Place 18 mcg into inhaler and inhale daily.   Yes Historical Provider, MD  aspirin 81 MG tablet Take 81 mg by mouth daily.    Historical Provider, MD  azithromycin (ZITHROMAX Z-PAK) 250 MG tablet Take 2 tabs today; then begin one tab once daily for  4 more days. 01/13/16   Lattie Haw, MD  donepezil (ARICEPT) 5 MG tablet Take 2 tablets (10 mg total) by mouth at bedtime. 08/08/15   Micki Riley, MD  Fexofenadine HCl [facility] ALLERGY PO) Take 1,200 mg by mouth 2 (two) times daily.    Historical Provider, MD  fluticasone (FLONASE) 50 MCG/ACT nasal spray Place 1 spray into both nostrils 2 (two) times daily. Reported on 03/07/2015    Historical Provider, MD  L-Methylfolate-B12-B6-B2 (CEREFOLIN) 07-06-48-5 MG TABS Take by mouth.    Historical Provider, MD  predniSONE (DELTASONE) 20 MG tablet Take one tab by mouth twice daily for 5 days, then one daily for 3 days. Take with food. 01/13/16   Lattie Haw, MD    Family History Family History  Problem Relation  Age of Onset  . Asthma Mother   . Cancer - Prostate Father     Social History Social History  Substance Use Topics  . Smoking status: Former Smoker    Packs/day: 3.00    Years: 15.00    Types: Cigarettes    Quit date: 02/06/1964  . Smokeless tobacco: Not on file  . Alcohol use No     Allergies   Patient has no known allergies.   Review of Systems Review of Systems No sore throat + cough No pleuritic pain No wheezing No nasal congestion No post-nasal drainage No sinus pain/pressure No itchy/red eyes No earache No hemoptysis No SOB No fever/chills No nausea No vomiting No abdominal pain No diarrhea No urinary symptoms No skin rash + fatigue No myalgias No headache    Physical Exam Triage Vital Signs ED Triage Vitals  Enc Vitals Group     BP 01/13/16 1217 (!) 153/113     Pulse Rate 01/13/16 1217 80     Resp --      Temp 01/13/16 1217 97.5 F (36.4 C)     Temp Source 01/13/16 1217 Oral     SpO2 01/13/16 1217 95 %     Weight 01/13/16 1218 162 lb 6.4 oz (73.7 kg)     Height 01/13/16 1218 5\' 11"  (1.803 m)     Head Circumference --      Peak Flow --      Pain Score 01/13/16 1220 0     Pain Loc --      Pain Edu? --      Excl. in GC? --    No data found.   Updated Vital Signs BP (!) 153/113 (BP Location: Left Arm) Comment: Notified Dr. Cathren Harsh  Pulse 80   Temp 97.5 F (36.4 C) (Oral)   Ht 5\' 11"  (1.803 m)   Wt 162 lb 6.4 oz (73.7 kg)   SpO2 95%   BMI 22.65 kg/m   Visual Acuity Right Eye Distance:   Left Eye Distance:   Bilateral Distance:    Right Eye Near:   Left Eye Near:    Bilateral Near:     Physical Exam Nursing notes and Vital Signs reviewed. Appearance:  Patient appears stated age, and in no acute distress Eyes:  Pupils are equal, round, and reactive to light and accomodation.  Extraocular movement is intact.  Conjunctivae are not inflamed  Ears:   Right canal occluded with cerumen.  Left canal normal.  Left tympanic membrane  normal.  Nose:  Mildly congested turbinates.  No sinus tenderness.  Neck:  Supple.  No adenopathy Lungs:  Clear to auscultation.  Breath sounds are equal.  Moving air  well. Heart:  Regular rate and rhythm without murmurs, rubs, or gallops.  Abdomen:  Nontender without masses or hepatosplenomegaly.  Bowel sounds are present.  No CVA or flank tenderness.  Extremities:  No edema.  Skin:  No rash present.    UC Treatments / Results  Labs (all labs ordered are listed, but only abnormal results are displayed) Labs Reviewed - No data to display  EKG  EKG Interpretation None       Radiology No results found.  Procedures Procedures (including critical care time)  Medications Ordered in UC Medications - No data to display   Initial Impression / Assessment and Plan / UC Course  I have reviewed the triage vital signs and the nursing notes.  Pertinent labs & imaging results that were available during my care of the patient were reviewed by me and considered in my medical decision making (see chart for details).  Clinical Course   Begin Z-pak for atypical coverage, and prednisone burst/taper. Continue Spiriva inhaler. Take plain guaifenesin (1200mg  extended release tabs such as Mucinex) twice daily, with plenty of water, for cough and congestion.  Get adequate rest.   May take Delsym Cough Suppressant at bedtime for nighttime cough.   Stop all antihistamines for now, and other non-prescription cough/cold preparations.   Follow-up with family doctor if not improving about 7 to10 days.      Final Clinical Impressions(s) / UC Diagnoses   Final diagnoses:  Bronchitis  COPD exacerbation (HCC)    New Prescriptions New Prescriptions   AZITHROMYCIN (ZITHROMAX Z-PAK) 250 MG TABLET    Take 2 tabs today; then begin one tab once daily for 4 more days.   PREDNISONE (DELTASONE) 20 MG TABLET    Take one tab by mouth twice daily for 5 days, then one daily for 3 days. Take with food.       Lattie HawStephen A Earl Losee, MD 01/13/16 1341

## 2016-01-13 NOTE — ED Triage Notes (Signed)
Cough started in early November, and has progressively become worse.  Pt has been weak and tired per wife.

## 2016-01-13 NOTE — Discharge Instructions (Signed)
Take plain guaifenesin (1200mg extended release tabs such as Mucinex) twice daily, with plenty of water, for cough and congestion.  Get adequate rest.   °May take Delsym Cough Suppressant at bedtime for nighttime cough.  °Stop all antihistamines for now, and other non-prescription cough/cold preparations. ° °Follow-up with family doctor if not improving about 7 to10 days.  °

## 2016-02-10 DIAGNOSIS — J40 Bronchitis, not specified as acute or chronic: Secondary | ICD-10-CM | POA: Diagnosis not present

## 2016-02-10 DIAGNOSIS — J309 Allergic rhinitis, unspecified: Secondary | ICD-10-CM | POA: Diagnosis not present

## 2016-02-10 DIAGNOSIS — J449 Chronic obstructive pulmonary disease, unspecified: Secondary | ICD-10-CM | POA: Diagnosis not present

## 2016-02-16 ENCOUNTER — Telehealth: Payer: Self-pay | Admitting: Neurology

## 2016-02-16 NOTE — Telephone Encounter (Signed)
Ok thanks 

## 2016-02-16 NOTE — Telephone Encounter (Signed)
Pt's wife called to r/s appt on 02/21/16 with Eber Jonesarolyn, he has the flu. She did not want to r/s with Eber Jonesarolyn, says she prefers he see Dr Caryn BeeSethi  FYI

## 2016-02-21 ENCOUNTER — Ambulatory Visit: Payer: Commercial Managed Care - HMO | Admitting: Nurse Practitioner

## 2016-03-31 ENCOUNTER — Encounter: Payer: Self-pay | Admitting: Emergency Medicine

## 2016-03-31 ENCOUNTER — Emergency Department (INDEPENDENT_AMBULATORY_CARE_PROVIDER_SITE_OTHER)
Admission: EM | Admit: 2016-03-31 | Discharge: 2016-03-31 | Disposition: A | Discharge status: Home / Self Care | Payer: Medicare HMO | Source: Home / Self Care | Attending: Family Medicine | Admitting: Family Medicine

## 2016-03-31 DIAGNOSIS — J111 Influenza due to unidentified influenza virus with other respiratory manifestations: Secondary | ICD-10-CM

## 2016-03-31 DIAGNOSIS — R69 Illness, unspecified: Secondary | ICD-10-CM | POA: Diagnosis not present

## 2016-03-31 DIAGNOSIS — Z20828 Contact with and (suspected) exposure to other viral communicable diseases: Secondary | ICD-10-CM

## 2016-03-31 MED ORDER — TIOTROPIUM BROMIDE MONOHYDRATE 18 MCG IN CAPS: 18.0000 ug | ORAL_CAPSULE | Freq: Every day | RESPIRATORY_TRACT | 0 refills | Status: DC

## 2016-03-31 MED ORDER — OSELTAMIVIR PHOSPHATE 75 MG PO CAPS: 75.0000 mg | ORAL_CAPSULE | Freq: Two times a day (BID) | ORAL | 0 refills | Status: DC

## 2016-03-31 NOTE — ED Provider Notes (Signed)
CSN: [id number]     Arrival date & time 03/31/16  1152 History   First MD Initiated Contact with Patient 03/31/16 1219     Chief Complaint  Patient presents with  . Generalized Body Aches   (Consider location/radiation/quality/duration/timing/severity/associated sxs/prior Treatment) HPI  [name] is a 83 y.o. male presenting to UC with c/o sudden onset body aches, fatigue, cough and congestion. Wife notes she had the flu twice this year and only recently got over the 2nd case of the flu.  No known fever but pt has felt clammy.  No vomiting or diarrhea. Pt does have hx of COPD. Denies SOB. He did not get the flu vaccine this year.    Past Medical History:  Diagnosis Date  . Allergic rhinitis   . Arthritis   . Constipation    takes Miralax daily as needed  . Coronary artery disease    s/p remote IWMI 2004 with PCI of the RCA with BMS and repeat cath 2007 due to recurrent CP and abnormal nuclear stress test and cath showed widely patent RCA stent with 30-50% distal stenosis before the takeoff of a PL and PDA and normal LVF.   [name]. Depression with anxiety    takes Clonazepam nightly  . GERD (gastroesophageal reflux disease)    takes Omeprazole and Pepcid daily  . Heart attack   . History of bronchitis    pulmonologist is Dr.Wert.  . Hyperlipidemia    takes Atorvastatin daily  . Hypertension    takes Diovan daily  . Joint pain   . RLS (restless legs syndrome)   . Unspecified vitamin D deficiency   . Vitamin B 12 deficiency    Past Surgical History:  Procedure Laterality Date  . CARDIAC CATHETERIZATION  2004  . cataract surgery    . COLONOSCOPY    . CORONARY ANGIOPLASTY     1 stent  . ESOPHAGOGASTRODUODENOSCOPY    . HAND SURGERY Bilateral   . HERNIA REPAIR Left    inguinal   . NASAL SINUS SURGERY    . REVERSE SHOULDER ARTHROPLASTY Right 04/29/2014   Procedure: REVERSE TOTAL SHOULDER ARTHROPLASTY ;  Surgeon: Francena HanlyKevin Supple, MD;  Location: [facility] OR;  Service: Orthopedics;   Laterality: Right;  . stents     Family History  Problem Relation Age of Onset  . Asthma Mother   . Cancer - Prostate Father    Social History  Substance Use Topics  . Smoking status: Former Smoker    Packs/day: 3.00    Years: 15.00    Types: Cigarettes    Quit date: 02/06/1964  . Smokeless tobacco: Never Used  . Alcohol use No    Review of Systems  Constitutional: Positive for fatigue. Negative for chills and fever.  HENT: Positive for congestion. Negative for ear pain, sore throat, trouble swallowing and voice change.   Respiratory: Positive for cough. Negative for shortness of breath.   Cardiovascular: Negative for chest pain and palpitations.  Gastrointestinal: Negative for abdominal pain, diarrhea, nausea and vomiting.  Musculoskeletal: Positive for arthralgias, back pain and myalgias.  Skin: Negative for rash.  Neurological: Positive for headaches. Negative for dizziness and light-headedness.    Allergies  Patient has no known allergies.  Home Medications   Prior to Admission medications   Medication Sig Start Date End Date Taking? Authorizing Provider  aspirin 81 MG tablet Take 81 mg by mouth daily.    Historical Provider, MD  azithromycin (ZITHROMAX Z-PAK) 250 MG tablet Take 2 tabs today;  then begin one tab once daily for 4 more days. 01/13/16   Lattie Haw, MD  donepezil (ARICEPT) 5 MG tablet Take 2 tablets (10 mg total) by mouth at bedtime. 08/08/15   Micki Riley, MD  Fexofenadine HCl [facility] ALLERGY PO) Take 1,200 mg by mouth 2 (two) times daily.    Historical Provider, MD  fluticasone (FLONASE) 50 MCG/ACT nasal spray Place 1 spray into both nostrils 2 (two) times daily. Reported on 03/07/2015    Historical Provider, MD  L-Methylfolate-B12-B6-B2 (CEREFOLIN) 07-06-48-5 MG TABS Take by mouth.    Historical Provider, MD  oseltamivir (TAMIFLU) 75 MG capsule Take 1 capsule (75 mg total) by mouth every 12 (twelve) hours. 03/31/16   Junius Finner, PA-C  predniSONE  (DELTASONE) 20 MG tablet Take one tab by mouth twice daily for 5 days, then one daily for 3 days. Take with food. 01/13/16   Lattie Haw, MD  tiotropium (SPIRIVA) 18 MCG inhalation capsule Place 1 capsule (18 mcg total) into inhaler and inhale daily. 03/31/16   Junius Finner, PA-C   Meds Ordered and Administered this Visit  Medications - No data to display  BP 138/92 (BP Location: Right Arm)   Pulse 82   Temp 98.2 F (36.8 C) (Oral)   Wt 154 lb (69.9 kg)   SpO2 94%   BMI 21.48 kg/m  No data found.   Physical Exam  Constitutional: He is oriented to person, place, and time. He appears well-developed and well-nourished. No distress.  HENT:  Head: Normocephalic and atraumatic.  Right Ear: Tympanic membrane normal.  Left Ear: Tympanic membrane normal.  Nose: Nose normal.  Mouth/Throat: Uvula is midline, oropharynx is clear and moist and mucous membranes are normal.  Eyes: EOM are normal.  Neck: Normal range of motion. Neck supple.  Cardiovascular: Normal rate and regular rhythm.   Pulmonary/Chest: Effort normal and breath sounds normal. No stridor. No respiratory distress. He has no wheezes. He has no rales.  Musculoskeletal: Normal range of motion.  Lymphadenopathy:    He has no cervical adenopathy.  Neurological: He is alert and oriented to person, place, and time.  Skin: Skin is warm and dry. He is not diaphoretic.  Psychiatric: He has a normal mood and affect. His behavior is normal.  Nursing note and vitals reviewed.   Urgent Care Course     Procedures (including critical care time)  Labs Review Labs Reviewed - No data to display  Imaging Review No results found.    MDM   1. Influenza-like illness   2. Exposure to influenza    Hx and exam c/w influenza w/o evidence of underlying bacterial infection at this time.  Discussed risks/benefits of Tamiflu. Pt would like to try the treatment. Rx: Tamiflu   Encouraged fluids, rest, acetaminophen and ibuprofen.   Encouraged f/u with PCP in 1 week if not improving, sooner if worsening.      Junius Finner, PA-C 03/31/16 1230

## 2016-03-31 NOTE — ED Triage Notes (Signed)
Pt  C/o body aches and cough that started suddenly last night. Denies fever.

## 2016-03-31 NOTE — Discharge Instructions (Signed)
Oseltamivir (Tamiflu) is an antiviral medication that can help decrease symptoms of the flu by about 1 days and lessen severity of symptoms.  It is best to start this medication within first 48 hours of symptoms, however, some studies have shown relief even after the 48 hour time frame.  This medication may cause stomach upset including nausea, vomiting and diarrhea.  It may also cause dizziness or hallucinations in children.  To help prevent stomach upset, you may take this medication with food.  If you are still having unwanted symptoms, you may stop taking this medication as it is not as important to finish the entire course like antibiotics.  If you have questions/concerns please call our office or follow up with your primary care provider.   ° °

## 2016-04-02 ENCOUNTER — Telehealth: Payer: Self-pay | Admitting: *Deleted

## 2016-04-02 NOTE — Telephone Encounter (Signed)
Callback: No answer, LMOM f/u from visit. Call back as needed. 

## 2016-04-03 ENCOUNTER — Ambulatory Visit: Payer: Commercial Managed Care - HMO | Admitting: Neurology

## 2016-04-19 DIAGNOSIS — R05 Cough: Secondary | ICD-10-CM | POA: Diagnosis not present

## 2016-04-19 DIAGNOSIS — J301 Allergic rhinitis due to pollen: Secondary | ICD-10-CM | POA: Diagnosis not present

## 2016-05-02 ENCOUNTER — Ambulatory Visit: Payer: Commercial Managed Care - HMO | Admitting: Neurology

## 2016-05-28 ENCOUNTER — Other Ambulatory Visit: Payer: Self-pay | Admitting: Neurology

## 2016-06-08 DIAGNOSIS — R05 Cough: Secondary | ICD-10-CM | POA: Diagnosis not present

## 2016-06-08 DIAGNOSIS — J418 Mixed simple and mucopurulent chronic bronchitis: Secondary | ICD-10-CM | POA: Diagnosis not present

## 2016-06-29 DIAGNOSIS — J449 Chronic obstructive pulmonary disease, unspecified: Secondary | ICD-10-CM | POA: Diagnosis not present

## 2016-07-12 ENCOUNTER — Ambulatory Visit: Payer: Commercial Managed Care - HMO | Admitting: Neurology

## 2016-07-18 DIAGNOSIS — R7309 Other abnormal glucose: Secondary | ICD-10-CM | POA: Diagnosis not present

## 2016-07-18 DIAGNOSIS — I129 Hypertensive chronic kidney disease with stage 1 through stage 4 chronic kidney disease, or unspecified chronic kidney disease: Secondary | ICD-10-CM | POA: Diagnosis not present

## 2016-07-18 DIAGNOSIS — J449 Chronic obstructive pulmonary disease, unspecified: Secondary | ICD-10-CM | POA: Diagnosis not present

## 2016-07-18 DIAGNOSIS — I2510 Atherosclerotic heart disease of native coronary artery without angina pectoris: Secondary | ICD-10-CM | POA: Diagnosis not present

## 2016-09-05 ENCOUNTER — Ambulatory Visit: Payer: Commercial Managed Care - HMO | Admitting: Neurology

## 2016-09-19 DIAGNOSIS — N390 Urinary tract infection, site not specified: Secondary | ICD-10-CM | POA: Diagnosis not present

## 2016-09-19 DIAGNOSIS — Z0000 Encounter for general adult medical examination without abnormal findings: Secondary | ICD-10-CM | POA: Diagnosis not present

## 2016-09-19 DIAGNOSIS — I2510 Atherosclerotic heart disease of native coronary artery without angina pectoris: Secondary | ICD-10-CM | POA: Diagnosis not present

## 2016-09-19 DIAGNOSIS — E785 Hyperlipidemia, unspecified: Secondary | ICD-10-CM | POA: Diagnosis not present

## 2016-09-19 DIAGNOSIS — I129 Hypertensive chronic kidney disease with stage 1 through stage 4 chronic kidney disease, or unspecified chronic kidney disease: Secondary | ICD-10-CM | POA: Diagnosis not present

## 2016-09-19 DIAGNOSIS — R7309 Other abnormal glucose: Secondary | ICD-10-CM | POA: Diagnosis not present

## 2016-09-26 DIAGNOSIS — R7309 Other abnormal glucose: Secondary | ICD-10-CM | POA: Diagnosis not present

## 2016-09-26 DIAGNOSIS — I2510 Atherosclerotic heart disease of native coronary artery without angina pectoris: Secondary | ICD-10-CM | POA: Diagnosis not present

## 2016-09-26 DIAGNOSIS — I129 Hypertensive chronic kidney disease with stage 1 through stage 4 chronic kidney disease, or unspecified chronic kidney disease: Secondary | ICD-10-CM | POA: Diagnosis not present

## 2016-09-26 DIAGNOSIS — J449 Chronic obstructive pulmonary disease, unspecified: Secondary | ICD-10-CM | POA: Diagnosis not present

## 2017-01-03 ENCOUNTER — Telehealth: Payer: Self-pay

## 2017-01-03 NOTE — Telephone Encounter (Signed)
RN call wife that Dr. Pearlean BrownieSethi does not have any appts until next year. Rn stated 5 appts have been cancel twice because of flu reason, one was out of town, and two gave no reason. Rn advised pts wife to contact PCP of why her husband is falling a lot. Pts wife verbalized understanding.Pt was last seen 08/2015.

## 2017-01-03 NOTE — Telephone Encounter (Signed)
Patient's wife called to reschedule his follow up appointment, they were out of town for his last one. Dr. Marlis EdelsonSethi's next avail is 02/20/16 but she states that he has been falling a lot recently and if there is any way to get him in sooner, she would greatly appreciate it. She can be reached at her home number [phone]

## 2017-01-21 DIAGNOSIS — F0390 Unspecified dementia without behavioral disturbance: Secondary | ICD-10-CM | POA: Diagnosis not present

## 2017-01-21 DIAGNOSIS — R05 Cough: Secondary | ICD-10-CM | POA: Diagnosis not present

## 2017-02-19 ENCOUNTER — Encounter (INDEPENDENT_AMBULATORY_CARE_PROVIDER_SITE_OTHER): Payer: Self-pay

## 2017-02-19 ENCOUNTER — Ambulatory Visit: Payer: Medicare HMO | Admitting: Neurology

## 2017-02-19 ENCOUNTER — Encounter: Payer: Self-pay | Admitting: Neurology

## 2017-02-19 VITALS — BP 159/99 | HR 67 | Ht 68.0 in | Wt 157.8 lb

## 2017-02-19 DIAGNOSIS — F0150 Vascular dementia without behavioral disturbance: Secondary | ICD-10-CM | POA: Diagnosis not present

## 2017-02-19 DIAGNOSIS — R413 Other amnesia: Secondary | ICD-10-CM | POA: Diagnosis not present

## 2017-02-19 MED ORDER — DONEPEZIL HCL 10 MG PO TABS: 10.0000 mg | ORAL_TABLET | Freq: Every day | ORAL | 3 refills | Status: AC

## 2017-02-19 NOTE — Patient Instructions (Signed)
I had a long discussion with the patient and his wife regarding his recent cognitive worsening which likely seems related to having stopped his Aricept and other medications. I recommend check CT scan of the head to rule out recurrent subdural strokes but start Aricept 5 mg daily for 1 month and increase if tolerated without side effects or 10 mg daily. Patient was also advised fall and safety precautions. He'll return for follow-up in 3 months with nurse practitioner or call earlier if necessary

## 2017-02-19 NOTE — Progress Notes (Signed)
[facility] [address] [place]. [place] [address] [phone]       OFFICE CONSULT NOTE  [name] Date of Birth:  06/27/1931 Medical Record Number:  [id number]   Referring MD:  Shary Decamp  Reason for Referral:  Memory loss and fall  HPI: Initial Consult  03/07/2015 ;[name] is a 84 year old Caucasian male who is a complaint by his wife who provides most of the history. The patient was visiting family in [place] in December last year when [name] had a false down the stairs from the fifth floor and fractured his scapula and had I suspect a traumatic subarachnoid or subdural hemorrhage. I do not have the records from that hospitalization though [name] was admitted to [facility]. The patient has noted significant cognitive decline following this fall. The wife states that [name] has occasional confusion with good days and bad days as well as some agitation and delusions. The patient himself denies this. The patient in fact had seen me in February 2015 for memory loss and at that time the Mini-Mental status exam score documented as 25/30 and height felt [name] had mild cognitive impairment. [name] has been taking Cerefolin since then and fish oil. [name] had MRI scan of the brain done in February 2015 which was unremarkable. Lab work for treatable causes of cognitive impairment including vitamin B12, TSH, RPR and 1 all normal. Homocystine was mildly elevated. Patient has not had any stroke symptoms, seizures, loss of consciousness. The patient remains quite independent in activities of daily living. Does have some occasional delusions and some agitation but can be redirected. [name] has not been tried on Aricept or any other dementia medications. Update 08/08/2015 : [name] returns for follow-up after last visit 6 months ago. [name] is accompanied by his wife. Patient has noticed significant improvement in his cognition, memory and overall functioning at home after starting the Aricept. [name] is  tolerating 10 mg daily quite well without dizziness, nausea or upset stomach. [name] has in fact not been taking sedative full in NAC as there was a misunderstanding. Patient is fairly independent in all activities of daily living. [name] is quite physically active and does a lot of work around the house as well as likes working out in the yard. Patient's wife plans to start a gym and take them out soon. [name] did have a follow-up CT scan of the head done on 03/22/15 which I have reviewed shows  mild generalized atrophy and some changes of maxillary sinusitis without any acute abnormality. The previously seen subdural has resolved completely. Patient scored 28/30 on the Mini-Mental status testing today which is significantly improved from 17/30 at last visit. Patient denies any agitation, restlessness, and delusions. [name] does have occasional hallucinations. His balance is good though [name] is occasionally off balance but [name] is not had any major falls. Update 02/19/2017 : [name] returns for follow-up after last visit a year and a half ago. [name] is accompanied by his wife states that they missed several follow-up appointments as once [name] was sick with the flu and once [name] was in [place] living with his daughter. Review of patient's medications states that [name] is not been on Aricept for now more than a year. The wife agrees that that's when she noticed cognitive worsening but she did not think this was due to having stopped his Aricept. [name]'s also been having some intermittent incontinence as well as some gait and balance difficulties. [name] does have increased tendency to fall  and hurt himself. [name] is had no recent brain imaging studies done. [name] has some intermittent agitation but [name] can be redirected. [name] does not have hallucinations. At last visit [name] had shown significant improvement in his Mini-Mental score to  28/30 from a previous 17/30 prior to starting Aricept. Today his score is down to 17/30 ROS:   14 system review of systems is  positive for   memory loss,   confusion, decreased concentration, balance difficulty only and all other systems negative  PMH:  Past Medical History:  Diagnosis Date  . Allergic rhinitis   . Arthritis   . Constipation    takes Miralax daily as needed  . Coronary artery disease    s/p remote IWMI 2004 with PCI of the RCA with BMS and repeat cath 2007 due to recurrent CP and abnormal nuclear stress test and cath showed widely patent RCA stent with 30-50% distal stenosis before the takeoff of a PL and PDA and normal LVF.   [name]. Depression with anxiety    takes Clonazepam nightly  . GERD (gastroesophageal reflux disease)    takes Omeprazole and Pepcid daily  . Heart attack (HCC)   . History of bronchitis    pulmonologist is Dr.Wert.  . Hyperlipidemia    takes Atorvastatin daily  . Hypertension    takes Diovan daily  . Joint pain   . Memory loss   . RLS (restless legs syndrome)   . Unspecified vitamin D deficiency   . Vitamin B 12 deficiency     Social History:  Social History   Socioeconomic History  . Marital status: Married    Spouse name: Not on file  . Number of children: 4  . Years of education: 12th  . Highest education level: Not on file  Social Needs  . Financial resource strain: Not on file  . Food insecurity - worry: Not on file  . Food insecurity - inability: Not on file  . Transportation needs - medical: Not on file  . Transportation needs - non-medical: Not on file  Occupational History  . Occupation: retired  Tobacco Use  . Smoking status: Former Smoker    Packs/day: 3.00    Years: 15.00    Pack years: 45.00    Types: Cigarettes    Last attempt to quit: 02/06/1964    Years since quitting: 53.0  . Smokeless tobacco: Never Used  Substance and Sexual Activity  . Alcohol use: No    Alcohol/week: 0.0 oz  . Drug use: No  . Sexual activity: Not Currently  Other Topics Concern  . Not on file  Social History Narrative   Patient lives at home with his wife.     Medications:   Current Outpatient Medications on File Prior to Visit  Medication Sig Dispense Refill  . fluticasone (FLONASE) 50 MCG/ACT nasal spray Place 1 spray into both nostrils 2 (two) times daily. Reported on 03/07/2015    . tiotropium (SPIRIVA) 18 MCG inhalation capsule Place 1 capsule (18 mcg total) into inhaler and inhale daily. 30 capsule 0   No current facility-administered medications on file prior to visit.     Allergies:  No Known Allergies  Physical Exam General: well developed, well nourished elderly Caucasian male, seated, in no evident distress Head: head normocephalic and atraumatic.   Neck: supple with no carotid or supraclavicular bruits Cardiovascular: regular rate and rhythm, no murmurs Musculoskeletal: no deformity Skin:  no rash/petichiae Vascular:  Normal pulses all extremities  Neurologic Exam Mental  Status: Awake and fully alert. Oriented to place and time. Recent and remote memory diminished. Attention span, concentration and fund of knowledge poore. Mood and affect appropriate. Mini-Mental status exam score  17/30 ( last visit 28/30)  with   deficits in recall , orientation and attention only.. Animal naming test 4 only. Clock drawing 2/4. Cranial Nerves: Fundoscopic exam reveals sharp disc margins. Pupils equal, briskly reactive to light. Extraocular movements full without nystagmus. Visual fields full to confrontation. Hearing intact. Facial sensation intact. Face, tongue, palate moves normally and symmetrically.  Motor: Normal bulk and tone. Normal strength in all tested extremity muscles. Sensory.: intact to touch , pinprick , position and vibratory sensation.  Coordination: Rapid alternating movements normal in all extremities. Finger-to-nose and heel-to-shin performed accurately bilaterally. Gait and Station: Arises from chair without difficulty. Stance is normal. Gait demonstrates normal stride length and balance . Unable to heel, toe and tandem  walk without difficulty.  Reflexes: 1+ and symmetric. Toes downgoing.       ASSESSMENT: 84 year old gentleman with long-standing mild cognitive impairment which now seems to have progressed to mild dementia. But had shown good response to aricept in the past but after medication was inadvertently discontinued [name] seems to have gotten worse again. Remote history of traumatic subdural hemorrhage in December 2016.    PLAN: I had a long discussion with the patient and his wife regarding his recent cognitive worsening which likely seems related to having stopped his Aricept and other medications. I recommend check CT scan of the head to rule out recurrent subdural strokes but start Aricept 5 mg daily for 1 month and increase if tolerated without side effects or 10 mg daily. Patient was also advised fall and safety precautions. [name]'ll return for follow-up in 3 months with nurse practitioner or call earlier if necessary [name] was advised to do cognitively challenging activities like solving crossword puzzles, sudoku, playing bridge. [name] was also advised to follow fall risk precautions greater than 50% time during this 30 minute visit was spent on counseling and coordination of care about his dementia and memory loss and answering questions  Delia Heady, MD Note: This document was prepared with digital dictation and possible smart phrase technology. Any transcriptional errors that result from this process are unintentional.

## 2017-02-20 ENCOUNTER — Telehealth: Payer: Self-pay | Admitting: Neurology

## 2017-02-20 NOTE — Telephone Encounter (Signed)
Pt wife ([place] has called and is asking for a call from RN Katrina re: verifying some information re: appointment on yesterday

## 2017-02-20 NOTE — Telephone Encounter (Signed)
Rn call patients wife about visit from 02/19/2017. PTs wife wanted to know about the start date of aricept. Rn stated aricept was started January 2017. The wife wanted to discuss some more things with Dr. Pearlean BrownieSEthi concerning the visit. The wife has some more concerns. Rn ask why did she not mention any concerns in the office visit. The wife just has more questions. Rn stated Dr. Pearlean BrownieSethi cannot do office consult over the phone. PT wife verbalized understanding, and just wanted to tell him some things. Message sent to Dr. Pearlean BrownieSethi.

## 2017-02-22 NOTE — Telephone Encounter (Signed)
I spoke to the patient's wife and clarified my plan to both start the Aricept first and have imaging studies and testing done which I had proposed. She voiced understanding

## 2017-03-04 ENCOUNTER — Telehealth: Payer: Self-pay | Admitting: Neurology

## 2017-03-04 NOTE — Telephone Encounter (Signed)
Pts wife calling stating that pt has been saying he has "nutty" thoughts and acting very strange and has been wearing his wife's clothes. Please call to discuss as wife is getting very worried and concerned, she also cant talk freely because pt is usually hovering around. Things have gotten to the point of where she is scared to go to sleep.

## 2017-03-04 NOTE — Telephone Encounter (Signed)
Revised. 

## 2017-03-04 NOTE — Telephone Encounter (Addendum)
Rn call patients wife on dpr. Pts wife did Dr. Pearlean BrownieSEthi read the note when she call in. The wife stated now today he said the "enemy is against me".She is concerned for his safety. RN stated if she is concerned for his safety, or her safety to call 911. Rn also recommend that per Dr. Pearlean BrownieSethi she continues to give him the medication aricept daily, and keep patient safe. Rn stated the wife took the pt off  aricept  for a year, and just started taking February 19, 2017 at his last visit. Rn stated Dr. Pearlean BrownieSethi advised the medication takes about a month to get in the system. Rn recommend pt give her the medication daily, and keep her husband safe.Also to call 911 if she feels unsafe or feel her husband is a danger to him or her. The wife verbalized understanding of all the above directions.

## 2017-03-04 NOTE — Telephone Encounter (Signed)
Rn spoke with Dr.SEthi about wife calling to state pt acting nutty, acting very strange, wearing wife clothes. PEr Dr.SEthi pt was last seen 08/08/2015,and was off medications for a year prior to office visit on 02/19/2017. The aricept he restarted for patient takes about a month to get in the system. He recommend wife follow the aricept directions,and also to keep her husband safe at all times.

## 2017-03-07 DIAGNOSIS — F0390 Unspecified dementia without behavioral disturbance: Secondary | ICD-10-CM | POA: Diagnosis not present

## 2017-03-07 DIAGNOSIS — I2510 Atherosclerotic heart disease of native coronary artery without angina pectoris: Secondary | ICD-10-CM | POA: Diagnosis not present

## 2017-03-07 DIAGNOSIS — J449 Chronic obstructive pulmonary disease, unspecified: Secondary | ICD-10-CM | POA: Diagnosis not present

## 2017-03-07 DIAGNOSIS — R7309 Other abnormal glucose: Secondary | ICD-10-CM | POA: Diagnosis not present

## 2017-03-07 DIAGNOSIS — I129 Hypertensive chronic kidney disease with stage 1 through stage 4 chronic kidney disease, or unspecified chronic kidney disease: Secondary | ICD-10-CM | POA: Diagnosis not present

## 2017-03-14 NOTE — Telephone Encounter (Signed)
Pt wife(on DPR) has called now stating that pt goes to an adult day care and because he is so anxious wife states he just walks around and stalks people.  Wife is asking if he can be given something to help him relax and sit down.  Wife is asking to be called

## 2017-03-14 NOTE — Telephone Encounter (Signed)
I called the listed number for the patient's wife. I was unable to leave a message as the mailbox was full

## 2017-03-14 NOTE — Telephone Encounter (Signed)
Message sent to Dr. Sethi. 

## 2017-03-19 NOTE — Telephone Encounter (Signed)
I tried calling again and got no reply and left message on answering machine to call me back to discuss the problem

## 2017-03-20 DIAGNOSIS — I129 Hypertensive chronic kidney disease with stage 1 through stage 4 chronic kidney disease, or unspecified chronic kidney disease: Secondary | ICD-10-CM | POA: Diagnosis not present

## 2017-03-20 DIAGNOSIS — R7309 Other abnormal glucose: Secondary | ICD-10-CM | POA: Diagnosis not present

## 2017-03-20 DIAGNOSIS — I2510 Atherosclerotic heart disease of native coronary artery without angina pectoris: Secondary | ICD-10-CM | POA: Diagnosis not present

## 2017-03-20 DIAGNOSIS — E785 Hyperlipidemia, unspecified: Secondary | ICD-10-CM | POA: Diagnosis not present

## 2017-03-20 DIAGNOSIS — R197 Diarrhea, unspecified: Secondary | ICD-10-CM | POA: Diagnosis not present

## 2017-03-20 NOTE — Telephone Encounter (Signed)
Kindly advise the patient's wife to use call primary care physician to discuss issues of anxiety and diarrhea

## 2017-03-20 NOTE — Telephone Encounter (Signed)
Pt's wife returned providers call. She said most of the time the pt is right next to her and it could limit what she can say.  She also advised he is having trouble holding his bowels, he uses depends but it is hard to clean him up, is this typical of alzheimer?[name]. She stopped giving him coffee on Saturday and he seems to be doing better with BM yesterday and last night. He had labs today per PCP to see if he has a parasite that could be causing it.   Is there something that could be given for the anxiety? Please call to advise

## 2017-03-20 NOTE — Telephone Encounter (Signed)
Rn call patients wife about pt needing something for anxiety. Rn stated per Dr. Pearlean BrownieSethi, this should be address by his PCP. The wife also stated her husband is having trouble holding his bowels, could this be alzheimer's disease.Rn stated per Dr. Pearlean BrownieSethi the pt was off medication for a year so continue prescribing the aricept to him. Rn stated if he is having some bowel issue contact PCP. Pt will be seeing his PCP on Monday. The wife verbalized understanding.

## 2017-03-27 DIAGNOSIS — I2510 Atherosclerotic heart disease of native coronary artery without angina pectoris: Secondary | ICD-10-CM | POA: Diagnosis not present

## 2017-03-27 DIAGNOSIS — J449 Chronic obstructive pulmonary disease, unspecified: Secondary | ICD-10-CM | POA: Diagnosis not present

## 2017-03-27 DIAGNOSIS — I129 Hypertensive chronic kidney disease with stage 1 through stage 4 chronic kidney disease, or unspecified chronic kidney disease: Secondary | ICD-10-CM | POA: Diagnosis not present

## 2017-03-27 DIAGNOSIS — I700 Atherosclerosis of aorta: Secondary | ICD-10-CM | POA: Diagnosis not present

## 2017-03-27 DIAGNOSIS — E785 Hyperlipidemia, unspecified: Secondary | ICD-10-CM | POA: Diagnosis not present

## 2017-04-12 DIAGNOSIS — J399 Disease of upper respiratory tract, unspecified: Secondary | ICD-10-CM | POA: Diagnosis not present

## 2017-04-12 DIAGNOSIS — T148XXA Other injury of unspecified body region, initial encounter: Secondary | ICD-10-CM | POA: Diagnosis not present

## 2017-04-12 DIAGNOSIS — R0989 Other specified symptoms and signs involving the circulatory and respiratory systems: Secondary | ICD-10-CM | POA: Diagnosis not present

## 2017-05-14 DIAGNOSIS — N390 Urinary tract infection, site not specified: Secondary | ICD-10-CM | POA: Diagnosis not present

## 2017-05-21 ENCOUNTER — Ambulatory Visit: Payer: Medicare HMO | Admitting: Adult Health

## 2017-06-17 ENCOUNTER — Encounter: Payer: Self-pay | Admitting: Adult Health

## 2017-06-17 ENCOUNTER — Ambulatory Visit: Payer: Medicare HMO | Admitting: Adult Health

## 2017-06-17 VITALS — BP 126/87 | HR 65 | Ht 68.0 in | Wt 155.4 lb

## 2017-06-17 DIAGNOSIS — F0280 Dementia in other diseases classified elsewhere without behavioral disturbance: Secondary | ICD-10-CM

## 2017-06-17 DIAGNOSIS — G300 Alzheimer's disease with early onset: Secondary | ICD-10-CM

## 2017-06-17 MED ORDER — MEMANTINE HCL 28 X 5 MG & 21 X 10 MG PO TABS: ORAL_TABLET | ORAL | 0 refills | Status: DC

## 2017-06-17 NOTE — Progress Notes (Signed)
[facility] [address] [place]. [place] [address] [phone]       OFFICE FOLLOW UP NOTE  [name] Date of Birth:  05/25/1931 Medical Record Number:  [id number]   Referring MD:  Shary Decamp  Reason for Referral:  Memory loss and fall  HPI: Initial Consult  03/07/2015 ;[name] is a 84 year old Caucasian male who is a complaint by his wife who provides most of the history. The patient was visiting family in [place] in December last year when he had a false down the stairs from the fifth floor and fractured his scapula and had I suspect a traumatic subarachnoid or subdural hemorrhage. I do not have the records from that hospitalization though he was admitted to [facility]. The patient has noted significant cognitive decline following this fall. The wife states that he has occasional confusion with good days and bad days as well as some agitation and delusions. The patient himself denies this. The patient in fact had seen me in February 2015 for memory loss and at that time the Mini-Mental status exam score documented as 25/30 and height felt he had mild cognitive impairment. He has been taking Cerefolin since then and fish oil. He had MRI scan of the brain done in February 2015 which was unremarkable. Lab work for treatable causes of cognitive impairment including vitamin B12, TSH, RPR and 1 all normal. Homocystine was mildly elevated. Patient has not had any stroke symptoms, seizures, loss of consciousness. The patient remains quite independent in activities of daily living. Does have some occasional delusions and some agitation but can be redirected. He has not been tried on Aricept or any other dementia medications.  08/08/2015 visit [name] : He returns for follow-up after last visit 6 months ago. He is accompanied by his wife. Patient has noticed significant improvement in his cognition, memory and overall functioning at home after starting the  Aricept. He is tolerating 10 mg daily quite well without dizziness, nausea or upset stomach. He has in fact not been taking sedative full in NAC as there was a misunderstanding. Patient is fairly independent in all activities of daily living. He is quite physically active and does a lot of work around the house as well as likes working out in the yard. Patient's wife plans to start a gym and take them out soon. He did have a follow-up CT scan of the head done on 03/22/15 which I have reviewed shows  mild generalized atrophy and some changes of maxillary sinusitis without any acute abnormality. The previously seen subdural has resolved completely. Patient scored 28/30 on the Mini-Mental status testing today which is significantly improved from 17/30 at last visit. Patient denies any agitation, restlessness, and delusions. He does have occasional hallucinations. His balance is good though he is occasionally off balance but he is not had any major falls.  02/19/2017 visit Dr. Pearlean Brownie: He returns for follow-up after last visit a year and a half ago. He is accompanied by his wife states that they missed several follow-up appointments as once he was sick with the flu and once he was in [place] living with his daughter. Review of patient's medications states that he is not been on Aricept for now more than a year. The wife agrees that that's when she noticed cognitive worsening but she did not think this was due to having stopped his Aricept. He's also been having some intermittent incontinence as well as some gait and balance difficulties. He  does have increased tendency to fall and hurt himself. He is had no recent brain imaging studies done. He has some intermittent agitation but he can be redirected. He does not have hallucinations. At last visit he had shown significant improvement in his Mini-Mental score to  28/30 from a previous 17/30 prior to starting Aricept. Today his score is down to 17/30  06/17/17 UPDATE:  Patient and wife returns today for 42-month follow-up.  He is accompanied by his wife who is his caregiver at this time.  Patient has been compliant with Aricept 10 mg daily without side effects.  MMSE 14/30 (previously 17/30).  Wife states that he was recently started on doxepin 10 mg nightly to help with sleep but this is not been helping him.  He continues to get up in the middle the night and wanders and at times she finds him upstairs and she is afraid that he may fall.  She used to be able to wake up in the night when he would wake up but she states that she has increased fatigue and at times would sleep through the night.  Per wife, patient continues to have anxiety where he will pace around the room but is unable to state what is bothering him.  He was going to an adult daycare but wife was asked to limit the amount of time he goes there due to frequent pacing, following other people around, and constantly calling out for her.  She states that she will start bringing him and be 2-3 times per week.  She states that he will get angry occasionally but this is getting better.  Recommended starting Namenda which may help the worsening of memory and anxiety symptoms.  Patient asking if I could prescribe something for anxiety but advised wife that his PCP should continue to prescribe for anxiety/insomnia related issues.  Advised wife that at times Namenda can help with anxiety and patient should continue Namenda for approximately 1 month for it to have full effect prior to starting another medication to help with anxiety related issues.  Wife in agreement to this plan.  Wife is planning on contacting PCP regarding possible increase or change in doxepin as this is not been helping with his sleep.  Denies new or worsening issues at this time.    ROS:   14 system review of systems is positive for activity change, appetite change, unexpected weight change, drooling, cough, cold intolerance, excessive eating,  diarrhea, incontinence of bowels, frequent waking, incontinence of bladder, urine decreased, walking difficulty, itching, memory loss, speech difficulty, agitation, confusion, decreased concentration, depression, and nervous/anxious and all other systems negative   PMH:  Past Medical History:  Diagnosis Date  . Allergic rhinitis   . Arthritis   . Constipation    takes Miralax daily as needed  . Coronary artery disease    s/p remote IWMI 2004 with PCI of the RCA with BMS and repeat cath 2007 due to recurrent CP and abnormal nuclear stress test and cath showed widely patent RCA stent with 30-50% distal stenosis before the takeoff of a PL and PDA and normal LVF.   [name] Depression with anxiety    takes Clonazepam nightly  . GERD (gastroesophageal reflux disease)    takes Omeprazole and Pepcid daily  . Heart attack (HCC)   . History of bronchitis    pulmonologist is Dr.Wert.  . Hyperlipidemia    takes Atorvastatin daily  . Hypertension    takes Diovan daily  . Joint  pain   . Memory loss   . RLS (restless legs syndrome)   . Unspecified vitamin D deficiency   . Vitamin B 12 deficiency     Social History:  Social History   Socioeconomic History  . Marital status: Married    Spouse name: Not on file  . Number of children: 4  . Years of education: 12th  . Highest education level: Not on file  Occupational History  . Occupation: retired  Engineer, production  . Financial resource strain: Not on file  . Food insecurity:    Worry: Not on file    Inability: Not on file  . Transportation needs:    Medical: Not on file    Non-medical: Not on file  Tobacco Use  . Smoking status: Former Smoker    Packs/day: 3.00    Years: 15.00    Pack years: 45.00    Types: Cigarettes    Last attempt to quit: 02/06/1964    Years since quitting: 53.3  . Smokeless tobacco: Never Used  Substance and Sexual Activity  . Alcohol use: No    Alcohol/week: 0.0 oz  . Drug use: No  . Sexual activity: Not  Currently  Lifestyle  . Physical activity:    Days per week: Not on file    Minutes per session: Not on file  . Stress: Not on file  Relationships  . Social connections:    Talks on phone: Not on file    Gets together: Not on file    Attends religious service: Not on file    Active member of club or organization: Not on file    Attends meetings of clubs or organizations: Not on file    Relationship status: Not on file  . Intimate partner violence:    Fear of current or ex partner: Not on file    Emotionally abused: Not on file    Physically abused: Not on file    Forced sexual activity: Not on file  Other Topics Concern  . Not on file  Social History Narrative   Patient lives at home with his wife.    Medications:   Current Outpatient Medications on File Prior to Visit  Medication Sig Dispense Refill  . donepezil (ARICEPT) 10 MG tablet Take 1 tablet (10 mg total) by mouth at bedtime. Start 1/2 tablet daily x 4 weeks then 1 tablet daily 30 tablet 3  . fluticasone (FLONASE) 50 MCG/ACT nasal spray Place 1 spray into both nostrils 2 (two) times daily. Reported on 03/07/2015    . tiotropium (SPIRIVA) 18 MCG inhalation capsule Place 1 capsule (18 mcg total) into inhaler and inhale daily. 30 capsule 0   No current facility-administered medications on file prior to visit.     Allergies:  No Known Allergies  Vitals:   06/17/17 1022  BP: 126/87  Pulse: 65   Physical Exam General: well developed, well nourished pleasant elderly Caucasian male, seated, in no evident distress Head: head normocephalic and atraumatic.   Neck: supple with no carotid or supraclavicular bruits Cardiovascular: regular rate and rhythm, no murmurs Musculoskeletal: no deformity Skin:  no rash/petichiae Vascular:  Normal pulses all extremities  Neurologic Exam Mental Status: Awake and fully alert. Disoriented to place and time. Recent and remote memory diminished. Attention span, concentration and fund of  knowledge poor. Mood and affect appropriate.   MMSE - Mini Mental State Exam 06/17/2017 02/19/2017  Orientation to time 0 2  Orientation to Place 1 3  Registration  3 3  Attention/ Calculation 0 0  Recall 1 1  Language- name 2 objects 2 2  Language- repeat 1 1  Language- follow 3 step command 3 3  Language- read & follow direction 1 1  Write a sentence 1 1  Copy design 1 0  Total score 14 17    Cranial Nerves: Fundoscopic exam reveals sharp disc margins. Pupils equal, briskly reactive to light. Extraocular movements full without nystagmus. Visual fields full to confrontation. Hearing intact. Facial sensation intact. Face, tongue, palate moves normally and symmetrically.  Motor: Normal bulk and tone. Normal strength in all tested extremity muscles. Sensory.: intact to touch , pinprick , position and vibratory sensation.  Coordination: Rapid alternating movements normal in all extremities. Finger-to-nose and heel-to-shin performed accurately bilaterally. Gait and Station: Arises from chair without difficulty. Stance is hunched.  Gait demonstrates normal stride length and balance . Unable to heel, toe and tandem walk without difficulty.  Reflexes: 1+ and symmetric. Toes downgoing.     ASSESSMENT: 84 year old gentleman with long-standing mild cognitive impairment which now seems to have progressed to mild dementia. But had shown good response to aricept in the past but after medication was inadvertently discontinued he seems to have gotten worse again. Remote history of traumatic subdural hemorrhage in December 2016.  Patient is accompanied to his appointment by his wife and has worsening in MMSE from 17 to 14.     PLAN: -Continue aricept  -Start namenda titration pack as follows: 5 mg/day for =1 week 5 mg twice daily for =1 week 15 mg/day given in 5 mg and 10 mg separated doses for =1 week  then 10 mg twice daily (reviewed titration rate with wife and provided a copy of this on  AVS - verbalized understanding) -Please call our office to let us know he is tolerating well and we will place additional refills at that time -if patient tolerates Namenda, consider ordering Namzaric at follow up -Continue mind challenging exercises such as suduku, word search, card games and puzzels -CT head not obtained - recommend patient undergo scan especially with worsening memory in 3 months  Follow up in 3 months or call earlier if needed  Greater than 50% time during this 30 minute visit was spent on counseling and coordination of care about his dementia and memory loss and answering questions  George Hugh, [facility]  [facility] [address] [place], [place] [address]  Phone [phone] Fax [phone]

## 2017-06-17 NOTE — Progress Notes (Signed)
I agree with the above plan 

## 2017-06-17 NOTE — Patient Instructions (Addendum)
Your Plan:  Continue aricept   Start namenda titration pack as follows  5 mg/day for =1 week 5 mg twice daily for =1 week 15 mg/day given in 5 mg and 10 mg separated doses for =1 week  then 10 mg twice daily   Please call our office to let us know he is tolerating well and we will place additional refills at that time  Continue mind challenging exercises such as suduku, word search, card games and puzzels     Thank you for coming to see [name] at [facility]. I hope we have been able to provide you high quality care today.  You may receive a patient satisfaction survey over the next few weeks. We would appreciate your feedback and comments so that we may continue to improve ourselves and the health of our patients.

## 2017-06-19 DIAGNOSIS — G4700 Insomnia, unspecified: Secondary | ICD-10-CM | POA: Diagnosis not present

## 2017-06-19 DIAGNOSIS — I2510 Atherosclerotic heart disease of native coronary artery without angina pectoris: Secondary | ICD-10-CM | POA: Diagnosis not present

## 2017-06-19 DIAGNOSIS — R197 Diarrhea, unspecified: Secondary | ICD-10-CM | POA: Diagnosis not present

## 2017-06-19 DIAGNOSIS — F0390 Unspecified dementia without behavioral disturbance: Secondary | ICD-10-CM | POA: Diagnosis not present

## 2017-06-19 DIAGNOSIS — R05 Cough: Secondary | ICD-10-CM | POA: Diagnosis not present

## 2017-06-19 DIAGNOSIS — I129 Hypertensive chronic kidney disease with stage 1 through stage 4 chronic kidney disease, or unspecified chronic kidney disease: Secondary | ICD-10-CM | POA: Diagnosis not present

## 2017-06-22 DIAGNOSIS — Z9181 History of falling: Secondary | ICD-10-CM | POA: Diagnosis not present

## 2017-06-22 DIAGNOSIS — Z959 Presence of cardiac and vascular implant and graft, unspecified: Secondary | ICD-10-CM | POA: Diagnosis not present

## 2017-06-22 DIAGNOSIS — F0390 Unspecified dementia without behavioral disturbance: Secondary | ICD-10-CM | POA: Diagnosis not present

## 2017-06-22 DIAGNOSIS — R262 Difficulty in walking, not elsewhere classified: Secondary | ICD-10-CM | POA: Diagnosis not present

## 2017-06-22 DIAGNOSIS — I2510 Atherosclerotic heart disease of native coronary artery without angina pectoris: Secondary | ICD-10-CM | POA: Diagnosis not present

## 2017-06-22 DIAGNOSIS — G2581 Restless legs syndrome: Secondary | ICD-10-CM | POA: Diagnosis not present

## 2017-06-22 DIAGNOSIS — I10 Essential (primary) hypertension: Secondary | ICD-10-CM | POA: Diagnosis not present

## 2017-06-22 DIAGNOSIS — J449 Chronic obstructive pulmonary disease, unspecified: Secondary | ICD-10-CM | POA: Diagnosis not present

## 2017-06-26 DIAGNOSIS — I2510 Atherosclerotic heart disease of native coronary artery without angina pectoris: Secondary | ICD-10-CM | POA: Diagnosis not present

## 2017-06-26 DIAGNOSIS — I10 Essential (primary) hypertension: Secondary | ICD-10-CM | POA: Diagnosis not present

## 2017-06-26 DIAGNOSIS — F0390 Unspecified dementia without behavioral disturbance: Secondary | ICD-10-CM | POA: Diagnosis not present

## 2017-06-26 DIAGNOSIS — G2581 Restless legs syndrome: Secondary | ICD-10-CM | POA: Diagnosis not present

## 2017-06-26 DIAGNOSIS — J449 Chronic obstructive pulmonary disease, unspecified: Secondary | ICD-10-CM | POA: Diagnosis not present

## 2017-06-26 DIAGNOSIS — Z9181 History of falling: Secondary | ICD-10-CM | POA: Diagnosis not present

## 2017-06-26 DIAGNOSIS — Z959 Presence of cardiac and vascular implant and graft, unspecified: Secondary | ICD-10-CM | POA: Diagnosis not present

## 2017-06-26 DIAGNOSIS — R262 Difficulty in walking, not elsewhere classified: Secondary | ICD-10-CM | POA: Diagnosis not present

## 2017-07-03 ENCOUNTER — Inpatient Hospital Stay: Admission: RE | Admit: 2017-07-03 | Payer: No Typology Code available for payment source | Source: Ambulatory Visit

## 2017-07-03 DIAGNOSIS — I2510 Atherosclerotic heart disease of native coronary artery without angina pectoris: Secondary | ICD-10-CM | POA: Diagnosis not present

## 2017-07-03 DIAGNOSIS — B029 Zoster without complications: Secondary | ICD-10-CM | POA: Diagnosis not present

## 2017-07-03 DIAGNOSIS — I129 Hypertensive chronic kidney disease with stage 1 through stage 4 chronic kidney disease, or unspecified chronic kidney disease: Secondary | ICD-10-CM | POA: Diagnosis not present

## 2017-07-04 ENCOUNTER — Telehealth: Payer: Self-pay | Admitting: Adult Health

## 2017-07-04 NOTE — Telephone Encounter (Signed)
Rn call patients wife about wanting to speak with [name]. Rn reminded wife Dr. Pearlean Brownie is working in the hospital this week,and will return to office on 07/08/2017. RN ask what issues and questions do she have. The wife stated the last two  times her husband has been seen, she has not seen Dr. Pearlean Brownie at all. Rn explain to patient at May 2019 visit she saw Shanda Bumps NP for the appt. Rn explain on DrMarland Kitchen Pearlean Brownie last note in January 2019 that's what he requested in the follow up notes. RN stated that's why her husband saw Shanda Bumps NP. The wife stated the last time Dr. Pearlean Brownie saw her husband she did not see him at all. The wife stated "I brought my husband to the appt, but I remember you bringing [name] back to the room, but I never spoke to D.Sethi or had the chance to ask questions about my husband". Rn reminded pt she was present during the visit because of her husbands memory declining issues. Also Rn explain to wife that was the husband first visit since 2017. The wife again repeated I never spoke to Dr. Pearlean Brownie or saw him. Rn was confused of the way saying this. The pt was seen by February 19, 2017 by Dr.Sethi with wife,and wife made a phone call on 02/20/2017. PEr the phone notes Dr. Pearlean Brownie spoke to wife on 02/22/2017. Rn will send message to Dr.Sethi. PT wife verbalized understanding, and knows it may be till Monday before a call is return.

## 2017-07-04 NOTE — Telephone Encounter (Signed)
Pt's wife called to advise he has been dx with the shingles. The CT scan has been postponed to 6/12 and if he is not clear by then it will be pushed out again.Lorain Childes.  Pt's wife also is wanting to talk with [name], she requested to make an appt to speak with him but I advised her that was not possible. She is requesting a call back from [name] to discuss some issues regarding the pt.

## 2017-07-05 ENCOUNTER — Inpatient Hospital Stay: Admission: RE | Admit: 2017-07-05 | Payer: No Typology Code available for payment source | Source: Ambulatory Visit

## 2017-07-08 NOTE — Telephone Encounter (Signed)
Ask Shanda BumpsJessica to speak to wife and see what she wants`s as she saw patient last time.

## 2017-07-09 NOTE — Telephone Encounter (Signed)
Attempted to call wife in regards to recent concerns. No answer at this time. Left a message requesting a call back.

## 2017-07-11 DIAGNOSIS — J449 Chronic obstructive pulmonary disease, unspecified: Secondary | ICD-10-CM | POA: Diagnosis not present

## 2017-07-11 DIAGNOSIS — Z8619 Personal history of other infectious and parasitic diseases: Secondary | ICD-10-CM | POA: Diagnosis not present

## 2017-07-11 DIAGNOSIS — B029 Zoster without complications: Secondary | ICD-10-CM | POA: Diagnosis not present

## 2017-07-12 NOTE — Telephone Encounter (Signed)
Patient's wife called back in regards to recent concerns about husband's worsening dementia with behavioral issues.  This is been occurring over the past few weeks where intermittently he makes threats such as hiring somebody to get rid of his wife, needing to get rid of the "enemy" and when wife asked who the anatomy is, he will state "the baby" and point to his great care granddaughter, along with threatening wife that he will be suffocating her in her sleep.  These threats/comments are sporadic and do not happen specifically during the day or the night.  Wife states that he was recently treated for shingles but did see PCP yesterday and stated these are completely healed and is able to return to day program.  She states that he has been having a decline in memory and increasing generalized weakness.  Per wife, patient was started on doxepin 2 capsules nightly and alprazolam 1 tablet in the morning and 1 tab in the evening by his PCP.  Consideration should be taken as far as if patient is having side effects to these medications which is worsening his anxiety and behaviors.  Advised wife that if she feels threatened or in fear of her life, she can call 911 or take patient to the hospital.  She is currently waiting on bed placement for higher level of care but has been having insurance issues.  It was also stated that along with possible's side effects from medications, he could also be suffering from an acute infection and he should be worked up for this.  Wife states that they do not have the funds to be taking him to the emergency room at this time.  It was then advised for wife to call PCP regarding these concerns as she has not made PCP aware of these concerns and they could work him up for possible acute infection and/or possible change in medication management.  Wife does have Child psychotherapistsocial worker who she was advised to call to help assist with placement as it seems as though patient is not safe living at home at  this time due to recent increase in dementia.  Patient's wife was advised to call back if she has any further questions or concerns.  Appreciated phone call and assistance.

## 2017-07-16 DIAGNOSIS — B029 Zoster without complications: Secondary | ICD-10-CM | POA: Diagnosis not present

## 2017-07-16 DIAGNOSIS — R109 Unspecified abdominal pain: Secondary | ICD-10-CM | POA: Diagnosis not present

## 2017-07-16 DIAGNOSIS — K30 Functional dyspepsia: Secondary | ICD-10-CM | POA: Diagnosis not present

## 2017-07-16 DIAGNOSIS — B0229 Other postherpetic nervous system involvement: Secondary | ICD-10-CM | POA: Diagnosis not present

## 2017-07-17 ENCOUNTER — Inpatient Hospital Stay: Admission: RE | Admit: 2017-07-17 | Payer: No Typology Code available for payment source | Source: Ambulatory Visit

## 2017-07-17 DIAGNOSIS — G2581 Restless legs syndrome: Secondary | ICD-10-CM | POA: Diagnosis not present

## 2017-07-17 DIAGNOSIS — Z9181 History of falling: Secondary | ICD-10-CM | POA: Diagnosis not present

## 2017-07-17 DIAGNOSIS — J449 Chronic obstructive pulmonary disease, unspecified: Secondary | ICD-10-CM | POA: Diagnosis not present

## 2017-07-17 DIAGNOSIS — Z959 Presence of cardiac and vascular implant and graft, unspecified: Secondary | ICD-10-CM | POA: Diagnosis not present

## 2017-07-17 DIAGNOSIS — R262 Difficulty in walking, not elsewhere classified: Secondary | ICD-10-CM | POA: Diagnosis not present

## 2017-07-17 DIAGNOSIS — I10 Essential (primary) hypertension: Secondary | ICD-10-CM | POA: Diagnosis not present

## 2017-07-17 DIAGNOSIS — F0390 Unspecified dementia without behavioral disturbance: Secondary | ICD-10-CM | POA: Diagnosis not present

## 2017-07-17 DIAGNOSIS — I2510 Atherosclerotic heart disease of native coronary artery without angina pectoris: Secondary | ICD-10-CM | POA: Diagnosis not present

## 2017-07-24 DIAGNOSIS — Z959 Presence of cardiac and vascular implant and graft, unspecified: Secondary | ICD-10-CM | POA: Diagnosis not present

## 2017-07-24 DIAGNOSIS — F0390 Unspecified dementia without behavioral disturbance: Secondary | ICD-10-CM | POA: Diagnosis not present

## 2017-07-24 DIAGNOSIS — I10 Essential (primary) hypertension: Secondary | ICD-10-CM | POA: Diagnosis not present

## 2017-07-24 DIAGNOSIS — R262 Difficulty in walking, not elsewhere classified: Secondary | ICD-10-CM | POA: Diagnosis not present

## 2017-07-24 DIAGNOSIS — J449 Chronic obstructive pulmonary disease, unspecified: Secondary | ICD-10-CM | POA: Diagnosis not present

## 2017-07-24 DIAGNOSIS — Z9181 History of falling: Secondary | ICD-10-CM | POA: Diagnosis not present

## 2017-07-24 DIAGNOSIS — I2510 Atherosclerotic heart disease of native coronary artery without angina pectoris: Secondary | ICD-10-CM | POA: Diagnosis not present

## 2017-07-24 DIAGNOSIS — G2581 Restless legs syndrome: Secondary | ICD-10-CM | POA: Diagnosis not present

## 2017-07-25 ENCOUNTER — Encounter (HOSPITAL_COMMUNITY): Payer: Self-pay

## 2017-07-25 ENCOUNTER — Other Ambulatory Visit: Payer: Self-pay

## 2017-07-25 ENCOUNTER — Inpatient Hospital Stay (HOSPITAL_COMMUNITY)
Admission: EM | Admit: 2017-07-25 | Discharge: 2017-08-01 | DRG: 281 | Disposition: A | Discharge status: Skilled Nursing Facility | Payer: Medicare HMO | Attending: Cardiology | Admitting: Cardiology

## 2017-07-25 ENCOUNTER — Emergency Department (HOSPITAL_COMMUNITY): Payer: Medicare HMO

## 2017-07-25 DIAGNOSIS — M6281 Muscle weakness (generalized): Secondary | ICD-10-CM | POA: Diagnosis not present

## 2017-07-25 DIAGNOSIS — G2581 Restless legs syndrome: Secondary | ICD-10-CM | POA: Diagnosis present

## 2017-07-25 DIAGNOSIS — G3184 Mild cognitive impairment, so stated: Secondary | ICD-10-CM | POA: Diagnosis present

## 2017-07-25 DIAGNOSIS — R319 Hematuria, unspecified: Secondary | ICD-10-CM | POA: Diagnosis not present

## 2017-07-25 DIAGNOSIS — J449 Chronic obstructive pulmonary disease, unspecified: Secondary | ICD-10-CM | POA: Diagnosis present

## 2017-07-25 DIAGNOSIS — I214 Non-ST elevation (NSTEMI) myocardial infarction: Secondary | ICD-10-CM | POA: Diagnosis not present

## 2017-07-25 DIAGNOSIS — R41841 Cognitive communication deficit: Secondary | ICD-10-CM | POA: Diagnosis not present

## 2017-07-25 DIAGNOSIS — I10 Essential (primary) hypertension: Secondary | ICD-10-CM | POA: Diagnosis present

## 2017-07-25 DIAGNOSIS — Z515 Encounter for palliative care: Secondary | ICD-10-CM | POA: Diagnosis not present

## 2017-07-25 DIAGNOSIS — I2510 Atherosclerotic heart disease of native coronary artery without angina pectoris: Secondary | ICD-10-CM | POA: Diagnosis present

## 2017-07-25 DIAGNOSIS — F0391 Unspecified dementia with behavioral disturbance: Secondary | ICD-10-CM | POA: Diagnosis present

## 2017-07-25 DIAGNOSIS — F0390 Unspecified dementia without behavioral disturbance: Secondary | ICD-10-CM | POA: Diagnosis not present

## 2017-07-25 DIAGNOSIS — R32 Unspecified urinary incontinence: Secondary | ICD-10-CM | POA: Diagnosis present

## 2017-07-25 DIAGNOSIS — N183 Chronic kidney disease, stage 3 (moderate): Secondary | ICD-10-CM | POA: Diagnosis present

## 2017-07-25 DIAGNOSIS — F068 Other specified mental disorders due to known physiological condition: Secondary | ICD-10-CM | POA: Diagnosis present

## 2017-07-25 DIAGNOSIS — R0789 Other chest pain: Secondary | ICD-10-CM | POA: Diagnosis not present

## 2017-07-25 DIAGNOSIS — D696 Thrombocytopenia, unspecified: Secondary | ICD-10-CM | POA: Diagnosis present

## 2017-07-25 DIAGNOSIS — I480 Paroxysmal atrial fibrillation: Secondary | ICD-10-CM | POA: Diagnosis not present

## 2017-07-25 DIAGNOSIS — I130 Hypertensive heart and chronic kidney disease with heart failure and stage 1 through stage 4 chronic kidney disease, or unspecified chronic kidney disease: Secondary | ICD-10-CM | POA: Diagnosis not present

## 2017-07-25 DIAGNOSIS — R159 Full incontinence of feces: Secondary | ICD-10-CM | POA: Diagnosis present

## 2017-07-25 DIAGNOSIS — D649 Anemia, unspecified: Secondary | ICD-10-CM | POA: Diagnosis present

## 2017-07-25 DIAGNOSIS — Z9183 Wandering in diseases classified elsewhere: Secondary | ICD-10-CM

## 2017-07-25 DIAGNOSIS — Z7401 Bed confinement status: Secondary | ICD-10-CM | POA: Diagnosis not present

## 2017-07-25 DIAGNOSIS — K5900 Constipation, unspecified: Secondary | ICD-10-CM | POA: Diagnosis present

## 2017-07-25 DIAGNOSIS — R739 Hyperglycemia, unspecified: Secondary | ICD-10-CM | POA: Diagnosis present

## 2017-07-25 DIAGNOSIS — K219 Gastro-esophageal reflux disease without esophagitis: Secondary | ICD-10-CM | POA: Diagnosis present

## 2017-07-25 DIAGNOSIS — Z96611 Presence of right artificial shoulder joint: Secondary | ICD-10-CM | POA: Diagnosis present

## 2017-07-25 DIAGNOSIS — E785 Hyperlipidemia, unspecified: Secondary | ICD-10-CM | POA: Diagnosis present

## 2017-07-25 DIAGNOSIS — I5032 Chronic diastolic (congestive) heart failure: Secondary | ICD-10-CM | POA: Diagnosis not present

## 2017-07-25 DIAGNOSIS — M2550 Pain in unspecified joint: Secondary | ICD-10-CM | POA: Diagnosis not present

## 2017-07-25 DIAGNOSIS — M1990 Unspecified osteoarthritis, unspecified site: Secondary | ICD-10-CM | POA: Diagnosis present

## 2017-07-25 DIAGNOSIS — R079 Chest pain, unspecified: Secondary | ICD-10-CM | POA: Diagnosis present

## 2017-07-25 DIAGNOSIS — I25119 Atherosclerotic heart disease of native coronary artery with unspecified angina pectoris: Secondary | ICD-10-CM | POA: Diagnosis not present

## 2017-07-25 DIAGNOSIS — Z7951 Long term (current) use of inhaled steroids: Secondary | ICD-10-CM

## 2017-07-25 DIAGNOSIS — R262 Difficulty in walking, not elsewhere classified: Secondary | ICD-10-CM | POA: Diagnosis not present

## 2017-07-25 DIAGNOSIS — Z7189 Other specified counseling: Secondary | ICD-10-CM

## 2017-07-25 DIAGNOSIS — Z8042 Family history of malignant neoplasm of prostate: Secondary | ICD-10-CM

## 2017-07-25 DIAGNOSIS — B0229 Other postherpetic nervous system involvement: Secondary | ICD-10-CM | POA: Diagnosis present

## 2017-07-25 DIAGNOSIS — R0602 Shortness of breath: Secondary | ICD-10-CM | POA: Diagnosis not present

## 2017-07-25 DIAGNOSIS — Z825 Family history of asthma and other chronic lower respiratory diseases: Secondary | ICD-10-CM

## 2017-07-25 DIAGNOSIS — F419 Anxiety disorder, unspecified: Secondary | ICD-10-CM | POA: Diagnosis not present

## 2017-07-25 DIAGNOSIS — Z9861 Coronary angioplasty status: Secondary | ICD-10-CM

## 2017-07-25 DIAGNOSIS — Z87891 Personal history of nicotine dependence: Secondary | ICD-10-CM

## 2017-07-25 DIAGNOSIS — J309 Allergic rhinitis, unspecified: Secondary | ICD-10-CM | POA: Diagnosis present

## 2017-07-25 DIAGNOSIS — I252 Old myocardial infarction: Secondary | ICD-10-CM

## 2017-07-25 DIAGNOSIS — Z79899 Other long term (current) drug therapy: Secondary | ICD-10-CM

## 2017-07-25 DIAGNOSIS — F411 Generalized anxiety disorder: Secondary | ICD-10-CM | POA: Diagnosis present

## 2017-07-25 DIAGNOSIS — B029 Zoster without complications: Secondary | ICD-10-CM

## 2017-07-25 LAB — BASIC METABOLIC PANEL
Anion gap: 7 (ref 5–15)
BUN: 14 mg/dL (ref 6–20)
CO2: 25 mmol/L (ref 22–32)
CREATININE: 1.49 mg/dL — AB (ref 0.61–1.24)
Calcium: 9.1 mg/dL (ref 8.9–10.3)
Chloride: 105 mmol/L (ref 101–111)
GFR calc Af Amer: 48 mL/min — ABNORMAL LOW (ref >60)
GFR, EST NON AFRICAN AMERICAN: 42 mL/min — AB (ref >60)
Glucose, Bld: 133 mg/dL — ABNORMAL HIGH (ref 65–99)
POTASSIUM: 3.9 mmol/L (ref 3.5–5.1)
SODIUM: 137 mmol/L (ref 135–145)

## 2017-07-25 LAB — TROPONIN I
Troponin I: 1.85 ng/mL (ref <0.03)
Troponin I: 7.36 ng/mL (ref <0.03)

## 2017-07-25 LAB — HEMOGLOBIN A1C
HEMOGLOBIN A1C: 5.5 % (ref 4.8–5.6)
MEAN PLASMA GLUCOSE: 111.15 mg/dL

## 2017-07-25 LAB — CBC
HEMATOCRIT: 40.6 % (ref 39.0–52.0)
Hemoglobin: 12.9 g/dL — ABNORMAL LOW (ref 13.0–17.0)
MCH: 30.9 pg (ref 26.0–34.0)
MCHC: 31.8 g/dL (ref 30.0–36.0)
MCV: 97.4 fL (ref 78.0–100.0)
PLATELETS: 154 10*3/uL (ref 150–400)
RBC: 4.17 MIL/uL — ABNORMAL LOW (ref 4.22–5.81)
RDW: 14.2 % (ref 11.5–15.5)
WBC: 12.6 10*3/uL — AB (ref 4.0–10.5)

## 2017-07-25 LAB — I-STAT TROPONIN, ED: TROPONIN I, POC: 0.01 ng/mL (ref 0.00–0.08)

## 2017-07-25 MED ORDER — FENTANYL CITRATE (PF) 100 MCG/2ML IJ SOLN
25.0000 ug | Freq: Once | INTRAMUSCULAR | Status: AC
  Administered 2017-07-25: 25 ug via INTRAVENOUS
  Filled 2017-07-25: qty 2

## 2017-07-25 MED ORDER — SODIUM CHLORIDE 0.9 % IV BOLUS
500.0000 mL | Freq: Once | INTRAVENOUS | Status: AC
  Administered 2017-07-25: 500 mL via INTRAVENOUS

## 2017-07-25 MED ORDER — HEPARIN SODIUM (PORCINE) 5000 UNIT/ML IJ SOLN
5000.0000 [IU] | Freq: Three times a day (TID) | INTRAMUSCULAR | Status: DC
  Administered 2017-07-25: 5000 [IU] via SUBCUTANEOUS
  Filled 2017-07-25: qty 1

## 2017-07-25 MED ORDER — ALPRAZOLAM 0.25 MG PO TABS: 0.2500 mg | ORAL_TABLET | Freq: Two times a day (BID) | ORAL | Status: DC

## 2017-07-25 MED ORDER — ACETAMINOPHEN 325 MG PO TABS: 650.0000 mg | ORAL_TABLET | ORAL | Status: DC | PRN

## 2017-07-25 MED ORDER — ASPIRIN EC 81 MG PO TBEC
81.0000 mg | DELAYED_RELEASE_TABLET | Freq: Every day | ORAL | Status: DC
  Administered 2017-07-26 – 2017-08-01 (×7): 81 mg via ORAL
  Filled 2017-07-25 (×7): qty 1

## 2017-07-25 MED ORDER — DONEPEZIL HCL 10 MG PO TABS
10.0000 mg | ORAL_TABLET | Freq: Every day | ORAL | Status: DC
  Administered 2017-07-25 – 2017-07-31 (×7): 10 mg via ORAL
  Filled 2017-07-25 (×7): qty 1

## 2017-07-25 MED ORDER — HEPARIN (PORCINE) IN NACL 100-0.45 UNIT/ML-% IJ SOLN
950.0000 [IU]/h | INTRAMUSCULAR | Status: DC
  Administered 2017-07-25: 800 [IU]/h via INTRAVENOUS
  Filled 2017-07-25: qty 250

## 2017-07-25 MED ORDER — SODIUM CHLORIDE 0.9 % IV SOLN
INTRAVENOUS | Status: DC
  Administered 2017-07-25 – 2017-07-28 (×2): via INTRAVENOUS

## 2017-07-25 MED ORDER — DOXEPIN HCL 10 MG PO CAPS
10.0000 mg | ORAL_CAPSULE | Freq: Every day | ORAL | Status: DC
  Administered 2017-07-25 – 2017-07-31 (×6): 10 mg via ORAL
  Filled 2017-07-25 (×8): qty 1

## 2017-07-25 MED ORDER — HEPARIN BOLUS VIA INFUSION
4000.0000 [IU] | Freq: Once | INTRAVENOUS | Status: AC
  Administered 2017-07-25: 4000 [IU] via INTRAVENOUS
  Filled 2017-07-25: qty 4000

## 2017-07-25 MED ORDER — ONDANSETRON HCL 4 MG/2ML IJ SOLN: 4.0000 mg | Freq: Four times a day (QID) | INTRAMUSCULAR | Status: DC | PRN

## 2017-07-25 MED ORDER — NITROGLYCERIN 0.4 MG SL SUBL: 0.4000 mg | SUBLINGUAL_TABLET | SUBLINGUAL | Status: DC | PRN

## 2017-07-25 MED ORDER — GUAIFENESIN ER 600 MG PO TB12
600.0000 mg | ORAL_TABLET | Freq: Two times a day (BID) | ORAL | Status: DC
  Administered 2017-07-25 – 2017-08-01 (×14): 600 mg via ORAL
  Filled 2017-07-25 (×14): qty 1

## 2017-07-25 MED ORDER — ALPRAZOLAM 0.25 MG PO TABS
0.2500 mg | ORAL_TABLET | Freq: Two times a day (BID) | ORAL | Status: DC | PRN
  Administered 2017-07-25 – 2017-07-31 (×7): 0.25 mg via ORAL
  Filled 2017-07-25 (×7): qty 1

## 2017-07-25 NOTE — Progress Notes (Signed)
ANTICOAGULATION CONSULT NOTE - Initial Consult  Pharmacy Consult for Heparin  Indication: chest pain/ACS  No Known Allergies  Patient Measurements: Height: 5\' 10"  (177.8 cm) Weight: 147 lb (66.7 kg) IBW/kg (Calculated) : 73  Vital Signs: Temp: 98.7 F (37.1 C) (06/20 2011) Temp Source: Oral (06/20 2011) BP: 124/77 (06/20 2011) Pulse Rate: 88 (06/20 2011)  Labs: Recent Labs    07/25/17 0930 07/25/17 1616  HGB 12.9*  --   HCT 40.6  --   PLT 154  --   CREATININE 1.49*  --   TROPONINI  --  1.85*    Estimated Creatinine Clearance: 35.4 mL/min (A) (by C-G formula based on SCr of 1.49 mg/dL (H)).   Medical History: Past Medical History:  Diagnosis Date  . Allergic rhinitis   . Arthritis   . Constipation    takes Miralax daily as needed  . Coronary artery disease    s/p remote IWMI 2004 with PCI of the RCA with BMS and repeat cath 2007 due to recurrent CP and abnormal nuclear stress test and cath showed widely patent RCA stent with 30-50% distal stenosis before the takeoff of a PL and PDA and normal LVF.   [name]. Depression with anxiety    takes Clonazepam nightly  . GERD (gastroesophageal reflux disease)    takes Omeprazole and Pepcid daily  . Heart attack (HCC)   . History of bronchitis    pulmonologist is Dr.Wert.  . Hyperlipidemia    takes Atorvastatin daily  . Hypertension    takes Diovan daily  . Joint pain   . Memory loss   . RLS (restless legs syndrome)   . Unspecified vitamin D deficiency   . Vitamin B 12 deficiency     Assessment: 84 yo male admitted for ACS, started on a heparin drip  Goal of Therapy:  Heparin level 0.3-0.7 units/ml Monitor platelets by anticoagulation protocol: Yes   Plan:  Give 4000 units bolus x 1, followed by a heparin infusion of 800 units/hr Draw a f/u HL in 6 hours and daily Monitor H and H and plts  Tera Materatherine A Vedika Dumlao, PharmD, [facility] 07/25/2017,8:24 PM

## 2017-07-25 NOTE — Progress Notes (Addendum)
Called by nurse re: troponin 1.85. Initial plan was for stress test unless enzymes turn positive, then cath would be considered. Patient has dementia and has required re-direction and re-insertion of IVs as he has pulled out 2 already. Will start heparin per pharmacy given + trop. Continue to cycle enzymes. Nurse states patient is pain free at present time and he will continue to be monitored for recurrent pain.  Team will need to decide in AM regarding definitive plan for cath. OP notes indicate considerable worsening with behavioral issues back in May. I updated wife by phone. Plan reviewed with Dr. Purvis SheffieldKoneswaran (cardiologist on call with me) who agrees.  Dayna Dunn PA-C   Addendum: received page that patient has been more agitated as night has gone on. Safety sitter ordered. He is on doxepin, Xanax and Namenda which have not been ordered here - have placed order for the doxepin and xanax. Have requested nurse touch base with pharmacy to clarify dose of Namenda (not clear on intake MAR) and reorder at home dose. I also wrote care order to also get help from pharmacy regarding appropriate substitution for Stiolto as Epic is not allowing me to order their recommended sub in the interface. Dayna Dunn PA-C

## 2017-07-25 NOTE — ED Provider Notes (Signed)
[facility] EMERGENCY DEPARTMENT Provider Note   CSN: [id number] Arrival date & time: 07/25/17  0913     History   Chief Complaint Chief Complaint  Patient presents with  . Chest Pain    HPI [name] is a 84 y.o. male with a hx of CAD, HTN, hyperlipidemia, memory loss, and COPD who presents to the ED with complaints of chest pain that started around 0800 this AM. Patient states that he woke up and developed substernal chest discomfort which he describes as pressure. This has been constant since onset. States it is severe and rates it a 7/10 in severity, no specific alleviating/aggravating factors. He is unsure if the pain was worse with exertion. Reports associated nausea and dyspnea. States this feels similar to previous MI in 2004. Denies syncope, cough, vomiting, or leg pain/swelling. He states he has not been having problems with chest pain over the past week or so, today was first time.   Per chart review:  s/p remote IWMI 2004 with PCI of the RCA with BMS and repeat cath 2007 due to recurrent CP and abnormal nuclear stress test and cath showed widely patent RCA stent with 30-50% distal stenosis before the takeoff of a PL and PDA and normal LVF.  Had Myocardial Perfusion Imaging in March 2016:, unable to view interpretation/results.   HPI  Past Medical History:  Diagnosis Date  . Allergic rhinitis   . Arthritis   . Constipation    takes Miralax daily as needed  . Coronary artery disease    s/p remote IWMI 2004 with PCI of the RCA with BMS and repeat cath 2007 due to recurrent CP and abnormal nuclear stress test and cath showed widely patent RCA stent with 30-50% distal stenosis before the takeoff of a PL and PDA and normal LVF.   [name] Depression with anxiety    takes Clonazepam nightly  . GERD (gastroesophageal reflux disease)    takes Omeprazole and Pepcid daily  . Heart attack (HCC)   . History of bronchitis    pulmonologist is Dr.Wert.  .  Hyperlipidemia    takes Atorvastatin daily  . Hypertension    takes Diovan daily  . Joint pain   . Memory loss   . RLS (restless legs syndrome)   . Unspecified vitamin D deficiency   . Vitamin B 12 deficiency     Patient Active Problem List   Diagnosis Date Noted  . Dementia 03/07/2015  . Fall (on) (from) other stairs and steps, sequela 03/07/2015  . S/P shoulder replacement 04/29/2014  . Essential hypertension 03/11/2014  . Mild cognitive impairment 03/12/2013  . Anxiety 03/12/2013  . CELLULITIS AND ABSCESS OF UPPER ARM AND FOREARM 07/05/2009  . Dyslipidemia 03/21/2007  . MI, old 03/21/2007  . ALLERGIC RHINITIS 03/21/2007  . COUGH 01/14/2007  . CAD (coronary artery disease), native coronary artery 01/13/2007  . CHRONIC RHINITIS 01/13/2007  . COPD pfts pending  01/13/2007  . Esophageal reflux 01/13/2007  . DYSPNEA 01/13/2007    Past Surgical History:  Procedure Laterality Date  . CARDIAC CATHETERIZATION  2004  . cataract surgery    . COLONOSCOPY    . CORONARY ANGIOPLASTY     1 stent  . ESOPHAGOGASTRODUODENOSCOPY    . HAND SURGERY Bilateral   . HERNIA REPAIR Left    inguinal   . NASAL SINUS SURGERY    . REVERSE SHOULDER ARTHROPLASTY Right 04/29/2014   Procedure: REVERSE TOTAL SHOULDER ARTHROPLASTY ;  Surgeon: Francena Hanly, MD;  Location: [facility] OR;  Service: Orthopedics;  Laterality: Right;  . stents          Home Medications    Prior to Admission medications   Medication Sig Start Date End Date Taking? Authorizing Provider  donepezil (ARICEPT) 10 MG tablet Take 1 tablet (10 mg total) by mouth at bedtime. Start 1/2 tablet daily x 4 weeks then 1 tablet daily 02/19/17   [name], Pramod S, MD  doxepin (SINEQUAN) 10 MG capsule Take 10 mg by mouth at bedtime.    [provider]  fluticasone (FLONASE) 50 MCG/ACT nasal spray Place 1 spray into both nostrils 2 (two) times daily. Reported on 03/07/2015    [provider]  memantine [facility](NAMENDA TITRATION PAK)  tablet pack 5 mg/day for =1 week; 5 mg twice daily for =1 week; 15 mg/day given in 5 mg and 10 mg separated doses for =1 week; then 10 mg twice daily 06/17/17   George HughVanschaick, Jessica, NP  tiotropium (SPIRIVA) 18 MCG inhalation capsule Place 1 capsule (18 mcg total) into inhaler and inhale daily. 03/31/16   [name], Erin O, PA-C    Family History Family History  Problem Relation Age of Onset  . Asthma Mother   . Cancer - Prostate Father     Social History Social History   Tobacco Use  . Smoking status: Former Smoker    Packs/day: 3.00    Years: 15.00    Pack years: 45.00    Types: Cigarettes    Last attempt to quit: 02/06/1964    Years since quitting: 53.5  . Smokeless tobacco: Never Used  Substance Use Topics  . Alcohol use: No    Alcohol/week: 0.0 oz  . Drug use: No     Allergies   Patient has no known allergies.   Review of Systems Review of Systems  Constitutional: Negative for chills and fever.  Respiratory: Positive for shortness of breath.   Cardiovascular: Positive for chest pain. Negative for leg swelling.  Gastrointestinal: Positive for nausea. Negative for vomiting.  Neurological: Negative for dizziness, syncope, weakness, light-headedness and numbness.  All other systems reviewed and are negative.    Physical Exam Updated Vital Signs BP 102/73 (BP Location: Left Arm)   Pulse 81   Temp 97.6 F (36.4 C) (Oral)   Resp 16   Ht 5\' 10"  (1.778 m)   Wt 66.7 kg (147 lb)   SpO2 98%   BMI 21.09 kg/m   Physical Exam  Constitutional: He appears well-developed and well-nourished. No distress.  HENT:  Head: Normocephalic and atraumatic.  Eyes: Conjunctivae are normal. Right eye exhibits no discharge. Left eye exhibits no discharge.  Cardiovascular: Normal rate and regular rhythm.  No murmur heard. Pulses:      Radial pulses are 2+ on the right side, and 2+ on the left side.       Posterior tibial pulses are 2+ on the right side, and 2+ on the left side.    Pulmonary/Chest: Effort normal. No respiratory distress. He has no wheezes. He has no rales.  Abdominal: Soft. He exhibits no distension. There is no tenderness.  Musculoskeletal:       Right lower leg: He exhibits no tenderness and no edema.       Left lower leg: He exhibits no tenderness and no edema.  Neurological: He is alert.  Clear speech.   Skin: Skin is warm and dry. No rash noted.  Psychiatric: He has a normal mood and affect. His behavior is normal.  Nursing note and vitals reviewed.   ED Treatments / Results  Labs Results for orders placed or performed during the hospital encounter of 07/25/17  Basic metabolic panel  Result Value Ref Range   Sodium 137 135 - 145 mmol/L   Potassium 3.9 3.5 - 5.1 mmol/L   Chloride 105 101 - 111 mmol/L   CO2 25 22 - 32 mmol/L   Glucose, Bld 133 (H) 65 - 99 mg/dL   BUN 14 6 - 20 mg/dL   Creatinine, Ser 1.61 (H) 0.61 - 1.24 mg/dL   Calcium 9.1 8.9 - 09.6 mg/dL   GFR calc non Af Amer 42 (L) >60 mL/min   GFR calc Af Amer 48 (L) >60 mL/min   Anion gap 7 5 - 15  CBC  Result Value Ref Range   WBC 12.6 (H) 4.0 - 10.5 K/uL   RBC 4.17 (L) 4.22 - 5.81 MIL/uL   Hemoglobin 12.9 (L) 13.0 - 17.0 g/dL   HCT 04.5 40.9 - 81.1 %   MCV 97.4 78.0 - 100.0 fL   MCH 30.9 26.0 - 34.0 pg   MCHC 31.8 30.0 - 36.0 g/dL   RDW 91.4 78.2 - 95.6 %   Platelets 154 150 - 400 K/uL  I-stat troponin, ED  Result Value Ref Range   Troponin i, poc 0.01 0.00 - 0.08 ng/mL   Comment 3           EKG EKG Interpretation  Date/Time:  Thursday July 25 2017 09:22:23 EDT Ventricular Rate:  60 PR Interval:  176 QRS Duration: 106 QT Interval:  406 QTC Calculation: 406 R Axis:   -23 Text Interpretation:  Normal sinus rhythm Nonspecific ST and T wave abnormality Abnormal ECG new twi lateral compared with prior 4/16 Confirmed by Meridee Score [phone]) on 07/25/2017 9:46:37 AM   Radiology Dg Chest 2 View  Result Date: 07/25/2017 CLINICAL DATA:  Chest pain.  Shortness of  breath. EXAM: CHEST - 2 VIEW COMPARISON:  09/29/2015. FINDINGS: Frontal view was obtained in an AP apical lordotic projection. Prominence again noted of the ascending thoracic aorta consistent with the aneurysm. This is most likely stable given projection. Stable cardiomegaly. Lungs are clear. No pleural effusion or pneumothorax. Diffuse thoracic spine osteopenia degenerative change. Stable mild midthoracic vertebral body compression fracture. Left eighth posterior rib fracture. Some degree of callus formation noted suggesting this is old. Right shoulder replacement appears stable. IMPRESSION: 1. Prominence again noted of the ascending thoracic aorta consistent with thoracic aortic aneurysm. Similar findings noted on prior exam. Stable cardiomegaly. No acute pulmonary disease. 2. Stable midthoracic vertebral body compression fracture. Left eighth posterior rib fracture with some degree of callus formation suggesting that this is old. Electronically Signed   By: Maisie Fus  Register   On: 07/25/2017 10:15    Procedures Procedures (including critical care time)  Medications Ordered in ED Medications - No data to display   Initial Impression / Assessment and Plan / ED Course  I have reviewed the triage vital signs and the nursing notes.  Pertinent labs & imaging results that were available during my care of the patient were reviewed by me and considered in my medical decision making (see chart for details).  Clinical Course as of Jul 25 1333  Thu Jul 25, 2017  [id number] 84 year old male here with chest pain with a strong prior cardiac history.  For strep negative.  Cardiology has been consulted and will evaluate the patient in the ED for help with disposition.   [MB]  Clinical Course User Index [MB] Terrilee Files, MD   Patient is an 84 year old male who presents to the ED with chest pain. Per chart review:  He is s/p remote IWMI 2004 with PCI of the RCA with BMS and repeat cath 2007 due to recurrent  CP and abnormal nuclear stress test and cath showed widely patent RCA stent with 30-50% distal stenosis before the takeoff of a PL and PDA and normal LVF. He states this feels similar to previus MI in 2004. Fairly benign physical exam: DDX: ACS, pulmonary embolism, dissection, pneumothorax, pneumonia, pleural effusion.   Labs reviewed: Patient has baseline anemia, unchanged, hyperglycemia at 133, creatinine 1.49 which appears consistent with previous, and nonspecific leukocytosis. CXR with prominence again noted of the ascending thoracic aorta consistent with thoracic aortic aneurysm. Similar findings noted on prior exam. Stable cardiomegaly. No acute pulmonary disease. Stable midthoracic vertebral body compression fracture. Left eighth posterior rib fracture with some degree of callus formation suggesting that this is old. EKG with nonspecific ST/T waves with new lateral T wave inversions in comparison to previous in 2016, initial troponin WNL. Given patient's history and presentation feel that patient requires admission for chest pain. Given borderline blood pressures will give 500ccs of fluids and trial 25 mcg fentanyl for pain, NTG avoided secondary to blood pressure.  Findings and plan of care discussed with supervising physician Dr. Charm Barges who personally evaluated and examined this patient and is in agreement with plan.   11:17: CONSULT: Discussed case with cardiology, will come to evaluate the patient in the ER.   01:35: CONSULT: Discussed case with Laverda Page, NP-C with cardiology who has admitted patient.   Final Clinical Impressions(s) / ED Diagnoses   Final diagnoses:  Chest pain, unspecified type    ED Discharge Orders    None       Cherly Anderson, PA-C 07/25/17 1339    Terrilee Files, MD 07/26/17 [phone]

## 2017-07-25 NOTE — ED Triage Notes (Signed)
Pt arrives POV from home where he began having chest pain 1 hour ago. Pt called EMS but cmae POV. Pt c/o continuous chest pain 7/10 . Hx of MI in past.

## 2017-07-25 NOTE — Progress Notes (Signed)
Upon entering room, found IV on bedside table.  This is the 2nd IV that the patient has removed today.  Reinserted a third IV and wrapped it with Gauze.

## 2017-07-25 NOTE — ED Notes (Signed)
Su Hiltoberts, NP paged to [phone] Petrucelli, PA-paged by Marylene LandAngela

## 2017-07-25 NOTE — Progress Notes (Signed)
CRITICAL VALUE ALERT  Critical Value:  Troponin 1.85  Date & Time Notied:  07/25/2017 1845  Provider Notified: PA Dunn  Orders Received/Actions taken: Awaiting further orders.

## 2017-07-25 NOTE — ED Notes (Signed)
ED Provider at bedside. 

## 2017-07-25 NOTE — Progress Notes (Signed)
Per ED RN report, patient has been disoriented since receiving Fentanyl earlier today.  Patient's spouse states he does not tolerate any narcotics and becomes confused.  Patient keeps removing his telemetry monitor and stickers.  Attempting to redirect, but within minutes he begins pulling off the monitor.

## 2017-07-25 NOTE — H&P (Addendum)
History & Physical    Patient ID: [name] MRN: [id number], DOB/AGE: 11-03-1931   Admit date: 07/25/2017   Primary Physician: Georgianne Fick, MD Primary Cardiologist: Dr. Mayford Knife  Patient Profile    84 yo male with PMH of CAD s/p PCI of RCA ('04), HTN, HL, GERD who presented with chest pain.   Past Medical History   Past Medical History:  Diagnosis Date  . Allergic rhinitis   . Arthritis   . Constipation    takes Miralax daily as needed  . Coronary artery disease    s/p remote IWMI 2004 with PCI of the RCA with BMS and repeat cath 2007 due to recurrent CP and abnormal nuclear stress test and cath showed widely patent RCA stent with 30-50% distal stenosis before the takeoff of a PL and PDA and normal LVF.   [name] Depression with anxiety    takes Clonazepam nightly  . GERD (gastroesophageal reflux disease)    takes Omeprazole and Pepcid daily  . Heart attack (HCC)   . History of bronchitis    pulmonologist is Dr.Wert.  . Hyperlipidemia    takes Atorvastatin daily  . Hypertension    takes Diovan daily  . Joint pain   . Memory loss   . RLS (restless legs syndrome)   . Unspecified vitamin D deficiency   . Vitamin B 12 deficiency     Past Surgical History:  Procedure Laterality Date  . CARDIAC CATHETERIZATION  2004  . cataract surgery    . COLONOSCOPY    . CORONARY ANGIOPLASTY     1 stent  . ESOPHAGOGASTRODUODENOSCOPY    . HAND SURGERY Bilateral   . HERNIA REPAIR Left    inguinal   . NASAL SINUS SURGERY    . REVERSE SHOULDER ARTHROPLASTY Right 04/29/2014   Procedure: REVERSE TOTAL SHOULDER ARTHROPLASTY ;  Surgeon: Francena Hanly, MD;  Location: [facility] OR;  Service: Orthopedics;  Laterality: Right;  . stents       Allergies  No Known Allergies  History of Present Illness    [name] is an 84 year old male with past medical history of CAD status post PCI of the RCA 2004, hypertension, hyperlipidemia, GERD.  He had an abnormal stress test back in 2007, and  underwent cardiac cath which showed patent RCA stent, with distal in-stent restenosis of 30 to 50%.  He was last seen in the office by Dr. Mayford Knife in 2016 preoperatively for shoulder replacement.  Underwent stress test which was low risk, and cleared for shoulder surgery at that time.  He has not been seen since this last visit.  Currently lives at home with his wife.  Appears he was seen by neurology back in January of this year.  Was noted to have been hospitalized in [place] back in December when he had a fall.  There was also concern for memory loss, and he was started on Aricept at this visit.  Talking with the patient he reports having onset of centralized chest pressure this morning with mild associated shortness of breath.  No nausea, vomiting, diaphoresis or radiation of pain.  He did not take anything for his symptoms prior to presenting to the ER.  Was brought to the ED by his family.  At the time of arrival he was pain-free.  States that his episode this morning lasted about 10 minutes.  In the ED his labs showed stable electrolytes, creatinine 1.4, POC troponin negative, hemoglobin 12.9.  Chest x-ray with no acute edema,  stable cardiomegaly and a mid thoracic vertebral compression fracture which appears to be chronic.  EKG showed sinus rhythm with no acute ST/T wave abnormalities.  Cardiovascular ROS: positive for - chest pain, shortness of breath and Nausea and sweating negative for - dyspnea on exertion, irregular heartbeat, loss of consciousness, murmur, orthopnea, palpitations, paroxysmal nocturnal dyspnea or rapid heart rate   Home Medications    Prior to Admission medications   Medication Sig Start Date End Date Taking? Authorizing Provider  ALPRAZolam (XANAX) 0.25 MG tablet Take 0.25 mg by mouth 2 (two) times daily. 07/03/17  Yes [provider]  chlorpheniramine (ALLERGY) 4 MG tablet Take 4 mg by mouth 2 (two) times daily as needed for allergies.   Yes [provider]  donepezil (ARICEPT) 10 MG tablet Take 1 tablet (10 mg total) by mouth at bedtime. Start 1/2 tablet daily x 4 weeks then 1 tablet daily 02/19/17  Yes Micki Riley, MD  doxepin (SINEQUAN) 10 MG capsule Take 10 mg by mouth at bedtime.   Yes [provider]  fluticasone (FLONASE) 50 MCG/ACT nasal spray Place 1 spray into both nostrils 2 (two) times daily. Reported on 03/07/2015   Yes [provider]  guaiFENesin (MUCINEX) 600 MG 12 hr tablet Take 600 mg by mouth 2 (two) times daily.   Yes [provider]  memantine (NAMENDA TITRATION PAK) tablet pack 5 mg/day for =1 week; 5 mg twice daily for =1 week; 15 mg/day given in 5 mg and 10 mg separated doses for =1 week; then 10 mg twice daily 06/17/17  Yes George Hugh, NP  Multiple Vitamin (MULTIVITAMIN WITH MINERALS) TABS tablet Take 1 tablet by mouth daily.   Yes [provider]  Tiotropium Bromide-Olodaterol (STIOLTO RESPIMAT) 2.5-2.5 MCG/ACT AERS Inhale 2 puffs into the lungs at bedtime.   Yes [provider]  tiotropium (SPIRIVA) 18 MCG inhalation capsule Place 1 capsule (18 mcg total) into inhaler and inhale daily. Patient not taking: Reported on 07/25/2017 03/31/16   Rolla Plate    Family History    Family History  Problem Relation Age of Onset  . Asthma Mother   . Cancer - Prostate Father     Social History    Social History   Tobacco Use  . Smoking status: Former Smoker    Packs/day: 3.00    Years: 15.00    Pack years: 45.00    Types: Cigarettes    Last attempt to quit: 02/06/1964    Years since quitting: 53.5  . Smokeless tobacco: Never Used  Substance Use Topics  . Alcohol use: No    Alcohol/week: 0.0 oz  . Drug use: No   Social History   Social History Narrative   Patient lives at home with his wife.      Review of Systems    See HPI I usually just day to like 530 just in case of needing help me not ask my wife at 6 right now saying 68 my  daughter is 7 but was way home with his normal home like All other systems reviewed and are otherwise negative except as noted above.  Physical Exam    Blood pressure 103/79, pulse (!) 53, temperature 97.6 F (36.4 C), temperature source Oral, resp. rate 13, height 5\' 10"  (1.778 m), weight 147 lb (66.7 kg), SpO2 96 %.  General: Pleasant, older thin white male, NAD Psych: Normal affect.  Very poor memory.  Had to really pull questions out of him.  Neuro: Alert and oriented X 3. Moves all extremities spontaneously. HEENT: Normal  Neck: Supple, mild JVD. Lungs:  Resp regular and unlabored, CTA. Heart: RRR no s3, s4, or murmurs. Abdomen: Soft, non-tender, non-distended, BS + x 4.  Extremities: No clubbing, cyanosis or edema. DP/PT/Radials 2+ and equal bilaterally.  Labs    Troponin [facility] of Care Test) Recent Labs    07/25/17 0950  TROPIPOC 0.01   No results for input(s): CKTOTAL, CKMB, TROPONINI in the last 72 hours. Lab Results  Component Value Date   WBC 12.6 (H) 07/25/2017   HGB 12.9 (L) 07/25/2017   HCT 40.6 07/25/2017   MCV 97.4 07/25/2017   PLT 154 07/25/2017    Recent Labs  Lab 07/25/17 0930  NA 137  K 3.9  CL 105  CO2 25  BUN 14  CREATININE 1.49*  CALCIUM 9.1  GLUCOSE 133*   Lab Results  Component Value Date   CHOL 178 04/08/2014   HDL 40.90 04/08/2014   LDLCALC 107 (H) 04/08/2014   TRIG 152.0 (H) 04/08/2014   No results found for: [facility]   Radiology Studies    Dg Chest 2 View  Result Date: 07/25/2017 CLINICAL DATA:  Chest pain.  Shortness of breath. EXAM: CHEST - 2 VIEW COMPARISON:  09/29/2015. FINDINGS: Frontal view was obtained in an AP apical lordotic projection. Prominence again noted of the ascending thoracic aorta consistent with the aneurysm. This is most likely stable given projection. Stable cardiomegaly. Lungs are clear. No pleural effusion or pneumothorax. Diffuse thoracic spine osteopenia degenerative change. Stable mild midthoracic  vertebral body compression fracture. Left eighth posterior rib fracture. Some degree of callus formation noted suggesting this is old. Right shoulder replacement appears stable. IMPRESSION: 1. Prominence again noted of the ascending thoracic aorta consistent with thoracic aortic aneurysm. Similar findings noted on prior exam. Stable cardiomegaly. No acute pulmonary disease. 2. Stable midthoracic vertebral body compression fracture. Left eighth posterior rib fracture with some degree of callus formation suggesting that this is old. Electronically Signed   By: Maisie Fus  Register   On: 07/25/2017 10:15    ECG & Cardiac Imaging    EKG: Sinus rhythm with no acute ST/T wave abnormalities  Assessment & Plan    84 yo male with PMH of CAD s/p PCI of RCA ('04), HTN, HL, GERD who presented with chest pain.  1.  Chest pain -with moderate risk for cardiac etiology: Reports sudden onset of chest pain that lasts about 10 minutes earlier this morning prompting his presentation to the ER.  Initial POC troponin negative, EKG nonischemic.  Has been chest pain-free while in the ED. -Admit to telemetry -Cycle troponins -Plan for stress test in the a.m., unless troponins turn positive, then will need to proceed with cath. -Check echo -Continue home medications with PRN nitroglycerin  2.  CAD s/p PCI of the RCA: This was done back in 2004, with repeat cath in 2007 showing mild in-stent restenosis distally.  3.  HTN: Does not appear that he is currently on any antihypertensives at home.  Blood pressure stable in the ER.  4.  HL: Reported history of the same, but does not appear to be on a statin at home. -Check lipids in the a.m.  5.  Memory loss: We will continue Aricept 10 mg daily.   Severity of Illness: The appropriate patient status for this patient is OBSERVATION. Observation status is judged to be reasonable and necessary in order to provide the required intensity of service to ensure  the patient's safety.  The patient's presenting symptoms, physical exam findings, and initial radiographic and laboratory data in the context of their medical condition is felt to place them at decreased risk for further clinical deterioration. Furthermore, it is anticipated that the patient will be medically stable for discharge from the hospital within 2 midnights of admission. The following factors support the patient status of observation.   " The patient's presenting symptoms include chest pain. " The physical exam findings include normal exam. " The initial radiographic and laboratory data are stable EKG.    Janice CoffinSigned, Lindsay Roberts, NP-C Pager [phone] 07/25/2017, 11:50 AM    I have seen, examined and evaluated the patient this AM along with Laverda PageLindsay Roberts, NP-C.  After reviewing all the available data and chart, we discussed the patients laboratory, study & physical findings as well as symptoms in detail. I agree with her findings, examination as well as impression recommendations as per our discussion.    Attending adjustments noted in italics.   Somewhat pleasantly demented elderly gentleman with known history of coronary disease who presents with a decent story of at least maybe 10 minutes of substernal chest pain but is relatively comfortable now.  Pain was gone before he got to the emergency room.  He described it as a central chest tightness and dyspnea with some nausea.  It is at least moderate if not high risk for a cardiac etiology, however my inclination would be more toward an early conservative evaluation with rule out MI and stress test.  He has not had a stress test for 3 years.  He really is not on much of anything from home medicine standpoint. No antihypertensives, no statin (I agree with his dementia that that is probably not a good idea). Will hold off on any IV heparin unless he has positive troponins. Observation to rule out MI and check stress test tomorrow.   Bryan Lemmaavid Idonia Zollinger, M.D.,  M.S. Interventional Cardiologist   Pager # [phone] Phone # [phone] [address] [place], [place] [address]

## 2017-07-25 NOTE — ED Notes (Signed)
Pt pulled IV out and was getting dressed "ready to go" EDP to bedside. Pt reassured to stay in hospital.

## 2017-07-25 NOTE — ED Notes (Signed)
Patient transported to X-ray 

## 2017-07-26 ENCOUNTER — Observation Stay (HOSPITAL_BASED_OUTPATIENT_CLINIC_OR_DEPARTMENT_OTHER): Payer: Medicare HMO

## 2017-07-26 DIAGNOSIS — G2581 Restless legs syndrome: Secondary | ICD-10-CM | POA: Diagnosis not present

## 2017-07-26 DIAGNOSIS — F0390 Unspecified dementia without behavioral disturbance: Secondary | ICD-10-CM | POA: Diagnosis not present

## 2017-07-26 DIAGNOSIS — J449 Chronic obstructive pulmonary disease, unspecified: Secondary | ICD-10-CM | POA: Diagnosis not present

## 2017-07-26 DIAGNOSIS — I351 Nonrheumatic aortic (valve) insufficiency: Secondary | ICD-10-CM

## 2017-07-26 DIAGNOSIS — Z8619 Personal history of other infectious and parasitic diseases: Secondary | ICD-10-CM

## 2017-07-26 DIAGNOSIS — R159 Full incontinence of feces: Secondary | ICD-10-CM

## 2017-07-26 DIAGNOSIS — R262 Difficulty in walking, not elsewhere classified: Secondary | ICD-10-CM | POA: Diagnosis not present

## 2017-07-26 DIAGNOSIS — I214 Non-ST elevation (NSTEMI) myocardial infarction: Principal | ICD-10-CM

## 2017-07-26 DIAGNOSIS — N39498 Other specified urinary incontinence: Secondary | ICD-10-CM

## 2017-07-26 DIAGNOSIS — R410 Disorientation, unspecified: Secondary | ICD-10-CM

## 2017-07-26 DIAGNOSIS — F0151 Vascular dementia with behavioral disturbance: Secondary | ICD-10-CM

## 2017-07-26 LAB — BASIC METABOLIC PANEL
Anion gap: 9 (ref 5–15)
BUN: 17 mg/dL (ref 6–20)
CO2: 22 mmol/L (ref 22–32)
CREATININE: 1.33 mg/dL — AB (ref 0.61–1.24)
Calcium: 8.8 mg/dL — ABNORMAL LOW (ref 8.9–10.3)
Chloride: 109 mmol/L (ref 101–111)
GFR calc Af Amer: 55 mL/min — ABNORMAL LOW (ref >60)
GFR, EST NON AFRICAN AMERICAN: 48 mL/min — AB (ref >60)
GLUCOSE: 123 mg/dL — AB (ref 65–99)
Potassium: 3.8 mmol/L (ref 3.5–5.1)
SODIUM: 140 mmol/L (ref 135–145)

## 2017-07-26 LAB — ECHOCARDIOGRAM COMPLETE
HEIGHTINCHES: 70 in
WEIGHTICAEL: 2371.2 [oz_av]

## 2017-07-26 LAB — CBC
HCT: 38.5 % — ABNORMAL LOW (ref 39.0–52.0)
HEMOGLOBIN: 12.4 g/dL — AB (ref 13.0–17.0)
MCH: 30.9 pg (ref 26.0–34.0)
MCHC: 32.2 g/dL (ref 30.0–36.0)
MCV: 96 fL (ref 78.0–100.0)
PLATELETS: 126 10*3/uL — AB (ref 150–400)
RBC: 4.01 MIL/uL — AB (ref 4.22–5.81)
RDW: 14.2 % (ref 11.5–15.5)
WBC: 10.3 10*3/uL (ref 4.0–10.5)

## 2017-07-26 LAB — LIPID PANEL
CHOL/HDL RATIO: 2.6 ratio
CHOLESTEROL: 126 mg/dL (ref 0–200)
HDL: 49 mg/dL (ref >40)
LDL Cholesterol: 71 mg/dL (ref 0–99)
Triglycerides: 30 mg/dL (ref <150)
VLDL: 6 mg/dL (ref 0–40)

## 2017-07-26 LAB — TROPONIN I: TROPONIN I: 8.08 ng/mL — AB (ref <0.03)

## 2017-07-26 LAB — HEPARIN LEVEL (UNFRACTIONATED): HEPARIN UNFRACTIONATED: 0.26 [IU]/mL — AB (ref 0.30–0.70)

## 2017-07-26 MED ORDER — ARFORMOTEROL TARTRATE 15 MCG/2ML IN NEBU
15.0000 ug | INHALATION_SOLUTION | Freq: Two times a day (BID) | RESPIRATORY_TRACT | Status: DC
  Administered 2017-07-26 – 2017-08-01 (×12): 15 ug via RESPIRATORY_TRACT
  Filled 2017-07-26 (×11): qty 2

## 2017-07-26 MED ORDER — GUAIFENESIN-DM 100-10 MG/5ML PO SYRP
15.0000 mL | ORAL_SOLUTION | ORAL | Status: DC | PRN
  Administered 2017-07-26 – 2017-07-29 (×2): 15 mL via ORAL
  Filled 2017-07-26 (×2): qty 15

## 2017-07-26 MED ORDER — MEMANTINE HCL 5 MG PO TABS
10.0000 mg | ORAL_TABLET | Freq: Two times a day (BID) | ORAL | Status: DC
  Administered 2017-07-26 – 2017-08-01 (×13): 10 mg via ORAL
  Filled 2017-07-26 (×13): qty 2

## 2017-07-26 MED ORDER — ENOXAPARIN SODIUM 60 MG/0.6ML ~~LOC~~ SOLN
60.0000 mg | Freq: Two times a day (BID) | SUBCUTANEOUS | Status: DC
  Administered 2017-07-26 – 2017-07-27 (×2): 60 mg via SUBCUTANEOUS
  Filled 2017-07-26 (×2): qty 0.6

## 2017-07-26 MED ORDER — UMECLIDINIUM BROMIDE 62.5 MCG/INH IN AEPB
1.0000 | INHALATION_SPRAY | Freq: Every day | RESPIRATORY_TRACT | Status: DC
  Administered 2017-07-26 – 2017-08-01 (×7): 1 via RESPIRATORY_TRACT
  Filled 2017-07-26: qty 7

## 2017-07-26 NOTE — Progress Notes (Signed)
  Echocardiogram 2D Echocardiogram has been performed.  Delcie RochENNINGTON, Ambri Miltner 07/26/2017, 9:20 AM

## 2017-07-26 NOTE — Progress Notes (Signed)
PT Cancellation Note  Patient Details Name: [name] MRN: [id number] DOB: 05/18/1931   Cancelled Treatment:    Reason Eval/Treat Not Completed: Other (comment) Pt has been agitated and is currently sleeping. Family requesting to allow pt to sleep. Will follow up as schedule allows.   Gladys DammeBrittany Alexsia Klindt, PT, DPT  Acute Rehabilitation Services  Pager: [phone]  [name] 07/26/2017, 4:33 PM

## 2017-07-26 NOTE — Progress Notes (Signed)
ANTICOAGULATION CONSULT NOTE Pharmacy Consult for Heparin Indication: chest pain/ACS  No Known Allergies  Patient Measurements: Height: 5\' 10"  (177.8 cm) Weight: 147 lb (66.7 kg) IBW/kg (Calculated) : 73  Vital Signs: Temp: 98.7 F (37.1 C) (06/20 2011) Temp Source: Oral (06/20 2011) BP: 124/77 (06/20 2011) Pulse Rate: 88 (06/20 2011)  Labs: Recent Labs    07/25/17 0930 07/25/17 1616 07/25/17 2120 07/26/17 0429  HGB 12.9*  --   --  12.4*  HCT 40.6  --   --  38.5*  PLT 154  --   --  126*  HEPARINUNFRC  --   --   --  0.26*  CREATININE 1.49*  --   --   --   TROPONINI  --  1.85* 7.36*  --     Estimated Creatinine Clearance: 35.4 mL/min (A) (by C-G formula based on SCr of 1.49 mg/dL (H)).  Assessment: 84 y.o. male with chest pain for heparin   Goal of Therapy:  Heparin level 0.3-0.7 units/ml Monitor platelets by anticoagulation protocol: Yes   Plan:  Increase Heparin 950 units/hr  Meenakshi Sazama, Gary FleetGregory Vernon 07/26/2017,5:55 AM

## 2017-07-26 NOTE — Care Management Obs Status (Signed)
MEDICARE OBSERVATION STATUS NOTIFICATION   Patient Details  Name: Douglas PentonRalph A Coreas MRN: [id number] Date of Birth: 04/06/1931   Medicare Observation Status Notification Given:  Yes    Gala LewandowskyGraves-Bigelow, Solace Manwarren Kaye, RN 07/26/2017, 4:22 PM

## 2017-07-26 NOTE — Plan of Care (Signed)
  Problem: Health Behavior/Discharge Planning: Goal: Ability to manage health-related needs will improve Outcome: Progressing   Problem: Clinical Measurements: Goal: Will remain free from infection Outcome: Progressing   Problem: Activity: Goal: Risk for activity intolerance will decrease Outcome: Progressing   Problem: Coping: Goal: Level of anxiety will decrease Outcome: Progressing   Problem: Elimination: Goal: Will not experience complications related to bowel motility Outcome: Progressing Goal: Will not experience complications related to urinary retention Outcome: Progressing   Problem: Pain Managment: Goal: General experience of comfort will improve Outcome: Progressing   Problem: Safety: Goal: Ability to remain free from injury will improve Outcome: Progressing   Problem: Skin Integrity: Goal: Risk for impaired skin integrity will decrease Outcome: Progressing

## 2017-07-26 NOTE — Clinical Social Work Note (Addendum)
Clinical Social Work Assessment  Patient Details  Name: [name] MRN: [id number] Date of Birth: 1931/12/16  Date of referral:  07/26/17               Reason for consult:  Facility Placement, Discharge Planning                Permission sought to share information with:  Facility Sport and exercise psychologist, Family Supports Permission granted to share information::  No(patient has dementia, with varying orientation; wife at bedside)  Name::     Mora Bellman  Agency::  SNFs  Relationship::  spouse  Contact Information:  [phone]  Housing/Transportation Living arrangements for the past 2 months:  Single Family Home Source of Information:  Spouse Patient Interpreter Needed:  None Criminal Activity/Legal Involvement Pertinent to Current Situation/Hospitalization:  No - Comment as needed Significant Relationships:  Spouse Lives with:  Siblings Do you feel safe going back to the place where you live?  Yes Need for family participation in patient care:  Yes (Comment)  Care giving concerns: Patient from home with wife. Wife requesting assistance with placement in long term skilled nursing facility.   Social Worker assessment / plan: CSW met with wife, Caryl Asp, in conference room; she had requested not to discuss disposition planning with patient present. [name] reported patient was diagnosed with dementia several years ago and she has been his sole caregiver since then. [name] feels patient needs long term care placement in a skilled nursing facility. She has started the process of applying for Medicaid. [name] called patient's Medicaid case worker during meeting- case worker indicated patient needs to re-start application because they did not submit all documents in time when they last applied. CSW provided [name] with new Medicaid application; CSW will assist in faxing to social services office when she is finished.   CSW explained process of referrals to SNF and the need to get [facility]  authorization prior to patient admitting, as that is the only active insurance coverage he currently has. Patient will admit to SNF under rehab benefit until able to transition to long term care. CSW to support in identifying facility in network with [employer] who may also have a long term bed for patient to transition to once approved for Medicaid.   CSW awaiting PT/OT evaluations and will send SNF referrals. Patient will require [facility] prior to admitting to facility. CSW will follow and support with disposition planning.  Update 4:26 pm: Family requested to speak to CSW again this afternoon. Met with wife and daughter in law in family meeting room. Family seeking clarification on process for placing patient in SNF. CSW explained referral and insurance authorization process. Again clarified for wife that if patient is to enter SNF directly from the hospital this admission, he will have to enter under his [facility] Medicare benefit for rehab, while they await approval for patient's Medicaid, which may take several weeks. Wife to complete Medicaid application this weekend and CSW can assist in sending application to DSS on Monday. CSW faxed out intial SNF referrals, though still awaiting PT/OT evaluations, which will be needed before starting an insurance authorization for facility. CSW to follow.  Employment status:  Retired Forensic scientist:  Managed Medicare([employer]) PT Recommendations:  Not assessed at this time Information / Referral to community resources:  [place]  Patient/Family's Response to care: Wife appreciative of care.  Patient/Family's Understanding of and Emotional Response to Diagnosis, Current Treatment, and Prognosis: Wife with understanding of patient's conditions and  hopeful for long term care SNF placement.  Emotional Assessment Appearance:  Appears stated age Attitude/Demeanor/Rapport:  Unable to Assess Affect (typically observed):  Unable to  Assess Orientation:  Oriented to Self, Oriented to Place Alcohol / Substance use:  Not Applicable Psych involvement (Current and /or in the community):  No (Comment)  Discharge Needs  Concerns to be addressed:  Discharge Planning Concerns, Care Coordination Readmission within the last 30 days:  No Current discharge risk:  Physical Impairment, Cognitively Impaired Barriers to Discharge:  Continued Medical Work up   Estanislado Emms, LCSW 07/26/2017, 12:22 PM

## 2017-07-26 NOTE — Consult Note (Signed)
Renal Service Consult Note [facility]  [name] 07/26/2017 [name] Requesting Physician:  Douglas. Herbie [name]  Reason for Consult:  Altered mental status HPI: The patient is a 84 y.o. year-old with hx of CAD/ MI w cor stent, depression /anxiety hyperlipidemia hypertension and memory loss/ progressive dementia admitted on 6/20 yesterday with chest pain.  Initial trop was negative but subsequent tests were + (0.901 > 1.85 > 7.3 > 8.08).  Decision made for medical Rx, getting IV heparin but has pulled out IV's twice now per family.  Today was confused, we are asked to see for AMS/ dementia.    Patient w/ long hx of cognitive loss progressing to dementia.  Has seen Douglas [name] since 2015 for this, was followed initially treated with Cerefolin and fish oil.  Memory worsened and he was started on Aricept , dementia improved for a bit then worsened again.  It has continued to worsen and recently in May 2019 was started on Namenda as a 2nd agent.  He recently became incontinent as well.  Family can't handle patient at home anymore, he takes his diapers off and soils the floors.  Wife has been caring for him but unable to at this point, they are requesting facility placement.  SW here is working on this now.  PT eval is pending.    Patient is oriented to "hospital". He denies any neck pain.  He recently had shingles of the R neck wrapping around the R upper chest per family.     ROS  denies CP  no joint pain   no HA  no blurry vision  no rash  no diarrhea  no nausea/ vomiting  no dysuria  no difficulty voiding  no change in urine color    Past Medical History  Past Medical History:  Diagnosis Date  . Allergic rhinitis   . Arthritis   . Constipation    takes Miralax daily as needed  . Coronary artery disease    s/p remote IWMI 2004 with PCI of the RCA with BMS and repeat cath 2007 due to recurrent CP and abnormal nuclear stress test and cath showed widely patent RCA  stent with 30-50% distal stenosis before the takeoff of a PL and PDA and normal LVF.   [name] Depression with anxiety    takes Clonazepam nightly  . GERD (gastroesophageal reflux disease)    takes Omeprazole and Pepcid daily  . Heart attack (HCC)   . History of bronchitis    pulmonologist is Douglas.Wert.  . Hyperlipidemia    takes Atorvastatin daily  . Hypertension    takes Diovan daily  . Joint pain   . Memory loss   . RLS (restless legs syndrome)   . Unspecified vitamin D deficiency   . Vitamin B 12 deficiency    Past Surgical History  Past Surgical History:  Procedure Laterality Date  . CARDIAC CATHETERIZATION  2004  . cataract surgery    . COLONOSCOPY    . CORONARY ANGIOPLASTY     1 stent  . ESOPHAGOGASTRODUODENOSCOPY    . HAND SURGERY Bilateral   . HERNIA REPAIR Left    inguinal   . NASAL SINUS SURGERY    . REVERSE SHOULDER ARTHROPLASTY Right 04/29/2014   Procedure: REVERSE TOTAL SHOULDER ARTHROPLASTY ;  Surgeon: Francena Hanly, MD;  Location: [facility] OR;  Service: Orthopedics;  Laterality: Right;  . stents     Family History  Family History  Problem Relation Age of Onset  .  Asthma Mother   . Cancer - Prostate Father    Social History  reports that he quit smoking about 53 years ago. His smoking use included cigarettes. He has a 45.00 pack-year smoking history. He has never used smokeless tobacco. He reports that he does not drink alcohol or use drugs. Allergies No Known Allergies Home medications Prior to Admission medications   Medication Sig Start Date End Date Taking? Authorizing Provider  ALPRAZolam (XANAX) 0.25 MG tablet Take 0.25 mg by mouth 2 (two) times daily. 07/03/17  Yes [provider]  chlorpheniramine (ALLERGY) 4 MG tablet Take 4 mg by mouth 2 (two) times daily as needed for allergies.   Yes [provider]  donepezil (ARICEPT) 10 MG tablet Take 1 tablet (10 mg total) by mouth at bedtime. Start 1/2 tablet daily x 4 weeks then 1 tablet daily 02/19/17   Yes [name], Pramod S, MD  doxepin (SINEQUAN) 10 MG capsule Take 10 mg by mouth at bedtime.   Yes [provider]  fluticasone (FLONASE) 50 MCG/ACT nasal spray Place 1 spray into both nostrils 2 (two) times daily. Reported on 03/07/2015   Yes [provider]  guaiFENesin (MUCINEX) 600 MG 12 hr tablet Take 600 mg by mouth 2 (two) times daily.   Yes [provider]  memantine (NAMENDA TITRATION PAK) tablet pack 5 mg/day for =1 week; 5 mg twice daily for =1 week; 15 mg/day given in 5 mg and 10 mg separated doses for =1 week; then 10 mg twice daily 06/17/17  Yes George HughVanschaick, Jessica, NP  Multiple Vitamin (MULTIVITAMIN WITH MINERALS) TABS tablet Take 1 tablet by mouth daily.   Yes [provider]  Tiotropium Bromide-Olodaterol (STIOLTO RESPIMAT) 2.5-2.5 MCG/ACT AERS Inhale 2 puffs into the lungs at bedtime.   Yes [provider]  tiotropium (SPIRIVA) 18 MCG inhalation capsule Place 1 capsule (18 mcg total) into inhaler and inhale daily. Patient not taking: Reported on 07/25/2017 03/31/16   [name], Erin O, PA-C   Liver Function Tests No results for input(s): AST, ALT, ALKPHOS, BILITOT, PROT, ALBUMIN in the last 168 hours. No results for input(s): LIPASE, AMYLASE in the last 168 hours. CBC Recent Labs  Lab 07/25/17 0930 07/26/17 0429  WBC 12.6* 10.3  HGB 12.9* 12.4*  HCT 40.6 38.5*  MCV 97.4 96.0  PLT 154 126*   Basic Metabolic Panel Recent Labs  Lab 07/25/17 0930 07/26/17 0429  NA 137 140  K 3.9 3.8  CL 105 109  CO2 25 22  GLUCOSE 133* 123*  BUN 14 17  CREATININE 1.49* 1.33*  CALCIUM 9.1 8.8*   Iron/TIBC/Ferritin/ %Sat No results found for: IRON, TIBC, FERRITIN, IRONPCTSAT  Vitals:   07/25/17 2011 07/26/17 0636 07/26/17 0955 07/26/17 1300  BP: 124/77 92/66 (!) 95/59 97/70  Pulse: 88 87 72 79  Resp: 18 17 18 20   Temp: 98.7 F (37.1 C) 98.4 F (36.9 C) 98.2 F (36.8 C) 98.4 F (36.9 C)  TempSrc: Oral Oral Oral Oral  SpO2: 99% 95% 97%  97%  Weight:  67.2 kg (148 lb 3.2 oz)    Height:       Exam Gen alert, no distress, confused No rash, cyanosis or gangrene Sclera anicteric, throat clear and mois  No jvd or bruits Chest dec'd BS/ rales L base, R clear RRR no MRG  Abd soft ntnd no mass or ascites +bs GU normal male MS no joint effusions or deformity Ext no leg or UE edema, no wounds or ulcers Neuro  is alert, Ox 1.5 , not to time/ date/ day of week, knows in hospital, nonfocal    Impression: 1  Dementia - progressive since 2015, worsening now at home with incontinence or urine/ stool.  Family concerned about some threatening statements patient has made towards the wife as well, but he has not been physically violent.  Pt improved on Aricept in 2016 but then has continued to progress and started on memantine in May this year.  Overall prognosis is poor, they are requesting facility placement.  They are open to DNR status and will discuss amongst themselves.  They are also open to discussing options such as hospice Rx.  Will consult pall care.  Would not treat w/ sedatives , would continue w/ a sitter as best option for confusion now.  Have d/w son and wife about possibly limiting hospital interventions/ admissions to limit patient's suffering. They will d/w pall care team.   2  R chest/ neck shingles - minimal remaining rash there on exam, no open blisters, would not recommend any Rx for this.   3  Acute NSTEMI - medical Rx, per cardiology 4  HTN 5  CAD hx MI/ stent to RCA (2004)   Plan - as above  Vinson Moselle MD [facility] pager [phone]   07/26/2017, 2:45 PM

## 2017-07-26 NOTE — Progress Notes (Signed)
Pt had episode of hematuria. On call notified and order for UA/culture obtained. Will continue to monitor.

## 2017-07-26 NOTE — Progress Notes (Signed)
ANTICOAGULATION CONSULT NOTE Pharmacy Consult for Heparin to Lovenox Indication: chest pain/ACS  No Known Allergies  Patient Measurements: Height: 5\' 10"  (177.8 cm) Weight: 148 lb 3.2 oz (67.2 kg) IBW/kg (Calculated) : 73  Vital Signs: Temp: 98.4 F (36.9 C) (06/21 1300) Temp Source: Oral (06/21 1300) BP: 97/70 (06/21 1300) Pulse Rate: 79 (06/21 1300)  Labs: Recent Labs    07/25/17 0930 07/25/17 1616 07/25/17 2120 07/26/17 0429  HGB 12.9*  --   --  12.4*  HCT 40.6  --   --  38.5*  PLT 154  --   --  126*  HEPARINUNFRC  --   --   --  0.26*  CREATININE 1.49*  --   --  1.33*  TROPONINI  --  1.85* 7.36* 8.08*    Estimated Creatinine Clearance: 40 mL/min (A) (by C-G formula based on SCr of 1.33 mg/dL (H)).  Assessment: 84 y.o. male with chest pain transitioning to Lovenox  Goal of Therapy:  Monitor platelets by anticoagulation protocol: Yes   Plan:  DC heparin and heparin labs Lovenox 60 mg sq Q 12 hours Follow up CBC  Thank you Okey RegalLisa Kano Heckmann, PharmD [phone] 07/26/2017,2:11 PM

## 2017-07-26 NOTE — Progress Notes (Addendum)
Progress Note  Patient Name: [name] Date of Encounter: 07/26/2017  Primary Cardiologist: Remotely Dr. Radford Pax  Subjective   Patient is confused this morning. Thinks he's at a church. Denies chest pain today, however wife at bedside reports that he complained of chest pain when she got to the hospital this morning. Wife also reports that he had a recent episode of shingles along jaw line and he has complained of ear and jaw pain since that time.   Inpatient Medications    Scheduled Meds: . arformoterol  15 mcg Nebulization BID   And  . umeclidinium bromide  1 puff Inhalation Daily  . aspirin EC  81 mg Oral Daily  . donepezil  10 mg Oral QHS  . doxepin  10 mg Oral QHS  . guaiFENesin  600 mg Oral BID  . memantine  10 mg Oral BID   Continuous Infusions: . sodium chloride 10 mL/hr at 07/25/17 2201  . heparin 950 Units/hr (07/26/17 0604)   PRN Meds: acetaminophen, ALPRAZolam, guaiFENesin-dextromethorphan, nitroGLYCERIN, ondansetron (ZOFRAN) IV   Vital Signs    Vitals:   07/25/17 1520 07/25/17 2011 07/26/17 0636 07/26/17 0955  BP: 119/68 124/77 92/66 (!) 95/59  Pulse: 61 88 87 72  Resp:  _0 Temp: 98.2 F (36.8 C) 98.7 F (37.1 C) 98.4 F (36.9 C) 98.2 F (36.8 C)  TempSrc: Oral Oral Oral Oral  SpO2: 99% 99% 95% 97%  Weight:   148 lb 3.2 oz (67.2 kg)   Height:        Intake/Output Summary (Last 24 hours) at 07/26/2017 1150 Last data filed at 07/25/2017 2300 Gross per 24 hour  Intake 240 ml  Output -  Net 240 ml   Filed Weights   07/25/17 0925 07/26/17 0636  Weight: 147 lb (66.7 kg) 148 lb 3.2 oz (67.2 kg)    Telemetry    Sinus rhythm with frequent ectopy - Personally Reviewed  Physical Exam   GEN: Thin elderly gentleman laying in bed in no acute distress.   Neck: No JVD, no carotid bruits Cardiac: RRR, no murmurs, rubs, or gallops.  Respiratory: Clear to auscultation bilaterally, no wheezes/ rales/ rhonchi GI: NABS, Soft, nontender,  non-distended  MS: No edema; No deformity. Neuro:  Nonfocal, moving all extremities spontaneously Psych: Normal affect   Labs    Chemistry Recent Labs  Lab 07/25/17 0930 07/26/17 0429  NA 137 140  K 3.9 3.8  CL 105 109  CO2 25 22  GLUCOSE 133* 123*  BUN 14 17  CREATININE 1.49* 1.33*  CALCIUM 9.1 8.8*  GFRNONAA 42* 48*  GFRAA 48* 55*  ANIONGAP 7 9     Hematology Recent Labs  Lab 07/25/17 0930 07/26/17 0429  WBC 12.6* 10.3  RBC 4.17* 4.01*  HGB 12.9* 12.4*  HCT 40.6 38.5*  MCV 97.4 96.0  MCH 30.9 30.9  MCHC 31.8 32.2  RDW 14.2 14.2  PLT 154 126*    Cardiac Enzymes Recent Labs  Lab 07/25/17 1616 07/25/17 2120 07/26/17 0429  TROPONINI 1.85* 7.36* 8.08*    Recent Labs  Lab 07/25/17 0950  TROPIPOC 0.01     BNPNo results for input(s): BNP, PROBNP in the last 168 hours.   DDimer No results for input(s): DDIMER in the last 168 hours.   Radiology    Dg Chest 2 View  Result Date: 07/25/2017 CLINICAL DATA:  Chest pain.  Shortness of breath. EXAM: CHEST - 2 VIEW COMPARISON:  09/29/2015. FINDINGS: Frontal view was  obtained in an AP apical lordotic projection. Prominence again noted of the ascending thoracic aorta consistent with the aneurysm. This is most likely stable given projection. Stable cardiomegaly. Lungs are clear. No pleural effusion or pneumothorax. Diffuse thoracic spine osteopenia degenerative change. Stable mild midthoracic vertebral body compression fracture. Left eighth posterior rib fracture. Some degree of callus formation noted suggesting this is old. Right shoulder replacement appears stable. IMPRESSION: 1. Prominence again noted of the ascending thoracic aorta consistent with thoracic aortic aneurysm. Similar findings noted on prior exam. Stable cardiomegaly. No acute pulmonary disease. 2. Stable midthoracic vertebral body compression fracture. Left eighth posterior rib fracture with some degree of callus formation suggesting that this is old.  Electronically Signed   By: Marcello Moores  Register   On: 07/25/2017 10:15    Cardiac Studies   Echocardiogram 07/26/17: Study Conclusions  - Procedure narrative: Transthoracic echocardiography. Image   quality was poor. The study was technically difficult. - Left ventricle: The cavity size was normal. Wall thickness was   increased in a pattern of mild LVH. Systolic function was normal.   The estimated ejection fraction was in the range of 55% to 60%.   There is akinesis of the basal-midinferior myocardium. Doppler   parameters are consistent with abnormal left ventricular   relaxation (grade 1 diastolic dysfunction). - Aortic valve: There was mild regurgitation. - Aorta: Ascending aortic diameter: 41 mm (S). - Ascending aorta: The ascending aorta was mildly dilated.  Patient Profile     84 yo male with PMH of CAD s/p PCI of RCA ('04), HTN, HL, GERD, and dementia who presented with chest pain. Admitted to cardiology for NSTEMI.   Assessment & Plan    1. NSTEMI in patient with known CAD: patient reported sudden onset chest pain on the morning of 07/25/17 which lasted for 10 minutes. No clear recurrence of chest pain since admission. Trop trend: 0.01>1.85>7.36>8.08. EKG without ischemic changes. He was started on a heparin gtt. After discussion with wife, given his functional decline and worsening dementia, decision made to pursue medical management at this time.  - Will transition from heparin gtt to therapeutic lovenox to minimize interruptions/lines - Continue ASA - Would benefit from addition of Bblocker, although limited by BP. If BP improves, recommend starting coreg 3.112m BID.   2. Post herpetic neuralgia: wife reports an episode of shingles along jaw line ~2 weeks ago. Lesions have healed but patient has been complaining about jaw/ear pain since that time - Will ask Medicine to evaluate patient and assist with management  3. Dementia: wife reports recent decline in mental status  and difficulty caring for him at home given behavioral issues. SW met with her this admission to discuss long term care options. He had an episode of sundowning yesterday evening for which he received home xanax. - Will ask Medicine to evaluate patient and assist with management - Continue donepezil, doxepin, memantine, and prn xanax for now  4. HTN: BP has been on the soft side and patient does not take medications at home - Continue to monitor  5. HLD: LDL 71. Not on statin therapy - Continue routine monitoring  For questions or updates, please contact CFultonPlease consult [email or web address] for contact info under Cardiology/STEMI.      Signed, KAbigail Butts PA-C  07/26/2017, 11:50 AM   [phone]  I have seen, examined and evaluated the patient this AM along with KAbigail Butts PA-C .  After reviewing all the available data and chart, we  discussed the patients laboratory, study & physical findings as well as symptoms in detail. I agree with her findings, examination as well as impression recommendations as per our discussion.    Very difficult scenario.  I cannot tell if he is having any more chest pain.  Unfortunately he is ruled in for non-ST elevation MI.  I had a long discussion today with his wife who is very hard of hearing.  I truly think that the best course of action here is to treat medically and avoid any invasive procedures.  I am fearful of taking this gentleman to the Cath Lab and having him confused and started thrashing around on the bed as he did last night with a catheter in place.  The safest option will be to actually switch him to subcu Lovenox (to avoid IV heparin) and continue low-dose beta-blocker.  He is on several medications for his dementia, however with sundowning at night, we have asked internal medicine to assist. I am also concerned about his postherpetic neuralgia that he is complaining of.  Overall plan would be to monitor--for 48-72  hours of  anticoagulation for ACS.  Consider adding Imdur.  At this time would not plan to proceed with any invasive evaluation based on his level of dementia.  I did ask the wife to have discussions with family to determine his CODE STATUS.   Glenetta Hew, M.D., M.S. Interventional Cardiologist   Pager # [phone] Phone # [phone] [address] [place], [place] [address]

## 2017-07-27 ENCOUNTER — Other Ambulatory Visit: Payer: Self-pay

## 2017-07-27 DIAGNOSIS — D649 Anemia, unspecified: Secondary | ICD-10-CM | POA: Diagnosis present

## 2017-07-27 DIAGNOSIS — I480 Paroxysmal atrial fibrillation: Secondary | ICD-10-CM | POA: Diagnosis not present

## 2017-07-27 DIAGNOSIS — K219 Gastro-esophageal reflux disease without esophagitis: Secondary | ICD-10-CM | POA: Diagnosis present

## 2017-07-27 DIAGNOSIS — Z7189 Other specified counseling: Secondary | ICD-10-CM

## 2017-07-27 DIAGNOSIS — D696 Thrombocytopenia, unspecified: Secondary | ICD-10-CM | POA: Diagnosis present

## 2017-07-27 DIAGNOSIS — R739 Hyperglycemia, unspecified: Secondary | ICD-10-CM | POA: Diagnosis present

## 2017-07-27 DIAGNOSIS — J449 Chronic obstructive pulmonary disease, unspecified: Secondary | ICD-10-CM | POA: Diagnosis present

## 2017-07-27 DIAGNOSIS — Z9183 Wandering in diseases classified elsewhere: Secondary | ICD-10-CM | POA: Diagnosis not present

## 2017-07-27 DIAGNOSIS — B0229 Other postherpetic nervous system involvement: Secondary | ICD-10-CM | POA: Diagnosis present

## 2017-07-27 DIAGNOSIS — I214 Non-ST elevation (NSTEMI) myocardial infarction: Secondary | ICD-10-CM | POA: Diagnosis present

## 2017-07-27 DIAGNOSIS — Z515 Encounter for palliative care: Secondary | ICD-10-CM | POA: Diagnosis not present

## 2017-07-27 DIAGNOSIS — I200 Unstable angina: Secondary | ICD-10-CM

## 2017-07-27 DIAGNOSIS — F068 Other specified mental disorders due to known physiological condition: Secondary | ICD-10-CM | POA: Diagnosis present

## 2017-07-27 DIAGNOSIS — F0391 Unspecified dementia with behavioral disturbance: Secondary | ICD-10-CM | POA: Diagnosis present

## 2017-07-27 DIAGNOSIS — I130 Hypertensive heart and chronic kidney disease with heart failure and stage 1 through stage 4 chronic kidney disease, or unspecified chronic kidney disease: Secondary | ICD-10-CM | POA: Diagnosis present

## 2017-07-27 DIAGNOSIS — R079 Chest pain, unspecified: Secondary | ICD-10-CM | POA: Diagnosis present

## 2017-07-27 DIAGNOSIS — I2510 Atherosclerotic heart disease of native coronary artery without angina pectoris: Secondary | ICD-10-CM | POA: Diagnosis present

## 2017-07-27 DIAGNOSIS — F411 Generalized anxiety disorder: Secondary | ICD-10-CM

## 2017-07-27 DIAGNOSIS — N183 Chronic kidney disease, stage 3 (moderate): Secondary | ICD-10-CM | POA: Diagnosis present

## 2017-07-27 DIAGNOSIS — K5900 Constipation, unspecified: Secondary | ICD-10-CM | POA: Diagnosis present

## 2017-07-27 DIAGNOSIS — E785 Hyperlipidemia, unspecified: Secondary | ICD-10-CM | POA: Diagnosis present

## 2017-07-27 DIAGNOSIS — I5032 Chronic diastolic (congestive) heart failure: Secondary | ICD-10-CM | POA: Diagnosis present

## 2017-07-27 LAB — URINALYSIS, ROUTINE W REFLEX MICROSCOPIC
BACTERIA UA: NONE SEEN
BILIRUBIN URINE: NEGATIVE
Glucose, UA: NEGATIVE mg/dL
Ketones, ur: NEGATIVE mg/dL
LEUKOCYTES UA: NEGATIVE
NITRITE: NEGATIVE
PH: 5 (ref 5.0–8.0)
Protein, ur: NEGATIVE mg/dL
SPECIFIC GRAVITY, URINE: 1.023 (ref 1.005–1.030)

## 2017-07-27 LAB — CBC
HEMATOCRIT: 35.1 % — AB (ref 39.0–52.0)
Hemoglobin: 11.5 g/dL — ABNORMAL LOW (ref 13.0–17.0)
MCH: 31.2 pg (ref 26.0–34.0)
MCHC: 32.8 g/dL (ref 30.0–36.0)
MCV: 95.1 fL (ref 78.0–100.0)
Platelets: 104 10*3/uL — ABNORMAL LOW (ref 150–400)
RBC: 3.69 MIL/uL — ABNORMAL LOW (ref 4.22–5.81)
RDW: 14.2 % (ref 11.5–15.5)
WBC: 8.7 10*3/uL (ref 4.0–10.5)

## 2017-07-27 MED ORDER — HALOPERIDOL 2 MG PO TABS
1.0000 mg | ORAL_TABLET | Freq: Three times a day (TID) | ORAL | Status: DC | PRN
  Filled 2017-07-27: qty 0.5
  Filled 2017-07-27 (×2): qty 1

## 2017-07-27 MED ORDER — CARVEDILOL 3.125 MG PO TABS
3.1250 mg | ORAL_TABLET | Freq: Two times a day (BID) | ORAL | Status: DC
  Administered 2017-07-27 – 2017-08-01 (×10): 3.125 mg via ORAL
  Filled 2017-07-27 (×10): qty 1

## 2017-07-27 NOTE — Progress Notes (Addendum)
Subjective:  He is confused today and difficult to get history.  No complaints of chest pain.Had episode of hematuria last night.  Objective:  Vital Signs in the last 24 hours: BP 104/73 (BP Location: Left Arm)   Pulse 92   Temp 98.5 F (36.9 C) (Oral)   Resp 18   Ht 5\' 10"  (1.778 m)   Wt 67.2 kg (148 lb 3.2 oz)   SpO2 95%   BMI 21.26 kg/m   Physical Exam: Elderly male not currently agitated no complaints of chest pain lying quietly in bed Lungs:  Clear Cardiac:  Regular rhythm, normal S1 and S2, no S3 Extremities:  No edema present  Intake/Output from previous day: 06/21 0701 - 06/22 0700 In: 240 [P.O.:240] Out: 550 [Urine:550]  Weight Filed Weights   07/25/17 0925 07/26/17 0636  Weight: 66.7 kg (147 lb) 67.2 kg (148 lb 3.2 oz)    Lab Results: Basic Metabolic Panel: Recent Labs    07/25/17 0930 07/26/17 0429  NA 137 140  K 3.9 3.8  CL 105 109  CO2 25 22  GLUCOSE 133* 123*  BUN 14 17  CREATININE 1.49* 1.33*   CBC: Recent Labs    07/26/17 0429 07/27/17 0433  WBC 10.3 8.7  HGB 12.4* 11.5*  HCT 38.5* 35.1*  MCV 96.0 95.1  PLT 126* 104*   Cardiac Enzymes: Troponin (Point of Care Test) Recent Labs    07/25/17 0950  TROPIPOC 0.01   Cardiac Panel (last 3 results) Recent Labs    07/25/17 1616 07/25/17 2120 07/26/17 0429  TROPONINI 1.85* 7.36* 8.08*    Telemetry: Sinus tachycardia   Assessment/Plan:  1. CAD with non-STEMI Medical therapy planned 2.  Dementia  Has been seen by Child psychotherapistsocial worker and plans are in place for SNF 3.  Hyperlipidemia  Recommendations:  Internal medicine consult was appreciated.  He had some hematuria last night numbness so will stop his anticoagulation.Try to add low-dose beta blocker. He has some thrombocytopenia and would like to add Plavix but will hold off on that for the time being.  Hold off on heparin for the time being because of thrombocytopenia also.  Darden PalmerW. Spencer Idaliz Tinkle, Jr.  MD [facility] CenterFACC Cardiology  07/27/2017,  10:45 AM

## 2017-07-27 NOTE — Evaluation (Signed)
Physical Therapy Evaluation Patient Details Name: [name] MRN: [id number] DOB: 06/06/1931 Today's Date: 07/27/2017   History of Present Illness  84 yo male with PMH of dementia, CAD s/p PCI of RCA ('04), HTN, HL, GERD who presented with chest pain.   Clinical Impression  Pt admitted with above diagnosis. Pt currently with functional limitations due to the deficits listed below (see PT Problem List). Pt unreliable historian with hx of dementia. Pt reporting he ambulates without RW, upon eval pt very unsteady on his feet requiring min A at times to stabilize and use of RW. Path deviation and R lean noted without RW, high fall risk. Pt confused and not oriented this visit. Pt will benefit from skilled PT to increase their independence and safety with mobility to allow discharge to the venue listed below.       Follow Up Recommendations SNF;Supervision/Assistance - 24 hour    Equipment Recommendations  (TBD next venue)    Recommendations for Other Services       Precautions / Restrictions Precautions Precautions: Fall Restrictions Weight Bearing Restrictions: No      Mobility  Bed Mobility Overal bed mobility: Needs Assistance Bed Mobility: Supine to Sit;Sit to Supine     Supine to sit: Supervision Sit to supine: Supervision   General bed mobility comments: supervision for safety  Transfers Overall transfer level: Needs assistance Equipment used: Rolling walker (2 wheeled) Transfers: Sit to/from Stand Sit to Stand: Min assist         General transfer comment: Min A to steady in standing  Ambulation/Gait Ambulation/Gait assistance: Min assist;Min guard Gait Distance (Feet): 75 Feet Assistive device: Rolling walker (2 wheeled);None Gait Pattern/deviations: Step-to pattern;Step-through pattern Gait velocity: decreased   General Gait Details: Patient with unstable gait, hands on guarding, min A at times to stabilize. R lean noted. cues for proximity to RW,  patient unable to demonstrate consistency with. unsafe without RW, or guarding.   Stairs            Wheelchair Mobility    Modified Rankin (Stroke Patients Only)       Balance Overall balance assessment: Needs assistance Sitting-balance support: No upper extremity supported;Feet supported Sitting balance-Leahy Scale: Fair Sitting balance - Comments: able to maintain static sitting at EOB   Standing balance support: Bilateral upper extremity supported;During functional activity;No upper extremity supported Standing balance-Leahy Scale: Poor Standing balance comment: Reliant on physical A or UE support                             Pertinent Vitals/Pain Pain Assessment: No/denies pain    Home Living Family/patient expects to be discharged to:: Skilled nursing facility Living Arrangements: Spouse/significant other               Additional Comments: Pt reporting he lives with his wife in a two story home with bedroom on first floor. Unsure of reliability with baseline dementia.     Prior Function Level of Independence: Independent         Comments: Pt reporting he performs ADLs without assistance, does not use DME, and wife performs all IADLs. Unsure of reliability with baseline dementia     Hand Dominance   Dominant Hand: Right    Extremity/Trunk Assessment   Upper Extremity Assessment Upper Extremity Assessment: Defer to OT evaluation    Lower Extremity Assessment Lower Extremity Assessment: Difficult to assess due to impaired cognition;Generalized weakness    Cervical /  Trunk Assessment Cervical / Trunk Assessment: Normal  Communication   Communication: No difficulties  Cognition Arousal/Alertness: Awake/alert Behavior During Therapy: WFL for tasks assessed/performed Overall Cognitive Status: Within Functional Limits for tasks assessed                                        General Comments General comments (skin  integrity, edema, etc.): sitting present    Exercises     Assessment/Plan    PT Assessment Patient needs continued PT services  PT Problem List Decreased strength;Decreased range of motion;Decreased activity tolerance;Decreased balance;Decreased mobility;Decreased coordination;Decreased cognition       PT Treatment Interventions DME instruction;Stair training;Gait training;Functional mobility training;Therapeutic activities;Therapeutic exercise;Balance training    PT Goals (Current goals can be found in the Care Plan section)  Acute Rehab PT Goals Patient Stated Goal: Unstated PT Goal Formulation: With patient Time For Goal Achievement: 08/03/17 Potential to Achieve Goals: Fair    Frequency Min 2X/week   Barriers to discharge Decreased caregiver support      Co-evaluation               AM-PAC PT "6 Clicks" Daily Activity  Outcome Measure Difficulty turning over in bed (including adjusting bedclothes, sheets and blankets)?: A Little Difficulty moving from lying on back to sitting on the side of the bed? : A Little Difficulty sitting down on and standing up from a chair with arms (e.g., wheelchair, bedside commode, etc,.)?: A Little Help needed moving to and from a bed to chair (including a wheelchair)?: A Little Help needed walking in hospital room?: A Little Help needed climbing 3-5 steps with a railing? : A Little 6 Click Score: 18    End of Session Equipment Utilized During Treatment: Gait belt Activity Tolerance: Patient tolerated treatment well Patient left: in bed;with call bell/phone within reach;with nursing/sitter in room Nurse Communication: Mobility status PT Visit Diagnosis: Unsteadiness on feet (R26.81);Other abnormalities of gait and mobility (R26.89);Muscle weakness (generalized) (M62.81)    Time: 1610-9604 PT Time Calculation (min) (ACUTE ONLY): 20 min   Charges:   PT Evaluation $PT Eval Moderate Complexity: 1 Mod     PT G Codes:         Etta Grandchild, PT, DPT Acute Rehab Services Pager: [phone]    Etta Grandchild 07/27/2017, 3:17 PM

## 2017-07-27 NOTE — Progress Notes (Signed)
PROGRESS NOTE    [name]  ZOX:[id number] DOB: Dec 06, 1931 DOA: 07/25/2017 PCP: Georgianne Fick, MD    Brief Narrative:  84 y.o. year-old with hx of CAD/ MI w cor stent, depression /anxiety hyperlipidemia hypertension and memory loss/ progressive dementia admitted on 6/20 yesterday with chest pain.  Initial trop was negative but subsequent tests were + (0.901 > 1.85 > 7.3 > 8.08).  Decision made for medical Rx, getting IV heparin but has pulled out IV's twice now per family.  Today was confused, we are asked to see for AMS/ dementia.    Patient w/ long hx of cognitive loss progressing to dementia.  Has seen [name] since 2015 for this, was followed initially treated with Cerefolin and fish oil.  Memory worsened and he was started on Aricept , dementia improved for a bit then worsened again.  It has continued to worsen and recently in May 2019 was started on Namenda as a 2nd agent.  He recently became incontinent as well.  Family can't handle patient at home anymore, he takes his diapers off and soils the floors.  Wife has been caring for him but unable to at this point, they are requesting facility placement.  SW here is working on this now.  PT eval is pending.    Patient is oriented to "hospital". He denies any neck pain.  He recently had shingles of the R neck wrapping around the R upper chest per family  Assessment & Plan:   Active Problems:   Chest pain  1  Dementia -Noted to have progressive decline since 2015 -Concerns for rapidly worsning dementia. Agree that placement is appropriate in this situation -Overall poor prognosis with Palliative Care consulted -This AM, patient attempting to climb out of bed requiring sitter -EKG reviewed, QTc of 406 noted. Will give trial of low dose PRN haldol as needed   2  R chest/ neck shingles  - minimal remaining rash there on exam, no open blisters -Patient seems comfortable this AM. Would not recommend any Rx for this.    3   Acute NSTEMI - medical Rx, continue as per cardiology  4  HTN -BP stable and controlled at this time  5  CAD hx MI/ stent to RCA (2004) -Per Cardiology above  Antimicrobials: Anti-infectives (From admission, onward)   None       Subjective: Confused and agitated, wanting to climb out of bed  Objective: Vitals:   07/26/17 2100 07/27/17 0500 07/27/17 0834 07/27/17 1738  BP: (!) 104/55 104/73  124/71  Pulse: 71 92  (!) 122  Resp: 20 18    Temp: 98.3 F (36.8 C) 98.5 F (36.9 C)    TempSrc: Oral Oral    SpO2: 95%  95%   Weight:      Height:        Intake/Output Summary (Last 24 hours) at 07/27/2017 1741 Last data filed at 07/27/2017 1500 Gross per 24 hour  Intake 924 ml  Output 850 ml  Net 74 ml   Filed Weights   07/25/17 0925 07/26/17 0636  Weight: 66.7 kg (147 lb) 67.2 kg (148 lb 3.2 oz)    Examination:  General exam: Appears comfortable  Respiratory system: Clear to auscultation. Respiratory effort normal. Cardiovascular system: S1 & S2 heard, RRR Gastrointestinal system: Abdomen is nondistended, soft and nontender. No organomegaly or masses felt. Normal bowel sounds heard. Central nervous system: Alert and oriented. No focal neurological deficits. Extremities: Symmetric 5 x 5 power. Skin: No  rashes, lesions Psychiatry: Confused, appears agitated   Data Reviewed: I have personally reviewed following labs and imaging studies  CBC: Recent Labs  Lab 07/25/17 0930 07/26/17 0429 07/27/17 0433  WBC 12.6* 10.3 8.7  HGB 12.9* 12.4* 11.5*  HCT 40.6 38.5* 35.1*  MCV 97.4 96.0 95.1  PLT 154 126* 104*   Basic Metabolic Panel: Recent Labs  Lab 07/25/17 0930 07/26/17 0429  NA 137 140  K 3.9 3.8  CL 105 109  CO2 25 22  GLUCOSE 133* 123*  BUN 14 17  CREATININE 1.49* 1.33*  CALCIUM 9.1 8.8*   GFR: Estimated Creatinine Clearance: 40 mL/min (A) (by C-G formula based on SCr of 1.33 mg/dL (H)). Liver Function Tests: No results for input(s): AST, ALT,  ALKPHOS, BILITOT, PROT, ALBUMIN in the last 168 hours. No results for input(s): LIPASE, AMYLASE in the last 168 hours. No results for input(s): AMMONIA in the last 168 hours. Coagulation Profile: No results for input(s): INR, PROTIME in the last 168 hours. Cardiac Enzymes: Recent Labs  Lab 07/25/17 1616 07/25/17 2120 07/26/17 0429  TROPONINI 1.85* 7.36* 8.08*   BNP (last 3 results) No results for input(s): PROBNP in the last 8760 hours. HbA1C: Recent Labs    07/25/17 1616  HGBA1C 5.5   CBG: No results for input(s): GLUCAP in the last 168 hours. Lipid Profile: Recent Labs    07/26/17 0429  CHOL 126  HDL 49  LDLCALC 71  TRIG 30  CHOLHDL 2.6   Thyroid Function Tests: No results for input(s): TSH, T4TOTAL, FREET4, T3FREE, THYROIDAB in the last 72 hours. Anemia Panel: No results for input(s): VITAMINB12, FOLATE, FERRITIN, TIBC, IRON, RETICCTPCT in the last 72 hours. Sepsis Labs: No results for input(s): PROCALCITON, LATICACIDVEN in the last 168 hours.  No results found for this or any previous visit (from the past 240 hour(s)).   Radiology Studies: No results found.  Scheduled Meds: . arformoterol  15 mcg Nebulization BID   And  . umeclidinium bromide  1 puff Inhalation Daily  . aspirin EC  81 mg Oral Daily  . carvedilol  3.125 mg Oral BID WC  . donepezil  10 mg Oral QHS  . doxepin  10 mg Oral QHS  . guaiFENesin  600 mg Oral BID  . memantine  10 mg Oral BID   Continuous Infusions: . sodium chloride 10 mL/hr at 07/25/17 2201     LOS: 0 days   Rickey BarbaraStephen Lorely Bubb, MD [facility] Pager [phone]  If 7PM-7AM, please contact night-coverage [email or web address] Password [email or web address] 07/27/2017, 5:41 PM

## 2017-07-27 NOTE — Progress Notes (Signed)
Pt had multiple episode of SVT around 1740-1800. His scheduled Coreg was given at 1738.  HR one time went up to 150s and did not sustain. BP that time was 124/71.  No dyspnea noted. Placed him on O2 2l via [place]. Pt pulse Ox 100% on 2L.   EKG was done and showed NSR.  Currently in NRS HR in 100-120s.  Dr. Allena EaringBazemore made aware.  Hinton DyerYoko Zeya Balles, RN

## 2017-07-27 NOTE — Evaluation (Signed)
Occupational Therapy Evaluation Patient Details Name: [name] MRN: [id number] DOB: 04/14/1931 Today's Date: 07/27/2017    History of Present Illness 84 yo male with PMH of dementia, CAD s/p PCI of RCA ('04), HTN, HL, GERD who presented with chest pain.       Clinical Impression   Per chart review and pt reports, he was living at home with his wife and was performing BADLs. Pt currently requiring Min A for UB ADLs, Mod A for LB ADLs, and Min A for functional mobility with RW. Pt presenting with poor safety awareness and balance increasing his risk for falls. Pt would benefit from further acute OT to facilitate safe dc. Recommend dc to SNF for further OT to optimize safety, independence with ADLs, and return to PLOF.      Follow Up Recommendations  SNF;Supervision/Assistance - 24 hour    Equipment Recommendations  Other (comment)(Defer to next venue)    Recommendations for Other Services PT consult     Precautions / Restrictions Precautions Precautions: Fall Restrictions Weight Bearing Restrictions: No      Mobility Bed Mobility Overal bed mobility: Needs Assistance Bed Mobility: Supine to Sit;Sit to Supine     Supine to sit: Supervision Sit to supine: Supervision   General bed mobility comments: supervision for safety  Transfers Overall transfer level: Needs assistance Equipment used: Rolling walker (2 wheeled) Transfers: Sit to/from Stand Sit to Stand: Min assist         General transfer comment: Min A to steady in standing    Balance Overall balance assessment: Needs assistance Sitting-balance support: No upper extremity supported;Feet supported Sitting balance-Leahy Scale: Fair Sitting balance - Comments: able to maintain static sitting at EOB   Standing balance support: Bilateral upper extremity supported;During functional activity;No upper extremity supported Standing balance-Leahy Scale: Poor Standing balance comment: Reliant on physical A or UE  support                           ADL either performed or assessed with clinical judgement   ADL Overall ADL's : Needs assistance/impaired Eating/Feeding: Set up;Sitting   Grooming: Oral care;Minimal assistance;Standing Grooming Details (indicate cue type and reason): Min A for safety and standing balance Upper Body Bathing: Minimal assistance;Sitting   Lower Body Bathing: Moderate assistance;Sit to/from stand   Upper Body Dressing : Minimal assistance;Sitting   Lower Body Dressing: Moderate assistance;Sit to/from stand Lower Body Dressing Details (indicate cue type and reason): Pt able to don socks with Min A for sitting balance. Mod A for dynamic standing balance. Toilet Transfer: Minimal assistance;Ambulation;RW(simulated to recliner)           Functional mobility during ADLs: Minimal assistance General ADL Comments: Pt demonstrating decreased safety awareness, high fall risk, and poor balance.      Vision         Perception     Praxis      Pertinent Vitals/Pain Pain Assessment: No/denies pain     Hand Dominance Right   Extremity/Trunk Assessment Upper Extremity Assessment Upper Extremity Assessment: Overall WFL for tasks assessed   Lower Extremity Assessment Lower Extremity Assessment: Defer to PT evaluation   Cervical / Trunk Assessment Cervical / Trunk Assessment: Normal   Communication Communication Communication: No difficulties   Cognition Arousal/Alertness: Awake/alert Behavior During Therapy: WFL for tasks assessed/performed Overall Cognitive Status: Within Functional Limits for tasks assessed  General Comments  Sitter present during session. SpO2 97% on RA    Exercises     Shoulder Instructions      Home Living Family/patient expects to be discharged to:: Skilled nursing facility Living Arrangements: Spouse/significant other                                Additional Comments: Pt reporting he lives with his wife in a two story home with bedroom on first floor. Unsure of reliability with baseline dementia.       Prior Functioning/Environment Level of Independence: Independent        Comments: Pt reporting he performs ADLs without assistance, does not use DME, and wife performs all IADLs. Unsure of reliability with baseline dementia        OT Problem List: Decreased strength;Decreased range of motion;Decreased activity tolerance;Impaired balance (sitting and/or standing);Decreased cognition;Decreased safety awareness;Decreased knowledge of use of DME or AE;Decreased knowledge of precautions      OT Treatment/Interventions: Self-care/ADL training;Therapeutic exercise;Energy conservation;DME and/or AE instruction;Therapeutic activities;Patient/family education    OT Goals(Current goals can be found in the care plan section) Acute Rehab OT Goals Patient Stated Goal: Unstated OT Goal Formulation: Patient unable to participate in goal setting Time For Goal Achievement: 08/10/17 Potential to Achieve Goals: Good ADL Goals Pt Will Perform Grooming: with modified independence;standing Pt Will Perform Upper Body Dressing: with modified independence;sitting Pt Will Perform Lower Body Dressing: with modified independence;sit to/from stand Pt Will Transfer to Toilet: with modified independence;regular height toilet;ambulating Pt Will Perform Toileting - Clothing Manipulation and hygiene: with modified independence;sit to/from stand  OT Frequency: Min 2X/week   Barriers to D/C:            Co-evaluation              AM-PAC PT "6 Clicks" Daily Activity     Outcome Measure Help from another person eating meals?: None Help from another person taking care of personal grooming?: A Little Help from another person toileting, which includes using toliet, bedpan, or urinal?: A Little Help from another person bathing (including washing, rinsing,  drying)?: A Lot Help from another person to put on and taking off regular upper body clothing?: A Little Help from another person to put on and taking off regular lower body clothing?: A Lot 6 Click Score: 17   End of Session Equipment Utilized During Treatment: Gait belt;Rolling walker Nurse Communication: Mobility status  Activity Tolerance: Patient tolerated treatment well Patient left: in bed;with call bell/phone within reach;with bed alarm set;with nursing/sitter in room  OT Visit Diagnosis: Unsteadiness on feet (R26.81);Other abnormalities of gait and mobility (R26.89);Other symptoms and signs involving cognitive function                Time: 1030-1057 OT Time Calculation (min): 27 min Charges:  OT General Charges $OT Visit: 1 Visit OT Evaluation $OT Eval Moderate Complexity: 1 Mod G-Codes:     Mirca Yale MSOT, OTR/L Acute Rehab Pager: [phone] Office: [phone]  Theodoro GristCharis M Tamryn Popko 07/27/2017, 11:20 AM

## 2017-07-27 NOTE — Consult Note (Signed)
Consultation Note Date: 07/27/2017   Patient Name: [name]  DOB: March 26, 1931  MRN: [id number]  Age / Sex: 84 y.o., male  PCP: Merrilee Seashore, MD Referring Physician: Leonie Man, MD  Reason for Consultation: Establishing goals of care, Hospice Evaluation and Psychosocial/spiritual support  HPI/Patient Profile: 84 y.o. male  with past medical history of progressive dementia, non-STEMI, coronary artery disease (PCI 2004; cardiac cath 2007), hypertension hyperlipidemia, GERD, history of shoulder replacement 2016 admitted on 07/25/2017 with complaints of chest pain.  Patient has been living at home under the care of his wife.  His dementia has worsened now to the point where he is incontinent of bladder and bowel, wandering as well as being verbally threatening to spouse.  Spouse is verbalizing that she feels that she can no longer care for him in the home setting and he is being evaluated for skilled nursing facility placement  Consult ordered for goals of care as well as hospice discussion.   Clinical Assessment and Goals of Care: Patient seen, chart reviewed.  Met with patient's wife, [name].  Introduced Teacher, adult education of palliative medicine services as well as introduction to goals of care, advanced directives, including CODE STATUS discussion.  Mrs. Grotz is unaware of what advanced directives are , CODE STATUS, etc. and we began those discussions today.  She and her husband do not have a living will nor have they discussed advanced directives  Has 4 children from a previous relationship as well as 2 stepchildren.  Patient and his wife lived in [place] up until 2001.  They now reside in a basement apartment in their granddaughter's home.  Patient has seen Dr. Leonie Man, with neurology, for management of dementia since 2015.  He was started on Namenda and Aricept and initially showed some benefit with  that but has since stopped that medication.  Patient is unable to speak for himself at this point secondary to moderate dementia.  His healthcare proxy would be his wife, [name], [phone]    SUMMARY OF RECOMMENDATIONS   Introduced topic of CODE STATUS as well as defining terms of full code versus DNR Hard choices for Loving People as well as MOST form given to Mrs. Laubacher to review Patient remains a full code, full scope of treatment for now Palliative medicine plans to follow-up with Mrs. Whistler on 07/28/2017 Patient likely would qualify for his hospice Medicare benefit in the home or facility secondary to worsening dementia as well as underlying coronary artery disease, chronic kidney disease stage III.  I do not believe at this time he would qualify for residential hospice which  usually is a prognosis of approximately 2 weeks Patient's wife shares that he is now Medicaid eligible.  Per chart review, see that social work involved in terms of ascertaining skilled nursing placement.  This could be either for rehab with transition to hospice after skilled days used; or residential level of care with hospice support Code Status/Advance Care Planning:  Full code    Symptom Management:  Anxiety: Continue with Xanax 0.25 mg twice daily as needed as well as Haldol 1 mg every 8 hours as needed for more acute symptomology/agitation.  Patient was restarted on Namenda as well as Aricept.  Continue Sinequan 10 mg at bedtime  Palliative Prophylaxis:   Aspiration, Bowel Regimen, Delirium Protocol, Eye Care, Frequent Pain Assessment, Oral Care and Turn Reposition  Additional Recommendations (Limitations, Scope, Preferences):  Full Scope Treatment  Psycho-social/Spiritual:   Desire for further Chaplaincy support:no  Additional Recommendations: Referral to Community Resources   Prognosis:   Unable to determine  Discharge Planning: [place] for rehab with Palliative  care service follow-up      Primary Diagnoses: Present on Admission: . Chest pain   I have reviewed the medical record, interviewed the patient and family, and examined the patient. The following aspects are pertinent.  Past Medical History:  Diagnosis Date  . Allergic rhinitis   . Arthritis   . Constipation    takes Miralax daily as needed  . Coronary artery disease    s/p remote IWMI 2004 with PCI of the RCA with BMS and repeat cath 2007 due to recurrent CP and abnormal nuclear stress test and cath showed widely patent RCA stent with 30-50% distal stenosis before the takeoff of a PL and PDA and normal LVF.   [name] Depression with anxiety    takes Clonazepam nightly  . GERD (gastroesophageal reflux disease)    takes Omeprazole and Pepcid daily  . Heart attack ([place])   . History of bronchitis    pulmonologist is Dr.Wert.  . Hyperlipidemia    takes Atorvastatin daily  . Hypertension    takes Diovan daily  . Joint pain   . Memory loss   . RLS (restless legs syndrome)   . Unspecified vitamin D deficiency   . Vitamin B 12 deficiency    Social History   Socioeconomic History  . Marital status: Married    Spouse name: Not on file  . Number of children: 4  . Years of education: 12th  . Highest education level: Not on file  Occupational History  . Occupation: retired  Scientific laboratory technician  . Financial resource strain: Not on file  . Food insecurity:    Worry: Not on file    Inability: Not on file  . Transportation needs:    Medical: Not on file    Non-medical: Not on file  Tobacco Use  . Smoking status: Former Smoker    Packs/day: 3.00    Years: 15.00    Pack years: 45.00    Types: Cigarettes    Last attempt to quit: 02/06/1964    Years since quitting: 53.5  . Smokeless tobacco: Never Used  Substance and Sexual Activity  . Alcohol use: No    Alcohol/week: 0.0 oz  . Drug use: No  . Sexual activity: Not Currently  Lifestyle  . Physical activity:    Days per week: Not on  file    Minutes per session: Not on file  . Stress: Not on file  Relationships  . Social connections:    Talks on phone: Not on file    Gets together: Not on file    Attends religious service: Not on file    Active member of club or organization: Not on file    Attends meetings of clubs or organizations: Not on file    Relationship status: Not on file  Other Topics Concern  . Not on file  Social History Narrative  Patient lives at home with his wife.   Family History  Problem Relation Age of Onset  . Asthma Mother   . Cancer - Prostate Father    Scheduled Meds: . arformoterol  15 mcg Nebulization BID   And  . umeclidinium bromide  1 puff Inhalation Daily  . aspirin EC  81 mg Oral Daily  . carvedilol  3.125 mg Oral BID WC  . donepezil  10 mg Oral QHS  . doxepin  10 mg Oral QHS  . guaiFENesin  600 mg Oral BID  . memantine  10 mg Oral BID   Continuous Infusions: . sodium chloride 10 mL/hr at 07/25/17 2201   PRN Meds:.acetaminophen, ALPRAZolam, guaiFENesin-dextromethorphan, haloperidol, nitroGLYCERIN, ondansetron (ZOFRAN) IV Medications Prior to Admission:  Prior to Admission medications   Medication Sig Start Date End Date Taking? Authorizing Provider  ALPRAZolam (XANAX) 0.25 MG tablet Take 0.25 mg by mouth 2 (two) times daily. 07/03/17  Yes [provider]  chlorpheniramine (ALLERGY) 4 MG tablet Take 4 mg by mouth 2 (two) times daily as needed for allergies.   Yes [provider]  donepezil (ARICEPT) 10 MG tablet Take 1 tablet (10 mg total) by mouth at bedtime. Start 1/2 tablet daily x 4 weeks then 1 tablet daily 02/19/17  Yes Garvin Fila, MD  doxepin (SINEQUAN) 10 MG capsule Take 10 mg by mouth at bedtime.   Yes [provider]  fluticasone (FLONASE) 50 MCG/ACT nasal spray Place 1 spray into both nostrils 2 (two) times daily. Reported on 03/07/2015   Yes [provider]  guaiFENesin (MUCINEX) 600 MG 12 hr tablet Take 600 mg by mouth 2  (two) times daily.   Yes [provider]  memantine (NAMENDA TITRATION PAK) tablet pack 5 mg/day for =1 week; 5 mg twice daily for =1 week; 15 mg/day given in 5 mg and 10 mg separated doses for =1 week; then 10 mg twice daily 06/17/17  Yes Venancio Poisson, NP  Multiple Vitamin (MULTIVITAMIN WITH MINERALS) TABS tablet Take 1 tablet by mouth daily.   Yes [provider]  Tiotropium Bromide-Olodaterol (STIOLTO RESPIMAT) 2.5-2.5 MCG/ACT AERS Inhale 2 puffs into the lungs at bedtime.   Yes [provider]  tiotropium (SPIRIVA) 18 MCG inhalation capsule Place 1 capsule (18 mcg total) into inhaler and inhale daily. Patient not taking: Reported on 07/25/2017 03/31/16   Noe Gens, PA-C   No Known Allergies Review of Systems  Unable to perform ROS: Dementia    Physical Exam  Constitutional:  Frail elderly man, in no acute distress  HENT:  Head: Normocephalic and atraumatic.  Neck: Normal range of motion.  Cardiovascular: Normal rate.  Denies chest pain  Pulmonary/Chest: Effort normal.  Genitourinary:  Genitourinary Comments: Incontinent of bladder and bowel  Neurological:  Patient is somnolent but awakens easily to voice Believes  he is in [place]  Skin: Skin is warm and dry.  Psychiatric:  No overt agitation otherwise unable to test Is been impulsive, trying to climb out of bed requiring a safety sitter  Nursing note and vitals reviewed.   Vital Signs: BP 124/71   Pulse (!) 122   Temp 98.5 F (36.9 C) (Oral)   Resp 18   Ht _0  (1.778 m)   Wt 67.2 kg (148 lb 3.2 oz)   SpO2 95%   BMI 21.26 kg/m  Pain Scale: Faces   Pain Score: 0-No pain   SpO2: SpO2: 95 % O2 Device:SpO2: 95 % O2 Flow Rate: .  IO: Intake/output summary:   Intake/Output Summary (Last 24 hours) at 07/27/2017 1812 Last data filed at 07/27/2017 1500 Gross per 24 hour  Intake 924 ml  Output 850 ml  Net 74 ml    LBM:   Baseline Weight: Weight: 66.7 kg (147 lb) Most  recent weight: Weight: 67.2 kg (148 lb 3.2 oz)     Palliative Assessment/Data:   Flowsheet Rows     Most Recent Value  Intake Tab  Referral Department  Hospitalist  Unit at Time of Referral  Med/Surg Unit  Palliative Care Primary Diagnosis  Cardiac  Date Notified  07/25/17  Reason for referral  Clarify Goals of Care, Psychosocial or Spiritual support, Counsel Regarding Hospice  Date of Admission  07/25/17  Date first seen by Palliative Care  07/27/17  # of days Palliative referral response time  2 Day(s)  # of days IP prior to Palliative referral  0  Clinical Assessment  Palliative Performance Scale Score  40%  Pain Max last 24 hours  Not able to report  Pain Min Last 24 hours  Not able to report  Dyspnea Max Last 24 Hours  Not able to report  Dyspnea Min Last 24 hours  Not able to report  Nausea Max Last 24 Hours  Not able to report  Nausea Min Last 24 Hours  Not able to report  Anxiety Max Last 24 Hours  Not able to report  Anxiety Min Last 24 Hours  Not able to report  Other Max Last 24 Hours  Not able to report  Psychosocial & Spiritual Assessment  Palliative Care Outcomes  Patient/Family meeting held?  Yes  Who was at the meeting?  spouse      Time In: 1500 Time Out: 1600 Time Total: 60 min Greater than 50%  of this time was spent counseling and coordinating care related to the above assessment and plan.  Signed by: Dory Horn, NP   Please contact Palliative Medicine Team phone at [phone] for questions and concerns.  For individual provider: See Shea Evans

## 2017-07-28 LAB — CBC
HCT: 36.3 % — ABNORMAL LOW (ref 39.0–52.0)
Hemoglobin: 11.8 g/dL — ABNORMAL LOW (ref 13.0–17.0)
MCH: 31.2 pg (ref 26.0–34.0)
MCHC: 32.5 g/dL (ref 30.0–36.0)
MCV: 96 fL (ref 78.0–100.0)
PLATELETS: 112 10*3/uL — AB (ref 150–400)
RBC: 3.78 MIL/uL — AB (ref 4.22–5.81)
RDW: 14 % (ref 11.5–15.5)
WBC: 7.6 10*3/uL (ref 4.0–10.5)

## 2017-07-28 MED ORDER — DIGOXIN 0.25 MG/ML IJ SOLN
0.2500 mg | Freq: Four times a day (QID) | INTRAMUSCULAR | Status: AC
  Administered 2017-07-28 (×2): 0.25 mg via INTRAVENOUS
  Filled 2017-07-28 (×2): qty 2

## 2017-07-28 MED ORDER — ENOXAPARIN SODIUM 60 MG/0.6ML ~~LOC~~ SOLN
60.0000 mg | Freq: Two times a day (BID) | SUBCUTANEOUS | Status: DC
  Administered 2017-07-28: 60 mg via SUBCUTANEOUS
  Filled 2017-07-28: qty 0.6

## 2017-07-28 MED ORDER — DILTIAZEM HCL-DEXTROSE 100-5 MG/100ML-% IV SOLN (PREMIX)
5.0000 mg/h | INTRAVENOUS | Status: DC
  Administered 2017-07-28: 5 mg/h via INTRAVENOUS
  Filled 2017-07-28: qty 100

## 2017-07-28 NOTE — Progress Notes (Signed)
Subjective:  Remains confused.  No complaints of chest pain or shortness of breath.  He is at some intermittent paroxysmal atrial fibrillation and is currently in sustained atrial fibrillation this morning.  No current complaints of chest pain or shortness of breath.  Objective:  Vital Signs in the last 24 hours: BP 96/67   Pulse 85   Temp 98 F (36.7 C)   Resp 18   Ht 5\' 10"  (1.778 m)   Wt 67.2 kg (148 lb 3.2 oz)   SpO2 94%   BMI 21.26 kg/m   Physical Exam: Elderly male not currently agitated no complaints of chest pain lying quietly in bed Lungs:  Clear Cardiac:  Rapid irregular rhythm, normal S1 and S2, no S3 Extremities:  No edema present  Intake/Output from previous day: 06/22 0701 - 06/23 0700 In: 804 [P.O.:804] Out: 1000 [Urine:1000]  Weight Filed Weights   07/25/17 0925 07/26/17 0636  Weight: 66.7 kg (147 lb) 67.2 kg (148 lb 3.2 oz)    Lab Results: Basic Metabolic Panel: Recent Labs    07/26/17 0429  NA 140  K 3.8  CL 109  CO2 22  GLUCOSE 123*  BUN 17  CREATININE 1.33*   CBC: Recent Labs    07/27/17 0433 07/28/17 0731  WBC 8.7 7.6  HGB 11.5* 11.8*  HCT 35.1* 36.3*  MCV 95.1 96.0  PLT 104* 112*   Cardiac Panel (last 3 results) Recent Labs    07/25/17 1616 07/25/17 2120 07/26/17 0429  TROPONINI 1.85* 7.36* 8.08*    Telemetry: Sinus tachycardia   Assessment/Plan:  1. CAD with non-STEMI Medical therapy planned 2.  Dementia  Has been seen by Child psychotherapistsocial worker and plans are in place for SNF 3.  Hyperlipidemia 4.  New onset of atrial fibrillation with rapid response  Recommendations:  Currently in atrial fibrillation with rapid response.I will start him on intravenous diltiazem and given a single dose of dig to help with rate control.  We'll titrate beta blockers as pressure allows.  Cover with Lovenox now but suspect with dementia and agitation is not to be a good long-term anticoagulation candidate.  Darden PalmerW. Spencer Eoghan Belcher, Jr.  MD  [facility] InstituteFACC Cardiology  07/28/2017, 10:57 AM

## 2017-07-28 NOTE — Plan of Care (Signed)
  Problem: Activity: Goal: Risk for activity intolerance will decrease Outcome: Progressing Note:  Ambulated to bathroom with walker and staff assistance without difficulty.  Tolerated well.   Problem: Coping: Goal: Level of anxiety will decrease Outcome: Progressing Note:  PO meds effective.   Problem: Pain Managment: Goal: General experience of comfort will improve Outcome: Progressing Note:  Denies c/o pain or discomfort.

## 2017-07-28 NOTE — Progress Notes (Signed)
Patient's SBP was in 80s- low 90s. Manual was 88/56. Paged on call PA prior to starting cardizem gtt. Verbal  Orders given to start at lowest rate and no bolus. Will continue to monitor.

## 2017-07-28 NOTE — Progress Notes (Signed)
ANTICOAGULATION CONSULT NOTE Pharmacy Consult for Heparin to Lovenox Indication: chest pain/ACS  No Known Allergies  Patient Measurements: Height: 5\' 10"  (177.8 cm) Weight: 148 lb 3.2 oz (67.2 kg) IBW/kg (Calculated) : 73  Vital Signs: Temp: 98 F (36.7 C) (06/23 0621) BP: 80/54 (06/23 1128) Pulse Rate: 85 (06/23 0934)  Labs: Recent Labs    07/25/17 1616 07/25/17 2120  07/26/17 0429 07/27/17 0433 07/28/17 0731  HGB  --   --    < > 12.4* 11.5* 11.8*  HCT  --   --   --  38.5* 35.1* 36.3*  PLT  --   --   --  126* 104* 112*  HEPARINUNFRC  --   --   --  0.26*  --   --   CREATININE  --   --   --  1.33*  --   --   TROPONINI 1.85* 7.36*  --  8.08*  --   --    < > = values in this interval not displayed.    Estimated Creatinine Clearance: 40 mL/min (A) (by C-G formula based on SCr of 1.33 mg/dL (H)).  Assessment: 84 y.o. male with Afib to restart Lovenox for Afib Scr stable  Goal of Therapy:  Monitor platelets by anticoagulation protocol: Yes   Plan:  Lovenox 60 mg sq Q 12 hours Follow  CBC  Thank you Okey RegalLisa Almas Rake, PharmD [phone] 07/28/2017,11:32 AM

## 2017-07-28 NOTE — Progress Notes (Signed)
PROGRESS NOTE    [name]  AVW:[id number] DOB: 12/02/1931 DOA: 07/25/2017 PCP: Georgianne Fickamachandran, Ajith, MD    Brief Narrative:  84 y.o. year-old with hx of CAD/ MI w cor stent, depression /anxiety hyperlipidemia hypertension and memory loss/ progressive dementia admitted on 6/20 yesterday with chest pain.  Initial trop was negative but subsequent tests were + (0.901 > 1.85 > 7.3 > 8.08).  Decision made for medical Rx, getting IV heparin but has pulled out IV's twice now per family.  Today was confused, we are asked to see for AMS/ dementia.    Patient w/ long hx of cognitive loss progressing to dementia.  Has seen Dr Pearlean BrownieSethi since 2015 for this, was followed initially treated with Cerefolin and fish oil.  Memory worsened and he was started on Aricept , dementia improved for a bit then worsened again.  It has continued to worsen and recently in May 2019 was started on Namenda as a 2nd agent.  He recently became incontinent as well.  Family can't handle patient at home anymore, he takes his diapers off and soils the floors.  Wife has been caring for him but unable to at this point, they are requesting facility placement.  SW here is working on this now.  PT eval is pending.    Patient is oriented to "hospital". He denies any neck pain.  He recently had shingles of the R neck wrapping around the R upper chest per family  Assessment & Plan:   Active Problems:   Anxiety state   Chest pain   Goals of care, counseling/discussion   Palliative care by specialist  1  Dementia -Noted to have progressive decline since 2015 -Concerns for rapidly worsning dementia. Agree that placement is appropriate in this situation -Overall poor prognosis with Palliative Care consulted -Overnight received sedation overnight, remains calm and asleep this AM -EKG reviewed, QTc of 406 noted. Low dose PRN haldol ordered as needed   2  R chest/ neck shingles  - minimal remaining rash there on exam, no open  blisters -Patient seems comfortable this AM. Would not recommend any Rx for this. -Stable    3  Acute NSTEMI - continue medical Rx, continue as per cardiology  4  HTN -BP remains stable and controlled at this time  5  CAD hx MI/ stent to RCA (2004) -Per Cardiology above  6. Afib RVR -developed rapid afib -diltiazem with dig given as well as titration of beta blocker -Lovenox ordered, however doubt patient would be an ideal candidate for anticoagulation given advanced dementia, potential fall risk  Antimicrobials: Anti-infectives (From admission, onward)   None      Subjective: Asleep, noted to have been confused and agitated overnight  Objective: Vitals:   07/28/17 1128 07/28/17 1148 07/28/17 1150 07/28/17 1536  BP: (!) 80/54 (!) 88/56 (!) 83/61 96/62  Pulse:    75  Resp:    16  Temp:      TempSrc:      SpO2:    97%  Weight:      Height:        Intake/Output Summary (Last 24 hours) at 07/28/2017 1655 Last data filed at 07/28/2017 1435 Gross per 24 hour  Intake 752.28 ml  Output 400 ml  Net 352.28 ml   Filed Weights   07/25/17 0925 07/26/17 0636  Weight: 66.7 kg (147 lb) 67.2 kg (148 lb 3.2 oz)    Examination: General exam: asleep in no acute distress Respiratory system: normal chest  rise, clear, no audible wheezing Cardiovascular system: regular rhythm, s1-s2 Gastrointestinal system: Nondistended, nontender, pos BS Central nervous system: No seizures, no tremors Extremities: No cyanosis, no joint deformities Skin: No rashes, no pallor Psychiatry: unable to assess as pt remains mildly sedated since overnight  Data Reviewed: I have personally reviewed following labs and imaging studies  CBC: Recent Labs  Lab 07/25/17 0930 07/26/17 0429 07/27/17 0433 07/28/17 0731  WBC 12.6* 10.3 8.7 7.6  HGB 12.9* 12.4* 11.5* 11.8*  HCT 40.6 38.5* 35.1* 36.3*  MCV 97.4 96.0 95.1 96.0  PLT 154 126* 104* 112*   Basic Metabolic Panel: Recent Labs  Lab  07/25/17 0930 07/26/17 0429  NA 137 140  K 3.9 3.8  CL 105 109  CO2 25 22  GLUCOSE 133* 123*  BUN 14 17  CREATININE 1.49* 1.33*  CALCIUM 9.1 8.8*   GFR: Estimated Creatinine Clearance: 40 mL/min (A) (by C-G formula based on SCr of 1.33 mg/dL (H)). Liver Function Tests: No results for input(s): AST, ALT, ALKPHOS, BILITOT, PROT, ALBUMIN in the last 168 hours. No results for input(s): LIPASE, AMYLASE in the last 168 hours. No results for input(s): AMMONIA in the last 168 hours. Coagulation Profile: No results for input(s): INR, PROTIME in the last 168 hours. Cardiac Enzymes: Recent Labs  Lab 07/25/17 1616 07/25/17 2120 07/26/17 0429  TROPONINI 1.85* 7.36* 8.08*   BNP (last 3 results) No results for input(s): PROBNP in the last 8760 hours. HbA1C: No results for input(s): HGBA1C in the last 72 hours. CBG: No results for input(s): GLUCAP in the last 168 hours. Lipid Profile: Recent Labs    07/26/17 0429  CHOL 126  HDL 49  LDLCALC 71  TRIG 30  CHOLHDL 2.6   Thyroid Function Tests: No results for input(s): TSH, T4TOTAL, FREET4, T3FREE, THYROIDAB in the last 72 hours. Anemia Panel: No results for input(s): VITAMINB12, FOLATE, FERRITIN, TIBC, IRON, RETICCTPCT in the last 72 hours. Sepsis Labs: No results for input(s): PROCALCITON, LATICACIDVEN in the last 168 hours.  Recent Results (from the past 240 hour(s))  Culture, Urine     Status: None (Preliminary result)   Collection Time: 07/26/17 11:46 PM  Result Value Ref Range Status   Specimen Description URINE, CLEAN CATCH  Final   Special Requests NONE  Final   Culture   Final    CULTURE REINCUBATED FOR BETTER GROWTH Performed at [facility], [address] [place], [place] [address]    Report Status PENDING  Incomplete     Radiology Studies: No results found.  Scheduled Meds: . arformoterol  15 mcg Nebulization BID   And  . umeclidinium bromide  1 puff Inhalation Daily  . aspirin EC  81 mg Oral  Daily  . carvedilol  3.125 mg Oral BID WC  . digoxin  0.25 mg Intravenous Q6H  . donepezil  10 mg Oral QHS  . doxepin  10 mg Oral QHS  . enoxaparin (LOVENOX) injection  60 mg Subcutaneous Q12H  . guaiFENesin  600 mg Oral BID  . memantine  10 mg Oral BID   Continuous Infusions: . sodium chloride 10 mL/hr at 07/28/17 1435  . diltiazem (CARDIZEM) infusion Stopped (07/28/17 1434)     LOS: 1 day   Rickey Barbara, MD [facility] Pager [phone]  If 7PM-7AM, please contact night-coverage [email or web address] Password [facility] 07/28/2017, 4:55 PM

## 2017-07-29 LAB — CBC
HCT: 34.1 % — ABNORMAL LOW (ref 39.0–52.0)
HEMOGLOBIN: 11.1 g/dL — AB (ref 13.0–17.0)
MCH: 31.4 pg (ref 26.0–34.0)
MCHC: 32.6 g/dL (ref 30.0–36.0)
MCV: 96.6 fL (ref 78.0–100.0)
Platelets: 122 10*3/uL — ABNORMAL LOW (ref 150–400)
RBC: 3.53 MIL/uL — AB (ref 4.22–5.81)
RDW: 13.9 % (ref 11.5–15.5)
WBC: 6.8 10*3/uL (ref 4.0–10.5)

## 2017-07-29 LAB — URINE CULTURE

## 2017-07-29 MED ORDER — CLOPIDOGREL BISULFATE 75 MG PO TABS
75.0000 mg | ORAL_TABLET | Freq: Every day | ORAL | Status: DC
  Administered 2017-07-29 – 2017-08-01 (×4): 75 mg via ORAL
  Filled 2017-07-29 (×5): qty 1

## 2017-07-29 MED ORDER — ATORVASTATIN CALCIUM 20 MG PO TABS
20.0000 mg | ORAL_TABLET | Freq: Every day | ORAL | Status: DC
  Administered 2017-07-29 – 2017-07-31 (×3): 20 mg via ORAL
  Filled 2017-07-29 (×3): qty 1

## 2017-07-29 MED ORDER — HEPARIN SODIUM (PORCINE) 5000 UNIT/ML IJ SOLN
5000.0000 [IU] | Freq: Three times a day (TID) | INTRAMUSCULAR | Status: DC
  Administered 2017-07-29 – 2017-08-01 (×9): 5000 [IU] via SUBCUTANEOUS
  Filled 2017-07-29 (×9): qty 1

## 2017-07-29 NOTE — Progress Notes (Signed)
Occupational Therapy Treatment Patient Details Name: Douglas PentonRalph [name] MRN: [id number] DOB: 08/21/1931 Today's Date: 07/29/2017    History of present illness 84 yo male with PMH of dementia, CAD s/p PCI of RCA ('04), HTN, HL, GERD who presented with chest pain.    OT comments  Pt immediately willing to work on OOB activity and ADL with OT. Requiring min assist for bed mobility and ambulation with RW. Changed soiled gown with min assist and performed toileting with moderate assistance. Pt with minimal verbalization. Following commands with multimodal cues. VSS on RA.  Follow Up Recommendations  SNF;Supervision/Assistance - 24 hour    Equipment Recommendations       Recommendations for Other Services      Precautions / Restrictions Precautions Precautions: Fall       Mobility Bed Mobility Overal bed mobility: Needs Assistance Bed Mobility: Supine to Sit     Supine to sit: Min assist     General bed mobility comments: tactile cues to initiation, min assist to raise trunk and shift hips to EOB, pt with inefficient movement, increased time  Transfers Overall transfer level: Needs assistance Equipment used: Rolling walker (2 wheeled) Transfers: Sit to/from Stand Sit to Stand: Min assist         General transfer comment: assist to rise and steady from slightly elevated surface    Balance Overall balance assessment: Needs assistance   Sitting balance-Leahy Scale: Fair       Standing balance-Leahy Scale: Poor Standing balance comment: Reliant on physical A or UE support                           ADL either performed or assessed with clinical judgement   ADL Overall ADL's : Needs assistance/impaired                 Upper Body Dressing : Minimal assistance;Sitting Upper Body Dressing Details (indicate cue type and reason): changed soiled gown     Toilet Transfer: Minimal assistance;Ambulation;RW;BSC   Toileting- Clothing Manipulation and Hygiene:  Moderate assistance;Sit to/from stand       Functional mobility during ADLs: Minimal assistance;Rolling walker       Vision       Perception     Praxis      Cognition Arousal/Alertness: Awake/alert Behavior During Therapy: WFL for tasks assessed/performed Overall Cognitive Status: History of cognitive impairments - at baseline                                 General Comments: pt with h/o dementia, requires repetition of directions, yes/no is unreliable        Exercises     Shoulder Instructions       General Comments      Pertinent Vitals/ Pain       Pain Assessment: Faces Faces Pain Scale: No hurt  Home Living                                          Prior Functioning/Environment              Frequency  Min 2X/week        Progress Toward Goals  OT Goals(current goals can now be found in the care plan section)  Progress towards OT goals: Progressing toward goals  Acute Rehab OT Goals Patient Stated Goal: return home after rehab (wife) OT Goal Formulation: With family Time For Goal Achievement: 08/10/17 Potential to Achieve Goals: Good  Plan Discharge plan remains appropriate    Co-evaluation                 AM-PAC PT "6 Clicks" Daily Activity     Outcome Measure   Help from another person eating meals?: A Little Help from another person taking care of personal grooming?: A Little Help from another person toileting, which includes using toliet, bedpan, or urinal?: A Lot Help from another person bathing (including washing, rinsing, drying)?: A Lot Help from another person to put on and taking off regular upper body clothing?: A Little Help from another person to put on and taking off regular lower body clothing?: A Lot 6 Click Score: 15    End of Session Equipment Utilized During Treatment: Gait belt;Rolling walker  OT Visit Diagnosis: Unsteadiness on feet (R26.81);Other abnormalities of gait and  mobility (R26.89);Other symptoms and signs involving cognitive function   Activity Tolerance Patient tolerated treatment well   Patient Left in chair;with call bell/phone within reach;with chair alarm set;with family/visitor present   Nurse Communication          Time: 1334-1400 OT Time Calculation (min): 26 min  Charges: OT General Charges $OT Visit: 1 Visit OT Treatments $Self Care/Home Management : 23-37 mins  07/29/2017 Martie Round, OTR/L Pager: [phone]   Iran Planas, [place] Bailiff 07/29/2017, 2:27 PM

## 2017-07-29 NOTE — Progress Notes (Signed)
Progress Note  Patient Name: Douglas PentonRalph A Salguero Date of Encounter: 07/29/2017  Primary Cardiologist: Dr [name]  Subjective   Confused; denies CP or dyspnea  Inpatient Medications    Scheduled Meds: . arformoterol  15 mcg Nebulization BID   And  . umeclidinium bromide  1 puff Inhalation Daily  . aspirin EC  81 mg Oral Daily  . carvedilol  3.125 mg Oral BID WC  . donepezil  10 mg Oral QHS  . doxepin  10 mg Oral QHS  . enoxaparin (LOVENOX) injection  60 mg Subcutaneous Q12H  . guaiFENesin  600 mg Oral BID  . memantine  10 mg Oral BID   Continuous Infusions: . sodium chloride 10 mL/hr at 07/28/17 1855  . diltiazem (CARDIZEM) infusion Stopped (07/28/17 1434)   PRN Meds: acetaminophen, ALPRAZolam, guaiFENesin-dextromethorphan, haloperidol, nitroGLYCERIN, ondansetron (ZOFRAN) IV   Vital Signs    Vitals:   07/28/17 1731 07/28/17 1922 07/29/17 0500 07/29/17 0825  BP: 97/69 (!) 92/59 98/71 104/75  Pulse: 80 91 74 70  Resp:  (!) 25 (!) 22   Temp:  98.8 F (37.1 C) 98.4 F (36.9 C)   TempSrc:  Oral Axillary   SpO2:  96% 100%   Weight:   159 lb 8 oz (72.3 kg)   Height:        Intake/Output Summary (Last 24 hours) at 07/29/2017 0933 Last data filed at 07/28/2017 2300 Gross per 24 hour  Intake 710.38 ml  Output -  Net 710.38 ml   Filed Weights   07/25/17 0925 07/26/17 0636 07/29/17 0500  Weight: 147 lb (66.7 kg) 148 lb 3.2 oz (67.2 kg) 159 lb 8 oz (72.3 kg)    Telemetry    Sinus- Personally Reviewed   Physical Exam   GEN: No acute distress.  Chronically ill appearing Neck: No JVD Cardiac: RRR, no murmurs, rubs, or gallops.  Respiratory: Clear to auscultation bilaterally. GI: Soft, nontender, non-distended  MS: No edema Neuro:  Nonfocal; confused   Labs    Chemistry Recent Labs  Lab 07/25/17 0930 07/26/17 0429  NA 137 140  K 3.9 3.8  CL 105 109  CO2 25 22  GLUCOSE 133* 123*  BUN 14 17  CREATININE 1.49* 1.33*  CALCIUM 9.1 8.8*  GFRNONAA 42* 48*    GFRAA 48* 55*  ANIONGAP 7 9     Hematology Recent Labs  Lab 07/27/17 0433 07/28/17 0731 07/29/17 0704  WBC 8.7 7.6 6.8  RBC 3.69* 3.78* 3.53*  HGB 11.5* 11.8* 11.1*  HCT 35.1* 36.3* 34.1*  MCV 95.1 96.0 96.6  MCH 31.2 31.2 31.4  MCHC 32.8 32.5 32.6  RDW 14.2 14.0 13.9  PLT 104* 112* 122*    Cardiac Enzymes Recent Labs  Lab 07/25/17 1616 07/25/17 2120 07/26/17 0429  TROPONINI 1.85* 7.36* 8.08*    Recent Labs  Lab 07/25/17 0950  TROPIPOC 0.01     Patient Profile     84 y.o. male with past medical history of coronary artery disease admitted with non-ST elevation myocardial infarction.  Also with significant dementia and medical therapy being pursued.  Echocardiogram June 2019 showed normal LV function with akinesis of the basal/mid inferior wall.  Grade 1 diastolic dysfunction, mild aortic insufficiency and mildly dilated ascending aorta.  Assessment & Plan    1 non-ST elevation myocardial infarction-patient is felt not to be a candidate for cardiac catheterization.  Plan medical therapy.  LV function is preserved.  Continue aspirin and carvedilol.  Add Plavix 75 mg daily.  Add Lipitor 20 mg daily.  2 significant dementia-plan is for placement.  3 paroxysmal atrial fibrillation-Patient is in sinus this morning.  Continue carvedilol at present dose.  If he has more frequent episodes we will add amiodarone.  He is not a candidate for anticoagulation given significant dementia.  4 hypertension-blood pressure is controlled.  Continue present medications.  For questions or updates, please contact [facility] Please consult [email or web address] for contact info under Cardiology/STEMI.      Signed, Olga Millers, MD  07/29/2017, 9:33 AM

## 2017-07-29 NOTE — Consult Note (Signed)
[facility] CM Primary Care Navigator  07/29/2017  BLAS RICHES 1931-08-26 [id number]   Met with patient and wife ([name])at the bedside to identify possible discharge needs.Patient has been dozing most of the visit. Wife reports that patient had "chest pain"that had led to this admission.  Wife endorses Dr. Merrilee Seashore with [facility] as the primary care provider.   Patient's wife states using [facility] in [place] and [facility] in [place] to obtain medications with no current difficulty.  Patient's wife has been managing his medications at home straight out of the containers.  Wife has been driving and providing transportation to his to doctors'appointments.  Patient lives with wife who serves as his primary caregiver at home.   Discharge planisSNF (skilled nursing facility- in process) for rehabilitation per therapy recommendation. Patient will admit to SNF (skilled nursing facility) under rehab benefit until able to transition to long term care, per Inpatient social worker note.  Wife expressed understanding of needto seekreferral to [facility] care managementfrom a primary care providerif deemed necessary andappropriate foranyservices in the near future (if patient will return back home).  [facility] care management information provided for future needs thatpatient may have.    For additional questions please contact:  Edwena Felty A. Trinty Marken, BSN, RN-BC [facility] PRIMARY CARE Navigator Cell: [phone]

## 2017-07-29 NOTE — [place] FL2 (Signed)
[place] MEDICAID FL2 LEVEL OF CARE SCREENING TOOL     IDENTIFICATION  Patient Name: [name] Birthdate: 11/01/1931 Sex: male Admission Date (Current Location): 07/25/2017  [facility] [place]   Facility and Address:  [facility], [address], [place], [place] [address]      Provider Number: [id number]  Attending Physician Name and Address:  Marykay LexHarding, David W, MD  Relative Name and Phone Number:  Donnetta HailJoy Tuner, spouse, [phone]    Current Level of Care: Hospital Recommended Level of Care: Skilled Nursing Facility Prior Approval Number:    Date Approved/Denied:   PASRR Number: [id number]  Discharge Plan: SNF    Current Diagnoses: Patient Active Problem List   Diagnosis Date Noted  . Goals of care, counseling/discussion   . Palliative care by specialist   . Chest pain 07/25/2017  . Dementia 03/07/2015  . Fall (on) (from) other stairs and steps, sequela 03/07/2015  . S/P shoulder replacement 04/29/2014  . Essential hypertension 03/11/2014  . Mild cognitive impairment 03/12/2013  . Anxiety state 03/12/2013  . CELLULITIS AND ABSCESS OF UPPER ARM AND FOREARM 07/05/2009  . Dyslipidemia 03/21/2007  . MI, old 03/21/2007  . ALLERGIC RHINITIS 03/21/2007  . COUGH 01/14/2007  . CAD (coronary artery disease), native coronary artery 01/13/2007  . CHRONIC RHINITIS 01/13/2007  . COPD pfts pending  01/13/2007  . Esophageal reflux 01/13/2007  . DYSPNEA 01/13/2007    Orientation RESPIRATION BLADDER Height & Weight     Self, Place  O2(nasal cannula 2L) Incontinent Weight: 159 lb 8 oz (72.3 kg) Height:  5\' 10"  (177.8 cm)  BEHAVIORAL SYMPTOMS/MOOD NEUROLOGICAL BOWEL NUTRITION STATUS      Continent Diet(please see DC summary)  AMBULATORY STATUS COMMUNICATION OF NEEDS Skin   Limited Assist Verbally Normal                       Personal Care Assistance Level of Assistance  Bathing, Feeding, Dressing Bathing Assistance:  Limited assistance Feeding assistance: Independent Dressing Assistance: Limited assistance     Functional Limitations Info  Sight, Hearing, Speech Sight Info: Adequate Hearing Info: Impaired Speech Info: Adequate    SPECIAL CARE FACTORS FREQUENCY  PT (By licensed PT), OT (By licensed OT)     PT Frequency: 5x/week OT Frequency: 5x/week            Contractures Contractures Info: Not present    Additional Factors Info  Code Status, Allergies Code Status Info: Full Allergies Info: No Known Allergies           Current Medications (07/29/2017):  This is the current hospital active medication list Current Facility-Administered Medications  Medication Dose Route Frequency Provider Last Rate Last Dose  . 0.9 %  sodium chloride infusion   Intravenous Continuous Marykay LexHarding, David W, MD 10 mL/hr at 07/28/17 1855    . acetaminophen (TYLENOL) tablet 650 mg  650 mg Oral Q4H PRN Arty Baumgartneroberts, Lindsay B, NP      . ALPRAZolam Prudy Feeler(XANAX) tablet 0.25 mg  0.25 mg Oral BID PRN Ronie Spiesunn, Dayna N, PA-C   0.25 mg at 07/29/17 0321  . arformoterol (BROVANA) nebulizer solution 15 mcg  15 mcg Nebulization BID Marykay LexHarding, David W, MD   15 mcg at 07/28/17 2012   And  . umeclidinium bromide (INCRUSE ELLIPTA) 62.5 MCG/INH 1 puff  1 puff Inhalation Daily Marykay LexHarding, David W, MD   1 puff at 07/28/17 [phone]  . aspirin EC tablet 81 mg  81  mg Oral Daily Laverda Page B, NP   81 mg at 07/29/17 1610  . carvedilol (COREG) tablet 3.125 mg  3.125 mg Oral BID WC Othella Boyer, MD   3.125 mg at 07/29/17 0825  . diltiazem (CARDIZEM) 100 mg in dextrose 5% (1 mg/mL) infusion  5-15 mg/hr Intravenous Titrated Othella Boyer, MD   Stopped at 07/28/17 1434  . donepezil (ARICEPT) tablet 10 mg  10 mg Oral QHS Laverda Page B, NP   10 mg at 07/28/17 2049  . doxepin (SINEQUAN) capsule 10 mg  10 mg Oral QHS Dunn, Dayna N, PA-C   10 mg at 07/28/17 2049  . enoxaparin (LOVENOX) injection 60 mg  60 mg Subcutaneous Q12H Marykay Lex, MD   60 mg at 07/28/17 1217  . guaiFENesin (MUCINEX) 12 hr tablet 600 mg  600 mg Oral BID Dunn, Dayna N, PA-C   600 mg at 07/29/17 0827  . guaiFENesin-dextromethorphan (ROBITUSSIN DM) 100-10 MG/5ML syrup 15 mL  15 mL Oral Q4H PRN Marykay Lex, MD   15 mL at 07/26/17 0333  . haloperidol (HALDOL) tablet 1 mg  1 mg Oral Q8H PRN Jerald Kief, MD      . memantine [facility]) tablet 10 mg  10 mg Oral BID Marykay Lex, MD   10 mg at 07/29/17 9604  . nitroGLYCERIN (NITROSTAT) SL tablet 0.4 mg  0.4 mg Sublingual Q5 Min x 3 PRN Laverda Page B, NP      . ondansetron [facility]) injection 4 mg  4 mg Intravenous Q6H PRN Arty Baumgartner, NP         Discharge Medications: Please see discharge summary for a list of discharge medications.  Relevant Imaging Results:  Relevant Lab Results:   Additional Information SSN: [id number]  Abigail Butts, LCSW

## 2017-07-29 NOTE — Progress Notes (Signed)
PROGRESS NOTE    [name]  WUJ:[id number] DOB: [birth year] DOA: 07/25/2017 PCP: Georgianne Fick, MD    Brief Narrative:  84 y.o. year-old with hx of CAD/ MI w cor stent, depression /anxiety hyperlipidemia hypertension and memory loss/ progressive dementia admitted on 6/20 yesterday with chest pain.  Initial trop was negative but subsequent tests were + (0.901 > 1.85 > 7.3 > 8.08).  Decision made for medical Rx, getting IV heparin but has pulled out IV's twice now per family.  Today was confused, we are asked to see for AMS/ dementia.    Patient w/ long hx of cognitive loss progressing to dementia.  Has seen [name] since 2015 for this, was followed initially treated with Cerefolin and fish oil.  Memory worsened and he was started on Aricept , dementia improved for a bit then worsened again.  It has continued to worsen and recently in May 2019 was started on Namenda as a 2nd agent.  He recently became incontinent as well.  Family can't handle patient at home anymore, he takes his diapers off and soils the floors.  Wife has been caring for him but unable to at this point, they are requesting facility placement.  SW here is working on this now.  PT eval is pending.    Patient is oriented to "hospital". He denies any neck pain.  He recently had shingles of the R neck wrapping around the R upper chest per family  Assessment & Plan:   Active Problems:   Anxiety state   Chest pain   Goals of care, counseling/discussion   Palliative care by specialist  1  Dementia -Noted to have progressive decline since 2015 -Concerns for rapidly worsning dementia. Agree that placement is appropriate in this situation -Overall poor long term prognosis with Palliative Care consulted -Overnight received sedation overnight, remains calm and asleep this AM -EKG reviewed, QTc of 406 noted. Have continued patient on dose PRN haldol as needed -SNF placement in progress   2  R chest/ neck shingles  -  minimal remaining rash there on exam, no open blisters -Patient seems comfortable this AM. Would not recommend any Rx for this. -Stable    3  Acute NSTEMI - continue medical Rx, continue as per cardiology  4  HTN -BP remains stable and controlled at this time  5  CAD hx MI/ stent to RCA (2004) -Per Cardiology above  6. Afib RVR -recently developed rapid afib. Stable on monitor this  AM -diltiazem with dig given as well as titration of beta blocker -Lovenox ordered, however doubt patient would be an ideal candidate for anticoagulation given advanced dementia, potential fall risk  Antimicrobials: Anti-infectives (From admission, onward)   None      Subjective: Somewhat confused, in nad. Without complaints  Objective: Vitals:   07/28/17 1731 07/28/17 1922 07/29/17 0500 07/29/17 0825  BP: 97/69 (!) 92/59 98/71 104/75  Pulse: 80 91 74 70  Resp:  (!) 25 (!) 22   Temp:  98.8 F (37.1 C) 98.4 F (36.9 C)   TempSrc:  Oral Axillary   SpO2:  96% 100%   Weight:   72.3 kg (159 lb 8 oz)   Height:        Intake/Output Summary (Last 24 hours) at 07/29/2017 1552 Last data filed at 07/29/2017 1000 Gross per 24 hour  Intake 421.03 ml  Output 600 ml  Net -178.97 ml   Filed Weights   07/25/17 0925 07/26/17 0636 07/29/17 0500  Weight:  66.7 kg (147 lb) 67.2 kg (148 lb 3.2 oz) 72.3 kg (159 lb 8 oz)    Examination: General exam: Conversant, in no acute distress Respiratory system: normal chest rise, clear, no audible wheezing Cardiovascular system: regular rhythm, s1-s2  Data Reviewed: I have personally reviewed following labs and imaging studies  CBC: Recent Labs  Lab 07/25/17 0930 07/26/17 0429 07/27/17 0433 07/28/17 0731 07/29/17 0704  WBC 12.6* 10.3 8.7 7.6 6.8  HGB 12.9* 12.4* 11.5* 11.8* 11.1*  HCT 40.6 38.5* 35.1* 36.3* 34.1*  MCV 97.4 96.0 95.1 96.0 96.6  PLT 154 126* 104* 112* 122*   Basic Metabolic Panel: Recent Labs  Lab 07/25/17 0930 07/26/17 0429  NA  137 140  K 3.9 3.8  CL 105 109  CO2 25 22  GLUCOSE 133* 123*  BUN 14 17  CREATININE 1.49* 1.33*  CALCIUM 9.1 8.8*   GFR: Estimated Creatinine Clearance: 43 mL/min (A) (by C-G formula based on SCr of 1.33 mg/dL (H)). Liver Function Tests: No results for input(s): AST, ALT, ALKPHOS, BILITOT, PROT, ALBUMIN in the last 168 hours. No results for input(s): LIPASE, AMYLASE in the last 168 hours. No results for input(s): AMMONIA in the last 168 hours. Coagulation Profile: No results for input(s): INR, PROTIME in the last 168 hours. Cardiac Enzymes: Recent Labs  Lab 07/25/17 1616 07/25/17 2120 07/26/17 0429  TROPONINI 1.85* 7.36* 8.08*   BNP (last 3 results) No results for input(s): PROBNP in the last 8760 hours. HbA1C: No results for input(s): HGBA1C in the last 72 hours. CBG: No results for input(s): GLUCAP in the last 168 hours. Lipid Profile: No results for input(s): CHOL, HDL, LDLCALC, TRIG, CHOLHDL, LDLDIRECT in the last 72 hours. Thyroid Function Tests: No results for input(s): TSH, T4TOTAL, FREET4, T3FREE, THYROIDAB in the last 72 hours. Anemia Panel: No results for input(s): VITAMINB12, FOLATE, FERRITIN, TIBC, IRON, RETICCTPCT in the last 72 hours. Sepsis Labs: No results for input(s): PROCALCITON, LATICACIDVEN in the last 168 hours.  Recent Results (from the past 240 hour(s))  Culture, Urine     Status: Abnormal   Collection Time: 07/26/17 11:46 PM  Result Value Ref Range Status   Specimen Description URINE, CLEAN CATCH  Final   Special Requests   Final    NONE Performed at [facility], [address] [place], [place] [address]    Culture MULTIPLE SPECIES PRESENT, SUGGEST RECOLLECTION (A)  Final   Report Status 07/29/2017 FINAL  Final     Radiology Studies: No results found.  Scheduled Meds: . arformoterol  15 mcg Nebulization BID   And  . umeclidinium bromide  1 puff Inhalation Daily  . aspirin EC  81 mg Oral Daily  . atorvastatin  20 mg Oral  q1800  . carvedilol  3.125 mg Oral BID WC  . clopidogrel  75 mg Oral Daily  . donepezil  10 mg Oral QHS  . doxepin  10 mg Oral QHS  . guaiFENesin  600 mg Oral BID  . heparin injection (subcutaneous)  5,000 Units Subcutaneous Q8H  . memantine  10 mg Oral BID   Continuous Infusions: . sodium chloride 10 mL/hr at 07/29/17 1000     LOS: 2 days   Rickey BarbaraStephen Koleen Celia, MD [facility] Pager [phone]  If 7PM-7AM, please contact night-coverage [email or web address] Password [email or web address] 07/29/2017, 3:52 PM

## 2017-07-29 NOTE — Progress Notes (Signed)
CSW met with patient's spouse, Caryl Asp, and discussed SNF bed offers. Spouse chose [facility]. SNF to start [facility] authorization today - awaiting determination. Patient requires authorization before admitting to facility. CSW to follow and support.   Estanislado Emms, [place]

## 2017-07-30 LAB — CBC
HEMATOCRIT: 34.2 % — AB (ref 39.0–52.0)
Hemoglobin: 11 g/dL — ABNORMAL LOW (ref 13.0–17.0)
MCH: 31.1 pg (ref 26.0–34.0)
MCHC: 32.2 g/dL (ref 30.0–36.0)
MCV: 96.6 fL (ref 78.0–100.0)
PLATELETS: 122 10*3/uL — AB (ref 150–400)
RBC: 3.54 MIL/uL — ABNORMAL LOW (ref 4.22–5.81)
RDW: 13.6 % (ref 11.5–15.5)
WBC: 5.5 10*3/uL (ref 4.0–10.5)

## 2017-07-30 MED ORDER — ALBUTEROL SULFATE (2.5 MG/3ML) 0.083% IN NEBU: 2.5000 mg | INHALATION_SOLUTION | Freq: Four times a day (QID) | RESPIRATORY_TRACT | Status: DC | PRN

## 2017-07-30 NOTE — Progress Notes (Signed)
Patient's wife completed Medicaid application for patient and requested assistance in sending application to [facility]. CSW faxed application and received confirmation that it was sent to Mail In Team at fax # [phone].  CSW received call from [facility] additional clinicals on patient before making determination on approval for SNF. CSW called [employer] and confirmed what information they require. CSW sent additional clinicals to [facility] at fax # [phone].   Peer to peer may be required if additional clinicals not accepted. Discussed with MD, Dr. Jens Somrenshaw.  CSW awaiting [employer] determination. Will support with discharge to SNF.  Abigail ButtsSusan Modest Draeger, LCSWA [phone]

## 2017-07-30 NOTE — Progress Notes (Signed)
PROGRESS NOTE    [name]  AVW:[id number] DOB: 11/20/1931 DOA: 07/25/2017 PCP: Georgianne Fickamachandran, Ajith, MD    Brief Narrative:  84 y.o. year-old with hx of CAD/ MI w cor stent, depression /anxiety hyperlipidemia hypertension and memory loss/ progressive dementia admitted on 6/20 yesterday with chest pain.  Initial trop was negative but subsequent tests were + (0.901 > 1.85 > 7.3 > 8.08).  Decision made for medical Rx, getting IV heparin but has pulled out IV's twice now per family.  Today was confused, we are asked to see for AMS/ dementia.    Patient w/ long hx of cognitive loss progressing to dementia.  Has seen Dr Pearlean BrownieSethi since 2015 for this, was followed initially treated with Cerefolin and fish oil.  Memory worsened and he was started on Aricept , dementia improved for a bit then worsened again.  It has continued to worsen and recently in May 2019 was started on Namenda as a 2nd agent.  He recently became incontinent as well.  Family can't handle patient at home anymore, he takes his diapers off and soils the floors.  Wife has been caring for him but unable to at this point, they are requesting facility placement.  SW here is working on this now.  PT eval is pending.    Patient is oriented to "hospital". He denies any neck pain.  He recently had shingles of the R neck wrapping around the R upper chest per family  Assessment & Plan:   Active Problems:   Anxiety state   Chest pain   Goals of care, counseling/discussion   Palliative care by specialist  1  Dementia -Noted to have progressive decline since 2015 -Concerns for rapidly worsning dementia. Agree that placement is appropriate in this situation -Overall poor long term prognosis with Palliative Care consulted -Patient noted to have periods of agitation at night -EKG reviewed, QTc of 406, unremarkable  -SNF placement in progress -Will order low dose Seroquel QHS   2  R chest/ neck shingles  - minimal remaining rash there on  exam, no open blisters -Patient seems comfortable this AM. Would not recommend any Rx for this. -Remains stable at this time    3  Acute NSTEMI  - continue medical Rx, continue as per cardiology  4  HTN -BP remains stable and controlled at this time  5  CAD hx MI/ stent to RCA (2004) -Per Cardiology above  6. Afib RVR -recently developed rapid afib. Stable on monitor this  AM -diltiazem with dig given as well as titration of beta blocker -Lovenox ordered, however doubt patient would be an ideal candidate for anticoagulation given advanced dementia, potential fall risk  Patient stable at present. Will sign off at this time. Please do not hesitate to call if there are any further questions.  Antimicrobials: Anti-infectives (From admission, onward)   None      Subjective: No complaints  Objective: Vitals:   07/30/17 0000 07/30/17 0400 07/30/17 0817 07/30/17 1433  BP:  116/80  109/71  Pulse: 72 66  73  Resp:  18    Temp:  98.1 F (36.7 C)  98 F (36.7 C)  TempSrc:  Oral  Oral  SpO2: 97% 98% 98% 99%  Weight:  70.1 kg (154 lb 9.6 oz)    Height:        Intake/Output Summary (Last 24 hours) at 07/30/2017 1501 Last data filed at 07/30/2017 0800 Gross per 24 hour  Intake 643.65 ml  Output 750 ml  Net -106.35 ml   Filed Weights   07/26/17 0636 07/29/17 0500 07/30/17 0400  Weight: 67.2 kg (148 lb 3.2 oz) 72.3 kg (159 lb 8 oz) 70.1 kg (154 lb 9.6 oz)    Examination: General exam: Awake, laying in bed, in nad Respiratory system: Normal respiratory effort, no wheezing Cardiovascular system: regular rate, s1, s2  Data Reviewed: I have personally reviewed following labs and imaging studies  CBC: Recent Labs  Lab 07/26/17 0429 07/27/17 0433 07/28/17 0731 07/29/17 0704 07/30/17 0550  WBC 10.3 8.7 7.6 6.8 5.5  HGB 12.4* 11.5* 11.8* 11.1* 11.0*  HCT 38.5* 35.1* 36.3* 34.1* 34.2*  MCV 96.0 95.1 96.0 96.6 96.6  PLT 126* 104* 112* 122* 122*   Basic Metabolic  Panel: Recent Labs  Lab 07/25/17 0930 07/26/17 0429  NA 137 140  K 3.9 3.8  CL 105 109  CO2 25 22  GLUCOSE 133* 123*  BUN 14 17  CREATININE 1.49* 1.33*  CALCIUM 9.1 8.8*   GFR: Estimated Creatinine Clearance: 41.7 mL/min (A) (by C-G formula based on SCr of 1.33 mg/dL (H)). Liver Function Tests: No results for input(s): AST, ALT, ALKPHOS, BILITOT, PROT, ALBUMIN in the last 168 hours. No results for input(s): LIPASE, AMYLASE in the last 168 hours. No results for input(s): AMMONIA in the last 168 hours. Coagulation Profile: No results for input(s): INR, PROTIME in the last 168 hours. Cardiac Enzymes: Recent Labs  Lab 07/25/17 1616 07/25/17 2120 07/26/17 0429  TROPONINI 1.85* 7.36* 8.08*   BNP (last 3 results) No results for input(s): PROBNP in the last 8760 hours. HbA1C: No results for input(s): HGBA1C in the last 72 hours. CBG: No results for input(s): GLUCAP in the last 168 hours. Lipid Profile: No results for input(s): CHOL, HDL, LDLCALC, TRIG, CHOLHDL, LDLDIRECT in the last 72 hours. Thyroid Function Tests: No results for input(s): TSH, T4TOTAL, FREET4, T3FREE, THYROIDAB in the last 72 hours. Anemia Panel: No results for input(s): VITAMINB12, FOLATE, FERRITIN, TIBC, IRON, RETICCTPCT in the last 72 hours. Sepsis Labs: No results for input(s): PROCALCITON, LATICACIDVEN in the last 168 hours.  Recent Results (from the past 240 hour(s))  Culture, Urine     Status: Abnormal   Collection Time: 07/26/17 11:46 PM  Result Value Ref Range Status   Specimen Description URINE, CLEAN CATCH  Final   Special Requests   Final    NONE Performed at [facility], [address] [place], [place] [address]    Culture MULTIPLE SPECIES PRESENT, SUGGEST RECOLLECTION (A)  Final   Report Status 07/29/2017 FINAL  Final     Radiology Studies: No results found.  Scheduled Meds: . arformoterol  15 mcg Nebulization BID   And  . umeclidinium bromide  1 puff Inhalation Daily   . aspirin EC  81 mg Oral Daily  . atorvastatin  20 mg Oral q1800  . carvedilol  3.125 mg Oral BID WC  . clopidogrel  75 mg Oral Daily  . donepezil  10 mg Oral QHS  . doxepin  10 mg Oral QHS  . guaiFENesin  600 mg Oral BID  . heparin injection (subcutaneous)  5,000 Units Subcutaneous Q8H  . memantine  10 mg Oral BID   Continuous Infusions: . sodium chloride Stopped (07/29/17 1428)     LOS: 3 days   Rickey Barbara, MD [facility] Pager [phone]  If 7PM-7AM, please contact night-coverage [email or web address] Password [facility] 07/30/2017, 3:01 PM

## 2017-07-30 NOTE — Progress Notes (Signed)
Physical Therapy Treatment Patient Details Name: [name] MRN: [id number] DOB: 1931/11/13 Today's Date: 07/30/2017    History of Present Illness 84 yo male with PMH of dementia, CAD s/p PCI of RCA ('04), HTN, HL, GERD who presented with chest pain.     PT Comments    Pt continues to require assist for all mobility due to weakness and decreased balance. Spoke with pt's wife and prior to admission pt was ambulating independently without assistive device. Continue to recommend SNF for further rehab to work toward return toward more independence and to decrease burden of care of pt.   Follow Up Recommendations  SNF;Supervision/Assistance - 24 hour     Equipment Recommendations  (TBD next venue)    Recommendations for Other Services       Precautions / Restrictions Precautions Precautions: Fall Restrictions Weight Bearing Restrictions: No    Mobility  Bed Mobility Overal bed mobility: Needs Assistance Bed Mobility: Supine to Sit;Sit to Supine     Supine to sit: Min assist Sit to supine: Supervision   General bed mobility comments: Assist to elevate trunk into sitting.  Transfers Overall transfer level: Needs assistance Equipment used: Rolling walker (2 wheeled) Transfers: Sit to/from Stand Sit to Stand: Min assist         General transfer comment: Assist to bring hips up and for balance. Pt with posterior lean.  Ambulation/Gait Ambulation/Gait assistance: Min assist;Mod assist Gait Distance (Feet): 90 Feet Assistive device: Rolling walker (2 wheeled);1 person hand held assist Gait Pattern/deviations: Step-through pattern;Decreased step length - right;Decreased step length - left;Trunk flexed;Staggering left;Staggering right Gait velocity: decreased Gait velocity interpretation: <1.8 ft/sec, indicate of risk for recurrent falls General Gait Details: Assist for balance and support. When using rolling walker pt needed min assist as well as assist to guide  walker. Verbal/tactile cues to stay closer to walker. With hand held assist pt required mod assist for balance with staggering rt and lt.    Stairs             Wheelchair Mobility    Modified Rankin (Stroke Patients Only)       Balance Overall balance assessment: Needs assistance Sitting-balance support: No upper extremity supported;Feet supported Sitting balance-Leahy Scale: Fair     Standing balance support: Bilateral upper extremity supported;Single extremity supported Standing balance-Leahy Scale: Poor Standing balance comment: UE support and min assist for static standing                            Cognition Arousal/Alertness: Awake/alert Behavior During Therapy: WFL for tasks assessed/performed Overall Cognitive Status: History of cognitive impairments - at baseline                                        Exercises      General Comments        Pertinent Vitals/Pain Pain Assessment: Faces Faces Pain Scale: No hurt    Home Living                      Prior Function            PT Goals (current goals can now be found in the care plan section) Progress towards PT goals: Progressing toward goals    Frequency    Min 2X/week      PT Plan Current plan remains  appropriate    Co-evaluation              AM-PAC PT "6 Clicks" Daily Activity  Outcome Measure  Difficulty turning over in bed (including adjusting bedclothes, sheets and blankets)?: A Little Difficulty moving from lying on back to sitting on the side of the bed? : Unable Difficulty sitting down on and standing up from a chair with arms (e.g., wheelchair, bedside commode, etc,.)?: Unable Help needed moving to and from a bed to chair (including a wheelchair)?: A Little Help needed walking in hospital room?: A Little Help needed climbing 3-5 steps with a railing? : A Lot 6 Click Score: 13    End of Session Equipment Utilized During Treatment:  Gait belt Activity Tolerance: Patient tolerated treatment well Patient left: in bed;with call bell/phone within reach;with bed alarm set Nurse Communication: Mobility status PT Visit Diagnosis: Unsteadiness on feet (R26.81);Other abnormalities of gait and mobility (R26.89);Muscle weakness (generalized) (M62.81)     Time: [id number] PT Time Calculation (min) (ACUTE ONLY): 16 min  Charges:  $Gait Training: 8-22 mins                    G Codes:       [facility] Dyshawn Cangelosi PT [phone]    Angelina OkCary W [facility] 07/30/2017, 11:31 AM

## 2017-07-30 NOTE — Progress Notes (Signed)
Progress Note  Patient Name: Douglas PentonRalph [name] Date of Encounter: 07/30/2017  Primary Cardiologist: Dr. Armanda Magicraci Noecker, MD   Subjective   Pt remains confused. Denies chest pain. He is eating breakfast and appears comfortable.Wife at bedside. Awaiting SNF placement.   Inpatient Medications    Scheduled Meds: . arformoterol  15 mcg Nebulization BID   And  . umeclidinium bromide  1 puff Inhalation Daily  . aspirin EC  81 mg Oral Daily  . atorvastatin  20 mg Oral q1800  . carvedilol  3.125 mg Oral BID WC  . clopidogrel  75 mg Oral Daily  . donepezil  10 mg Oral QHS  . doxepin  10 mg Oral QHS  . guaiFENesin  600 mg Oral BID  . heparin injection (subcutaneous)  5,000 Units Subcutaneous Q8H  . memantine  10 mg Oral BID   Continuous Infusions: . sodium chloride Stopped (07/29/17 1428)   PRN Meds: acetaminophen, albuterol, ALPRAZolam, guaiFENesin-dextromethorphan, haloperidol, nitroGLYCERIN, ondansetron (ZOFRAN) IV   Vital Signs    Vitals:   07/29/17 2039 07/30/17 0000 07/30/17 0400 07/30/17 0817  BP:   116/80   Pulse:  72 66   Resp:   18   Temp:   98.1 F (36.7 C)   TempSrc:   Oral   SpO2: 98% 97% 98% 98%  Weight:   154 lb 9.6 oz (70.1 kg)   Height:        Intake/Output Summary (Last 24 hours) at 07/30/2017 0915 Last data filed at 07/30/2017 0800 Gross per 24 hour  Intake 1226.58 ml  Output 1950 ml  Net -723.42 ml   Filed Weights   07/26/17 0636 07/29/17 0500 07/30/17 0400  Weight: 148 lb 3.2 oz (67.2 kg) 159 lb 8 oz (72.3 kg) 154 lb 9.6 oz (70.1 kg)    Physical Exam   General: Elderly, frail, NAD Skin: Warm, dry, intact  Head: Normocephalic, atraumatic, clear, moist mucus membranes. Neck: Negative for carotid bruits. No JVD Lungs:Clear to ausculation bilaterally. No wheezes, rales, or rhonchi. Breathing is unlabored. Cardiovascular: RRR with S1 S2. No murmurs, rubs or gallops Abdomen: Soft, non-tender, non-distended with normoactive bowel sounds. No  obvious abdominal masses. MSK: Strength and tone appear normal for age. 5/5 in all extremities Extremities: No edema. No clubbing or cyanosis. DP/PT pulses 1+ bilaterally Neuro: Alert and oriented to self. No focal deficits. No facial asymmetry. MAE spontaneously. Psych: Responds to questions somewhat appropriately with normal affect.    Labs    Chemistry Recent Labs  Lab 07/25/17 0930 07/26/17 0429  NA 137 140  K 3.9 3.8  CL 105 109  CO2 25 22  GLUCOSE 133* 123*  BUN 14 17  CREATININE 1.49* 1.33*  CALCIUM 9.1 8.8*  GFRNONAA 42* 48*  GFRAA 48* 55*  ANIONGAP 7 9    Hematology Recent Labs  Lab 07/28/17 0731 07/29/17 0704 07/30/17 0550  WBC 7.6 6.8 5.5  RBC 3.78* 3.53* 3.54*  HGB 11.8* 11.1* 11.0*  HCT 36.3* 34.1* 34.2*  MCV 96.0 96.6 96.6  MCH 31.2 31.4 31.1  MCHC 32.5 32.6 32.2  RDW 14.0 13.9 13.6  PLT 112* 122* 122*   Cardiac Enzymes Recent Labs  Lab 07/25/17 1616 07/25/17 2120 07/26/17 0429  TROPONINI 1.85* 7.36* 8.08*    Recent Labs  Lab 07/25/17 0950  TROPIPOC 0.01    BNPNo results for input(s): BNP, PROBNP in the last 168 hours.   DDimer No results for input(s): DDIMER in the last 168 hours.  Radiology    No results found.  Telemetry    07/30/17 NSR with HR 77 - Personally Reviewed  ECG    No new tracing as of 07/30/17 - Personally Reviewed  Cardiac Studies   Echocardiogram 07/26/17: Study Conclusions  - Procedure narrative: Transthoracic echocardiography. Image   quality was poor. The study was technically difficult. - Left ventricle: The cavity size was normal. Wall thickness was   increased in a pattern of mild LVH. Systolic function was normal.   The estimated ejection fraction was in the range of 55% to 60%.   There is akinesis of the basal-midinferior myocardium. Doppler   parameters are consistent with abnormal left ventricular   relaxation (grade 1 diastolic dysfunction). - Aortic valve: There was mild regurgitation. -  Aorta: Ascending aortic diameter: 41 mm (S). - Ascending aorta: The ascending aorta was mildly dilated.  Patient Profile     84 y.o. male with past medical history of coronary artery disease admitted with non-ST elevation myocardial infarction.  Also with significant dementia and medical therapy being pursued. Echocardiogram June 2019 showed normal LV function with akinesis of the basal/mid inferior wall. Grade 1 diastolic dysfunction, mild aortic insufficiency and mildly dilated ascending aorta  Assessment & Plan    1. NSTEMI: -Pt not currently a candidate for cardiac catheterization secondary to advanced dementia, age and co-morbid conditions. Plan for medical therapy -Trop, 1.85>7.36>8.08 -Echocardiogram with preserved EF at 55-60% with akinesis of the basal-mid inferior myocardium  -Continue ASA 81 and carvedilol 3.125mg   -Plavix 75 and Lipitor 20 added 07/29/17 -Hb stable at 11.0 with s/s of bleeding   2. Dementia: -CSW working on placement needs. Plans arte for [facility] SNF>>>[employer] authorization began 07/29/17>>awaiting -CSW to follow   3. PAF: -Remains in NSR this AM -Continue carvedilol 3.125mg   -Plan if more frequent episodes of AF>>will consider adding Amiodarone -No anticoagulation secondary to advanced dementia -CHA2DS2VASc =4 (age, CHF, HTN)  4. HTN: -Stable, 116/80>110/99 -Continue current regimen   Signed, Georgie Chard NP-C [facility] Pager: [phone] 07/30/2017, 9:15 AM     For questions or updates, please contact   Please consult [email or web address] for contact info under Cardiology/STEMI.  As outlined plan is for medical therapy for patient's recent non-ST elevation myocardial infarction given significant underlying dementia.  Continue aspirin, Plavix, statin and beta-blocker.  Awaiting placement.  Patient remains in sinus rhythm. Olga Millers, MD

## 2017-07-30 NOTE — Progress Notes (Deleted)
Progress Note  Patient Name: [name] Date of Encounter: 07/30/2017  Primary Cardiologist: Dr. Armanda Magicraci Tudisco, MD   Subjective   Pt remains confused. Denies chest pain. He is eating breakfast and appears comfortable.Wife at bedside. Awaiting SNF placement.   Inpatient Medications    Scheduled Meds: . arformoterol  15 mcg Nebulization BID   And  . umeclidinium bromide  1 puff Inhalation Daily  . aspirin EC  81 mg Oral Daily  . atorvastatin  20 mg Oral q1800  . carvedilol  3.125 mg Oral BID WC  . clopidogrel  75 mg Oral Daily  . donepezil  10 mg Oral QHS  . doxepin  10 mg Oral QHS  . guaiFENesin  600 mg Oral BID  . heparin injection (subcutaneous)  5,000 Units Subcutaneous Q8H  . memantine  10 mg Oral BID   Continuous Infusions: . sodium chloride Stopped (07/29/17 1428)   PRN Meds: acetaminophen, albuterol, ALPRAZolam, guaiFENesin-dextromethorphan, haloperidol, nitroGLYCERIN, ondansetron (ZOFRAN) IV   Vital Signs    Vitals:   07/29/17 2039 07/30/17 0000 07/30/17 0400 07/30/17 0817  BP:   116/80   Pulse:  72 66   Resp:   18   Temp:   98.1 F (36.7 C)   TempSrc:   Oral   SpO2: 98% 97% 98% 98%  Weight:   154 lb 9.6 oz (70.1 kg)   Height:        Intake/Output Summary (Last 24 hours) at 07/30/2017 0823 Last data filed at 07/30/2017 0800 Gross per 24 hour  Intake 1226.58 ml  Output 1950 ml  Net -723.42 ml   Filed Weights   07/26/17 0636 07/29/17 0500 07/30/17 0400  Weight: 148 lb 3.2 oz (67.2 kg) 159 lb 8 oz (72.3 kg) 154 lb 9.6 oz (70.1 kg)    Physical Exam   General: Elderly, frail, NAD Skin: Warm, dry, intact  Head: Normocephalic, atraumatic, clear, moist mucus membranes. Neck: Negative for carotid bruits. No JVD Lungs:Clear to ausculation bilaterally. No wheezes, rales, or rhonchi. Breathing is unlabored. Cardiovascular: RRR with S1 S2. No murmurs, rubs or gallops Abdomen: Soft, non-tender, non-distended with normoactive bowel sounds. No  obvious abdominal masses. MSK: Strength and tone appear normal for age. 5/5 in all extremities Extremities: No edema. No clubbing or cyanosis. DP/PT pulses 1+ bilaterally Neuro: Alert and oriented to self. No focal deficits. No facial asymmetry. MAE spontaneously. Psych: Responds to questions somewhat appropriately with normal affect.    Labs    Chemistry Recent Labs  Lab 07/25/17 0930 07/26/17 0429  NA 137 140  K 3.9 3.8  CL 105 109  CO2 25 22  GLUCOSE 133* 123*  BUN 14 17  CREATININE 1.49* 1.33*  CALCIUM 9.1 8.8*  GFRNONAA 42* 48*  GFRAA 48* 55*  ANIONGAP 7 9    Hematology Recent Labs  Lab 07/28/17 0731 07/29/17 0704 07/30/17 0550  WBC 7.6 6.8 5.5  RBC 3.78* 3.53* 3.54*  HGB 11.8* 11.1* 11.0*  HCT 36.3* 34.1* 34.2*  MCV 96.0 96.6 96.6  MCH 31.2 31.4 31.1  MCHC 32.5 32.6 32.2  RDW 14.0 13.9 13.6  PLT 112* 122* 122*   Cardiac Enzymes Recent Labs  Lab 07/25/17 1616 07/25/17 2120 07/26/17 0429  TROPONINI 1.85* 7.36* 8.08*    Recent Labs  Lab 07/25/17 0950  TROPIPOC 0.01    BNPNo results for input(s): BNP, PROBNP in the last 168 hours.   DDimer No results for input(s): DDIMER in the last 168 hours.  Radiology    No results found.  Telemetry    07/30/17 NSR with HR 77 - Personally Reviewed  ECG    No new tracing as of 07/30/17 - Personally Reviewed  Cardiac Studies   Echocardiogram 07/26/17: Study Conclusions  - Procedure narrative: Transthoracic echocardiography. Image   quality was poor. The study was technically difficult. - Left ventricle: The cavity size was normal. Wall thickness was   increased in a pattern of mild LVH. Systolic function was normal.   The estimated ejection fraction was in the range of 55% to 60%.   There is akinesis of the basal-midinferior myocardium. Doppler   parameters are consistent with abnormal left ventricular   relaxation (grade 1 diastolic dysfunction). - Aortic valve: There was mild regurgitation. -  Aorta: Ascending aortic diameter: 41 mm (S). - Ascending aorta: The ascending aorta was mildly dilated.  Patient Profile     84 y.o. male with past medical history of coronary artery disease admitted with non-ST elevation myocardial infarction.  Also with significant dementia and medical therapy being pursued. Echocardiogram June 2019 showed normal LV function with akinesis of the basal/mid inferior wall. Grade 1 diastolic dysfunction, mild aortic insufficiency and mildly dilated ascending aorta  Assessment & Plan    1. NSTEMI: -Pt not currently a candidate for cardiac catheterization secondary to advanced dementia, age and co-morbid conditions. Plan for medical therapy -Trop, 1.85>7.36>8.08 -Echocardiogram with preserved EF at 55-60% with akinesis of the basal-mid inferior myocardium  -Continue ASA 81 and carvedilol 3.125mg   -Plavix 75 and Lipitor 20 added 07/29/17 -Hb stable at 11.0 with s/s of bleeding   2. Dementia: -CSW working on placement needs. Plans arte for [facility] SNF>>>[employer] authorization began 07/29/17>>awaiting -CSW to follow   3. PAF: -Remains in NSR this AM -Continue carvedilol 3.125mg   -Plan if more frequent episodes of AF>>will consider adding Amiodarone -No anticoagulation secondary to advanced dementia -CHA2DS2VASc =4 (age, CHF, HTN)  4. HTN: -Stable, 116/80>110/99 -Continue current regimen   Signed, Georgie Chard NP-C [facility] Pager: [phone] 07/30/2017, 8:23 AM     For questions or updates, please contact   Please consult [email or web address] for contact info under Cardiology/STEMI.

## 2017-07-31 LAB — CBC
HCT: 33.7 % — ABNORMAL LOW (ref 39.0–52.0)
Hemoglobin: 10.9 g/dL — ABNORMAL LOW (ref 13.0–17.0)
MCH: 30.8 pg (ref 26.0–34.0)
MCHC: 32.3 g/dL (ref 30.0–36.0)
MCV: 95.2 fL (ref 78.0–100.0)
PLATELETS: 146 10*3/uL — AB (ref 150–400)
RBC: 3.54 MIL/uL — AB (ref 4.22–5.81)
RDW: 13.4 % (ref 11.5–15.5)
WBC: 6 10*3/uL (ref 4.0–10.5)

## 2017-07-31 NOTE — Care Management Note (Addendum)
Case Management Note  Patient Details  Name: Veneta PentonRalph A Asato MRN: [id number] Date of Birth: 06/30/1931  Subjective/Objective:  Pt presented for Chest Pain- Increased AMS and AFIb RVR. Initiated on IV Cardizem gtt and weaned to orals. CSW assisting with SNF. Per notes Peer to Peer to be completed by attending.                   Action/Plan: CM will continue to monitor for additional needs.   Expected Discharge Date:                  Expected Discharge Plan:  Skilled Nursing Facility  In-House Referral:  Clinical Social Work  Discharge planning Services  CM Consult  Post Acute Care Choice:  NA Choice offered to:  NA  DME Arranged:  N/A DME Agency:  NA  HH Arranged:  NA HH Agency:  NA  Status of Service:  Completed, signed off  If discussed at [employer] Length of Stay Meetings, dates discussed:  07-30-17, 08-01-17  Additional Comments: 1209 08-01-17 Tomi BambergerBrenda Graves-Bigelow, RN, BSN [phone] [facility]. No further needs from CM at this time.  Gala LewandowskyGraves-Bigelow, Yannis Broce Kaye, RN 07/31/2017, 12:46 PM

## 2017-07-31 NOTE — Progress Notes (Signed)
Patient keeps trying to take his condom cath off, will place him on soft restraints (gloves on both hands).  I will keep monitoring patient.

## 2017-07-31 NOTE — Progress Notes (Signed)
Progress Note  Patient Name: [name] Date of Encounter: 07/31/2017  Primary Cardiologist: Dr. Armanda Magicraci Critz, MD   Subjective   No chest pain or dyspnea; confused  Inpatient Medications    Scheduled Meds: . arformoterol  15 mcg Nebulization BID   And  . umeclidinium bromide  1 puff Inhalation Daily  . aspirin EC  81 mg Oral Daily  . atorvastatin  20 mg Oral q1800  . carvedilol  3.125 mg Oral BID WC  . clopidogrel  75 mg Oral Daily  . donepezil  10 mg Oral QHS  . doxepin  10 mg Oral QHS  . guaiFENesin  600 mg Oral BID  . heparin injection (subcutaneous)  5,000 Units Subcutaneous Q8H  . memantine  10 mg Oral BID   Continuous Infusions: . sodium chloride Stopped (07/29/17 1428)   PRN Meds: acetaminophen, albuterol, ALPRAZolam, guaiFENesin-dextromethorphan, nitroGLYCERIN, ondansetron (ZOFRAN) IV   Vital Signs    Vitals:   07/30/17 2108 07/31/17 0456 07/31/17 0741 07/31/17 0743  BP: 119/79 118/77    Pulse: 73 70    Resp: 16 16    Temp: 98.6 F (37 C) (!) 97.5 F (36.4 C)    TempSrc: Oral Oral    SpO2: 98% 98% 97% 97%  Weight:  153 lb 4.8 oz (69.5 kg)    Height:        Intake/Output Summary (Last 24 hours) at 07/31/2017 0755 Last data filed at 07/31/2017 0248 Gross per 24 hour  Intake -  Output 1300 ml  Net -1300 ml   Filed Weights   07/29/17 0500 07/30/17 0400 07/31/17 0456  Weight: 159 lb 8 oz (72.3 kg) 154 lb 9.6 oz (70.1 kg) 153 lb 4.8 oz (69.5 kg)    Physical Exam   General: Frail, WD NAD Head: Normal Neck: supple Lungs:CTA Cardiovascular: RRR Abdomen: Soft, non-tender, non-distended, no masses Extremities: No edema Neuro: Moves ext; confused   Labs    Chemistry Recent Labs  Lab 07/25/17 0930 07/26/17 0429  NA 137 140  K 3.9 3.8  CL 105 109  CO2 25 22  GLUCOSE 133* 123*  BUN 14 17  CREATININE 1.49* 1.33*  CALCIUM 9.1 8.8*  GFRNONAA 42* 48*  GFRAA 48* 55*  ANIONGAP 7 9     Hematology Recent Labs  Lab 07/29/17 0704  07/30/17 0550 07/31/17 0553  WBC 6.8 5.5 6.0  RBC 3.53* 3.54* 3.54*  HGB 11.1* 11.0* 10.9*  HCT 34.1* 34.2* 33.7*  MCV 96.6 96.6 95.2  MCH 31.4 31.1 30.8  MCHC 32.6 32.2 32.3  RDW 13.9 13.6 13.4  PLT 122* 122* 146*    Cardiac Enzymes Recent Labs  Lab 07/25/17 1616 07/25/17 2120 07/26/17 0429  TROPONINI 1.85* 7.36* 8.08*    Recent Labs  Lab 07/25/17 0950  TROPIPOC 0.01    Cardiac Studies  Echocardiogram 07/26/17: Study Conclusions  - Procedure narrative: Transthoracic echocardiography. Image quality was poor. The study was technically difficult. - Left ventricle: The cavity size was normal. Wall thickness was increased in a pattern of mild LVH. Systolic function was normal. The estimated ejection fraction was in the range of 55% to 60%. There is akinesis of the basal-midinferior myocardium. Doppler parameters are consistent with abnormal left ventricular relaxation (grade 1 diastolic dysfunction). - Aortic valve: There was mild regurgitation. - Aorta: Ascending aortic diameter: 41 mm (S). - Ascending aorta: The ascending aorta was mildly dilated.  Patient Profile     84 y.o. male with past medical history  of coronary artery disease admitted with non-ST elevation myocardial infarction. Also with significant dementia and medical therapy being pursued. Echocardiogram June 2019 showed normal LV function with akinesis of the basal/mid inferior wall. Grade 1 diastolic dysfunction, mild aortic insufficiency and mildly dilated ascending aorta  Assessment & Plan    1. NSTEMI: -Not a candidate for cath due to dementia; Plan for medical therapy -Echocardiogram with preserved EF at 55-60% with akinesis of the basal-mid inferior myocardium  -Continue ASA , plavix and statin; continue coreg  2. Dementia: -Patient with significant dementia.  Thinks he is in [place].  We are discussing placement and awaiting approval from insurance company.  3.  PAF: -Remains in NSR on exam this AM -Continue carvedilol 3.125mg   -Can add amiodarone for more frequent episodes in the future.  He is not a candidate for anticoagulation given severity of dementia.  4. HTN: -Blood pressure controlled.  Continue present medications.    Signed, Olga Millers MD 07/31/2017, 7:55 AM     For questions or updates, please contact   Please consult [email or web address] for contact info under Cardiology/STEMI.

## 2017-07-31 NOTE — Progress Notes (Addendum)
3:53 pm CSW discussed disposition plan with clinical supervisor - patient likely to be denied by insurance for SNF even after peer to peer. [facility] will not be approved given initial denial. CSW spoke with wife on the phone and discussed back up plan for disposition. CSW offered choice to pay privately for SNF or go home with home health services and consider privately paying for aide a few hours a day or arrange support with other family members. Wife has difficulty caring for patient alone.  Wife indicated they cannot afford to private pay for SNF or for aide at home.  Wife insistent that changing to another facility would mean that patient will be approved by [facility]. CSW has reiterated that this is not the case, and [employer] has denied for SNF coverage regardless of facility. Wife now understanding of likely plan for home.  [facility] for home with home health. Did also page MD, [place]. Awaiting determination on peer-to-peer. [facility]. CSW to follow.   10:05 am [employer] has given an initial denial for SNF and physician peer to peer is now required. CSW spoke to Dr. Jens Somrenshaw who will complete peer to peer. Dr. Jens Somrenshaw to call [employer] at [phone] ext [phone]. CSW to follow.  Abigail ButtsSusan Rhyann Berton, LCSWA [phone]

## 2017-08-01 DIAGNOSIS — R278 Other lack of coordination: Secondary | ICD-10-CM | POA: Diagnosis not present

## 2017-08-01 DIAGNOSIS — L98499 Non-pressure chronic ulcer of skin of other sites with unspecified severity: Secondary | ICD-10-CM | POA: Diagnosis not present

## 2017-08-01 DIAGNOSIS — I480 Paroxysmal atrial fibrillation: Secondary | ICD-10-CM | POA: Diagnosis not present

## 2017-08-01 DIAGNOSIS — F0390 Unspecified dementia without behavioral disturbance: Secondary | ICD-10-CM | POA: Diagnosis not present

## 2017-08-01 DIAGNOSIS — Z7401 Bed confinement status: Secondary | ICD-10-CM | POA: Diagnosis not present

## 2017-08-01 DIAGNOSIS — F419 Anxiety disorder, unspecified: Secondary | ICD-10-CM | POA: Diagnosis not present

## 2017-08-01 DIAGNOSIS — F0150 Vascular dementia without behavioral disturbance: Secondary | ICD-10-CM | POA: Diagnosis not present

## 2017-08-01 DIAGNOSIS — I517 Cardiomegaly: Secondary | ICD-10-CM | POA: Diagnosis not present

## 2017-08-01 DIAGNOSIS — Z9861 Coronary angioplasty status: Secondary | ICD-10-CM

## 2017-08-01 DIAGNOSIS — I5020 Unspecified systolic (congestive) heart failure: Secondary | ICD-10-CM | POA: Diagnosis not present

## 2017-08-01 DIAGNOSIS — R0989 Other specified symptoms and signs involving the circulatory and respiratory systems: Secondary | ICD-10-CM | POA: Diagnosis not present

## 2017-08-01 DIAGNOSIS — I10 Essential (primary) hypertension: Secondary | ICD-10-CM | POA: Diagnosis not present

## 2017-08-01 DIAGNOSIS — M6281 Muscle weakness (generalized): Secondary | ICD-10-CM | POA: Diagnosis not present

## 2017-08-01 DIAGNOSIS — G3184 Mild cognitive impairment, so stated: Secondary | ICD-10-CM | POA: Diagnosis not present

## 2017-08-01 DIAGNOSIS — E785 Hyperlipidemia, unspecified: Secondary | ICD-10-CM | POA: Diagnosis not present

## 2017-08-01 DIAGNOSIS — J069 Acute upper respiratory infection, unspecified: Secondary | ICD-10-CM | POA: Diagnosis not present

## 2017-08-01 DIAGNOSIS — R1312 Dysphagia, oropharyngeal phase: Secondary | ICD-10-CM | POA: Diagnosis not present

## 2017-08-01 DIAGNOSIS — W19XXXA Unspecified fall, initial encounter: Secondary | ICD-10-CM | POA: Diagnosis not present

## 2017-08-01 DIAGNOSIS — I214 Non-ST elevation (NSTEMI) myocardial infarction: Secondary | ICD-10-CM | POA: Diagnosis not present

## 2017-08-01 DIAGNOSIS — R296 Repeated falls: Secondary | ICD-10-CM | POA: Diagnosis not present

## 2017-08-01 DIAGNOSIS — R41841 Cognitive communication deficit: Secondary | ICD-10-CM | POA: Diagnosis not present

## 2017-08-01 DIAGNOSIS — R05 Cough: Secondary | ICD-10-CM | POA: Diagnosis not present

## 2017-08-01 DIAGNOSIS — B029 Zoster without complications: Secondary | ICD-10-CM

## 2017-08-01 DIAGNOSIS — F0391 Unspecified dementia with behavioral disturbance: Secondary | ICD-10-CM | POA: Diagnosis not present

## 2017-08-01 DIAGNOSIS — R451 Restlessness and agitation: Secondary | ICD-10-CM | POA: Diagnosis not present

## 2017-08-01 DIAGNOSIS — M2550 Pain in unspecified joint: Secondary | ICD-10-CM | POA: Diagnosis not present

## 2017-08-01 DIAGNOSIS — I2510 Atherosclerotic heart disease of native coronary artery without angina pectoris: Secondary | ICD-10-CM | POA: Diagnosis not present

## 2017-08-01 MED ORDER — CLOPIDOGREL BISULFATE 75 MG PO TABS: 75.0000 mg | ORAL_TABLET | Freq: Every day | ORAL | 4 refills | Status: DC

## 2017-08-01 MED ORDER — CARVEDILOL 3.125 MG PO TABS: 3.1250 mg | ORAL_TABLET | Freq: Two times a day (BID) | ORAL | 4 refills | Status: AC

## 2017-08-01 MED ORDER — ASPIRIN 81 MG PO TBEC: 81.0000 mg | DELAYED_RELEASE_TABLET | Freq: Every day | ORAL | 4 refills | Status: DC

## 2017-08-01 MED ORDER — NITROGLYCERIN 0.4 MG SL SUBL: 0.4000 mg | SUBLINGUAL_TABLET | SUBLINGUAL | 0 refills | Status: AC | PRN

## 2017-08-01 MED ORDER — ATORVASTATIN CALCIUM 20 MG PO TABS: 20.0000 mg | ORAL_TABLET | Freq: Every day | ORAL | 4 refills | Status: AC

## 2017-08-01 NOTE — Progress Notes (Signed)
CS[facility] to [facility] case manager Selena BattenKim [phone] and confirmed patient is approved for SNF. Authorization number is [id number]. CSW updated facility. Paged MD. CSW to support with discharge today.  Abigail ButtsSusan Carinna Newhart, LCSWA [phone]

## 2017-08-01 NOTE — Discharge Summary (Signed)
Discharge Summary    Patient ID: [name],  MRN: [id number], DOB/AGE: [birth year] 84 y.o.  Admit date: 07/25/2017 Discharge date: 08/01/2017  Primary Care Provider: Merrilee Seashore Primary Cardiologist: Fransico Him, MD  Discharge Diagnoses    Principal Problem:   Non-ST elevation (NSTEMI) myocardial infarction [facility]) Active Problems:   Dyslipidemia   CAD (coronary artery disease), native coronary artery   Mild cognitive impairment   Anxiety state   Essential hypertension   Dementia   Chest pain   Goals of care, counseling/discussion   Palliative care by specialist   History of percutaneous coronary intervention   Shingles  Allergies No Known Allergies  Diagnostic Studies/Procedures    Echocardiogram 07/26/17: Study Conclusions  - Procedure narrative: Transthoracic echocardiography. Image quality was poor. The study was technically difficult. - Left ventricle: The cavity size was normal. Wall thickness was increased in a pattern of mild LVH. Systolic function was normal. The estimated ejection fraction was in the range of 55% to 60%. There is akinesis of the basal-midinferior myocardium. Doppler parameters are consistent with abnormal left ventricular relaxation (grade 1 diastolic dysfunction). - Aortic valve: There was mild regurgitation. - Aorta: Ascending aortic diameter: 41 mm (S). - Ascending aorta: The ascending aorta was mildly dilated.  History of Present Illness     84 yo male with PMH of CAD s/p PCI of RCA ('04), HTN, HL, GERD who presented with chest pain on 07/25/17 found to have NSTEMI.   [name] is an 84 year old male with past medical history of CAD status post PCI of the RCA 2004, hypertension, hyperlipidemia, GERD.  He had an abnormal stress test back in 2007, and underwent cardiac cath which showed patent RCA stent, with distal in-stent restenosis of 30 to 50%. He was last seen in the office by Dr. Radford Pax in 2016  preoperatively for shoulder replacement. He underwent stress test which was low risk, and cleared for shoulder surgery at that time.  He has not been seen since this last visit.  He currently lives at home with his wife. It appears that he was seen by neurology back in January of this year. He was also noted to have been hospitalized in [place] back in December 2018 when he had a fall. There was also concern for memory loss, and he was started on Aricept at this visit.  [name] presented to [facility] on 07/25/17 with reports of sudden onset of centralized chest pressure lasting approximately 10 minutes in duration  on day of admission with mild associated shortness of breath. He reported no nausea, vomiting, diaphoresis or radiation of pain. He did not take anything for his symptoms prior to presenting to the ER and was brought to the emergency department by his family for further evaluation.  Hospital Course   In the ED, it was reported that he was pain free. His labs showed stable electrolytes, creatinine of 1.4 and an i-STAT POC troponin which was negative. CXR revealed no acute edema, stable cardiomegaly and mid thoracic vertebral compression fracture which appears to be chronic.  EKG showed sinus rhythm with no acute ST/T wave abnormalities.  His troponins trended upward (0.01>1.85>7.36>8.08) during his hosptial course and he was placed on a heparin gtt for possible catheterization. Unfortunately, patient's hospital course was complicated by acute confusion and agitation including a behavioral issues with the need for continual redirection. He has required a Air cabin crew. After MD discussion with wife, given his functional decline and worsening dementia, the decision  was made to pursue medical management at this time with no pursuit in ischemic evaluation. He was transitioned from heparin gtt to SQ Lovenox to minimize interruptions. He was continued on ASA therapy and was started on Plavix  75 daily along with Lipitor 20 mg daily for risk reduction therapy.   Other hospital issues: Dementia: Wife reports recent decline in mental status and difficulty caring for him at home given behavioral issues. He has seen Dr Leonie Man since 2015 for this, was followed initially treated with Cerefolin and fish oil. His memory worsened and he was started on Aricept. His dementia improved for a bit then worsened again.It has continued to worsen and recently in May 2019 was started on Namenda as a 2nd agent. He recently became incontinent as well.Family states that the cannot handle patient at home anymore, he takes his diapers off and soils the floors. Wife has been caring for him but unable to at this point and they are now requesting facility placement. CSW met with wife this admission to discuss long-term care options. Plans were made for SNF placement however, he was initially declined bed. After peer to peer review performed, the patient has now been accepted at a facility.   Paroxysmal atrial fibrillation with RVR: On 07/28/2017 patient was noted to be in atrial fibrillation with RVR.  He was started initally on IV diltiazem with a single dose of digoxin to help with rate control. He has since converted to NSR with no recurrence. He was started on carvedilol for rate control. Initially he was on Lovenox however, he is not an ideal candidate for long-term anticoagulation given advanced dementia and potential fall risk. CHA2DS2VASc =4 (age, CHF, HTN)  Thrombocytopenia: Stable on day of discharge with no acute signs or symptoms of active bleeding. PCP to follow with subsequent CBC.   HTN: Stable, 113/79, 113/83, 123/74.  Will continue current regimen.  HLD: Lipid panel on 07/26/2017 with CHO-126, HDL-49, LDL-71,trig-30.  Patient was started on statin therapy.  Will need lipid panel and LFTs in approximately 6 weeks.   Postherpetic neuralgia: Wife reported an episode of shingles approximately 2 weeks ago  along his jawline.  Lesions have healed however, patient has been complaining of jaw/ear pain since that time.  Internal medicine was asked to evaluate patient and assist with management. No prescription medication needed at this time per internal medicine.   Consultants: Internal medicine, palliative care  Patient has been seen and examined by Dr. Stanford Breed who feels that he is stable and ready for discharge to skilled nursing facility on 08/01/2017. _____________  Discharge Vitals Blood pressure 113/79, pulse 79, temperature 98.6 F (37 C), temperature source Oral, resp. rate 16, height '5\' 10"'  (1.778 m), weight 154 lb 11.2 oz (70.2 kg), SpO2 98 %.  Filed Weights   07/30/17 0400 07/31/17 0456 08/01/17 0340  Weight: 154 lb 9.6 oz (70.1 kg) 153 lb 4.8 oz (69.5 kg) 154 lb 11.2 oz (70.2 kg)   Labs & Radiologic Studies    CBC Recent Labs    07/30/17 0550 07/31/17 0553  WBC 5.5 6.0  HGB 11.0* 10.9*  HCT 34.2* 33.7*  MCV 96.6 95.2  PLT 122* 146*   Dg Chest 2 View  Result Date: 07/25/2017 CLINICAL DATA:  Chest pain.  Shortness of breath. EXAM: CHEST - 2 VIEW COMPARISON:  09/29/2015. FINDINGS: Frontal view was obtained in an AP apical lordotic projection. Prominence again noted of the ascending thoracic aorta consistent with the aneurysm. This is most likely stable  given projection. Stable cardiomegaly. Lungs are clear. No pleural effusion or pneumothorax. Diffuse thoracic spine osteopenia degenerative change. Stable mild midthoracic vertebral body compression fracture. Left eighth posterior rib fracture. Some degree of callus formation noted suggesting this is old. Right shoulder replacement appears stable. IMPRESSION: 1. Prominence again noted of the ascending thoracic aorta consistent with thoracic aortic aneurysm. Similar findings noted on prior exam. Stable cardiomegaly. No acute pulmonary disease. 2. Stable midthoracic vertebral body compression fracture. Left eighth posterior rib fracture  with some degree of callus formation suggesting that this is old. Electronically Signed   By: Marcello Moores  Register   On: 07/25/2017 10:15   Disposition   Pt is being discharged home today in good condition.  Follow-up Plans & Appointments     Contact information for follow-up providers    Almyra Deforest, [place] Follow up on 08/29/2017.   Specialties:  Cardiology, Radiology Why:  Follow-up appointment will be on 08/29/2017 at 2 PM.  Please arrive at 1:45 PM.  Contact information: [address] [place] [address] [phone]            Contact information for after-discharge care    [facility] .   Service:  Skilled Nursing Contact information: [address] [place] [phone]                 Discharge Instructions    ([place]) Call MD:  Anytime you have any of the following symptoms: 1) 3 pound weight gain in 24 hours or 5 pounds in 1 week 2) shortness of breath, with or without a dry hacking cough 3) swelling in the hands, feet or stomach 4) if you have to sleep on extra pillows at night in order to breathe.   Complete by:  As directed    Call MD for:  difficulty breathing, headache or visual disturbances   Complete by:  As directed    Call MD for:  persistant dizziness or light-headedness   Complete by:  As directed    Call MD for:  persistant nausea and vomiting   Complete by:  As directed    Call MD for:  severe uncontrolled pain   Complete by:  As directed    Call MD for:  temperature >100.4   Complete by:  As directed    Diet - low sodium heart healthy   Complete by:  As directed    Discharge instructions   Complete by:  As directed    If you notice any bleeding such as blood in stool, black tarry stools, blood in urine, nosebleeds or any other unusual bleeding, call your doctor immediately.   Increase activity slowly   Complete by:  As directed       Discharge  Medications   Allergies as of 08/01/2017   No Known Allergies     Medication List    TAKE these medications   ALLERGY 4 MG tablet Generic drug:  chlorpheniramine Take 4 mg by mouth 2 (two) times daily as needed for allergies.   ALPRAZolam 0.25 MG tablet Commonly known as:  XANAX Take 0.25 mg by mouth 2 (two) times daily.   aspirin 81 MG EC tablet Take 1 tablet (81 mg total) by mouth daily. Start taking on:  08/02/2017   atorvastatin 20 MG tablet Commonly known as:  LIPITOR Take 1 tablet (20 mg total) by mouth daily at 6 PM.   carvedilol 3.125 MG tablet Commonly  known as:  COREG Take 1 tablet (3.125 mg total) by mouth 2 (two) times daily with a meal.   clopidogrel 75 MG tablet Commonly known as:  PLAVIX Take 1 tablet (75 mg total) by mouth daily. Start taking on:  08/02/2017   donepezil 10 MG tablet Commonly known as:  ARICEPT Take 1 tablet (10 mg total) by mouth at bedtime. Start 1/2 tablet daily x 4 weeks then 1 tablet daily   doxepin 10 MG capsule Commonly known as:  SINEQUAN Take 10 mg by mouth at bedtime.   fluticasone 50 MCG/ACT nasal spray Commonly known as:  FLONASE Place 1 spray into both nostrils 2 (two) times daily. Reported on 03/07/2015   guaiFENesin 600 MG 12 hr tablet Commonly known as:  MUCINEX Take 600 mg by mouth 2 (two) times daily.   memantine tablet pack Commonly known as:  NAMENDA TITRATION PAK 5 mg/day for =1 week; 5 mg twice daily for =1 week; 15 mg/day given in 5 mg and 10 mg separated doses for =1 week; then 10 mg twice daily   multivitamin with minerals Tabs tablet Take 1 tablet by mouth daily.   nitroGLYCERIN 0.4 MG SL tablet Commonly known as:  NITROSTAT Place 1 tablet (0.4 mg total) under the tongue every 5 (five) minutes x 3 doses as needed for chest pain.   STIOLTO RESPIMAT 2.5-2.5 MCG/ACT Aers Generic drug:  Tiotropium Bromide-Olodaterol Inhale 2 puffs into the lungs at bedtime.   tiotropium 18 MCG inhalation  capsule Commonly known as:  SPIRIVA Place 1 capsule (18 mcg total) into inhaler and inhale daily.        Acute coronary syndrome (MI, NSTEMI, STEMI, etc) this admission?: Yes.     AHA/ACC Clinical Performance & Quality Measures: 1. Aspirin prescribed? - Yes 2. ADP Receptor Inhibitor (Plavix/Clopidogrel, Brilinta/Ticagrelor or Effient/Prasugrel) prescribed (includes medically managed patients)? - Yes 3. Beta Blocker prescribed? - Yes 4. High Intensity Statin (Lipitor 40-44m or Crestor 20-464m prescribed? - No - will increase as tolerated  5. EF assessed during THIS hospitalization? - Yes 6. For EF <40%, was ACEI/ARB prescribed? - Not Applicable (EF >/= 4018%7. For EF <40%, Aldosterone Antagonist (Spironolactone or Eplerenone) prescribed? - Not Applicable (EF >/= 4084%8. Cardiac Rehab Phase II ordered (Included Medically managed Patients)? - No, not applicable secondary to dementia and confusion    Outstanding Labs/Studies   Will need lipid panel and LFTs in approximately 6 weeks  Duration of Discharge Encounter   Greater than 30 minutes including physician time.  Signed, JiSyble CreekNP 08/01/2017, 12:56 PM

## 2017-08-01 NOTE — Progress Notes (Signed)
[facility]. Anticipated discharge date: 08/01/17 Family notified: Juanetta GoslingJoy Sutherlin, spouse Transportation by: PTAR  Nurse to call report to [phone].   CSW signing off.  Abigail ButtsSusan Kariann Wecker, LCSWA  Clinical Social Worker

## 2017-08-01 NOTE — Progress Notes (Signed)
Progress Note  Patient Name: [name] Date of Encounter: 08/01/2017  Primary Cardiologist: Dr. Armanda Magicraci Mak, MD   Subjective   Remains confused, no chest pain or dyspnea  Inpatient Medications    Scheduled Meds: . arformoterol  15 mcg Nebulization BID   And  . umeclidinium bromide  1 puff Inhalation Daily  . aspirin EC  81 mg Oral Daily  . atorvastatin  20 mg Oral q1800  . carvedilol  3.125 mg Oral BID WC  . clopidogrel  75 mg Oral Daily  . donepezil  10 mg Oral QHS  . doxepin  10 mg Oral QHS  . guaiFENesin  600 mg Oral BID  . heparin injection (subcutaneous)  5,000 Units Subcutaneous Q8H  . memantine  10 mg Oral BID   Continuous Infusions: . sodium chloride Stopped (07/29/17 1428)   PRN Meds: acetaminophen, albuterol, ALPRAZolam, guaiFENesin-dextromethorphan, nitroGLYCERIN, ondansetron (ZOFRAN) IV   Vital Signs    Vitals:   07/31/17 2057 08/01/17 0340 08/01/17 0752 08/01/17 0822  BP:  113/83  113/79  Pulse:  79    Resp:  16    Temp:  98.6 F (37 C)    TempSrc:  Oral    SpO2: 95% 100% 98%   Weight:  154 lb 11.2 oz (70.2 kg)    Height:        Intake/Output Summary (Last 24 hours) at 08/01/2017 0927 Last data filed at 07/31/2017 2140 Gross per 24 hour  Intake 440 ml  Output -  Net 440 ml   Filed Weights   07/30/17 0400 07/31/17 0456 08/01/17 0340  Weight: 154 lb 9.6 oz (70.1 kg) 153 lb 4.8 oz (69.5 kg) 154 lb 11.2 oz (70.2 kg)    Physical Exam   General: WD frail, NAD Head: Normal  Neck: supple, no JVD Lungs:CTA, no wheeze Cardiovascular: RRR Abdomen: Soft, NT/ND, no masses Extremities: No edema Neuro: Remains confused   Labs    Chemistry Recent Labs  Lab 07/25/17 0930 07/26/17 0429  NA 137 140  K 3.9 3.8  CL 105 109  CO2 25 22  GLUCOSE 133* 123*  BUN 14 17  CREATININE 1.49* 1.33*  CALCIUM 9.1 8.8*  GFRNONAA 42* 48*  GFRAA 48* 55*  ANIONGAP 7 9     Hematology Recent Labs  Lab 07/29/17 0704 07/30/17 0550  07/31/17 0553  WBC 6.8 5.5 6.0  RBC 3.53* 3.54* 3.54*  HGB 11.1* 11.0* 10.9*  HCT 34.1* 34.2* 33.7*  MCV 96.6 96.6 95.2  MCH 31.4 31.1 30.8  MCHC 32.6 32.2 32.3  RDW 13.9 13.6 13.4  PLT 122* 122* 146*    Cardiac Enzymes Recent Labs  Lab 07/25/17 1616 07/25/17 2120 07/26/17 0429  TROPONINI 1.85* 7.36* 8.08*    Recent Labs  Lab 07/25/17 0950  TROPIPOC 0.01    Cardiac Studies  Echocardiogram 07/26/17: Study Conclusions  - Procedure narrative: Transthoracic echocardiography. Image quality was poor. The study was technically difficult. - Left ventricle: The cavity size was normal. Wall thickness was increased in a pattern of mild LVH. Systolic function was normal. The estimated ejection fraction was in the range of 55% to 60%. There is akinesis of the basal-midinferior myocardium. Doppler parameters are consistent with abnormal left ventricular relaxation (grade 1 diastolic dysfunction). - Aortic valve: There was mild regurgitation. - Aorta: Ascending aortic diameter: 41 mm (S). - Ascending aorta: The ascending aorta was mildly dilated.  Patient Profile     84 y.o. male with past medical history  of coronary artery disease admitted with non-ST elevation myocardial infarction. Also with significant dementia and medical therapy being pursued. Echocardiogram June 2019 showed normal LV function with akinesis of the basal/mid inferior wall. Grade 1 diastolic dysfunction, mild aortic insufficiency and mildly dilated ascending aorta  Assessment & Plan    1. NSTEMI: -Not a candidate for cath due to dementia; Medical therapy only. -Echocardiogram with preserved EF at 55-60% with akinesis of the basal-mid inferior myocardium  -Continue ASA , plavix, coreg and statin.  2. Dementia: -Patient with significant dementia.  Thinks he is in [place].  I called [employer] again today.  By report he has now been approved for SNF.   3. PAF: -Remains in NSR on exam this  AM -Continue carvedilol 3.125mg   -Can add amiodarone for more frequent episodes in the future.  He is not a candidate for anticoagulation given severity of dementia.  4. HTN: -continue present meds   Signed, Olga Millers MD 08/01/2017, 9:27 AM     For questions or updates, please contact   Please consult [email or web address] for contact info under Cardiology/STEMI.

## 2017-08-01 NOTE — Clinical Social Work Placement (Signed)
   CLINICAL SOCIAL WORK PLACEMENT  NOTE  Date:  08/01/2017  Patient Details  Name: [name] MRN: [id number] Date of Birth: 04/27/1931  Clinical Social Work is seeking post-discharge placement for this patient at the Skilled  Nursing Facility level of care (*CSW will initial, date and re-position this form in  chart as items are completed):  Yes   Patient/family provided with [place] Clinical Social Work Department's list of facilities offering this level of care within the geographic area requested by the patient (or if unable, by the patient's family).  Yes   Patient/family informed of their freedom to choose among providers that offer the needed level of care, that participate in Medicare, Medicaid or managed care program needed by the patient, have an available bed and are willing to accept the patient.  Yes   Patient/family informed of [facility]'s ownership interest in [facility] and [facility], as well as of the fact that they are under no obligation to receive care at these facilities.  PASRR submitted to EDS on       PASRR number received on       Existing PASRR number confirmed on 07/29/17     FL2 transmitted to all facilities in geographic area requested by pt/family on 07/29/17     FL2 transmitted to all facilities within larger geographic area on       Patient informed that his/her managed care company has contracts with or will negotiate with certain facilities, including the following:  [facility]     Yes   Patient/family informed of bed offers received.  [facility]     Physician recommends and patient chooses bed at      [facility] 08[facility].  Patient to be transferred to facility by [facility]     Patient family notified on 08/01/17 of transfer.  Name of family member notified:  [name], spouse     PHYSICIAN Please prepare priority discharge summary,  including medications, Please prepare prescriptions     Additional Comment:    _______________________________________________ Abigail ButtsSusan Korri Ask, LCSW 08/01/2017, 12:28 PM

## 2017-08-06 DIAGNOSIS — F0150 Vascular dementia without behavioral disturbance: Secondary | ICD-10-CM | POA: Diagnosis not present

## 2017-08-06 DIAGNOSIS — F419 Anxiety disorder, unspecified: Secondary | ICD-10-CM | POA: Diagnosis not present

## 2017-08-06 DIAGNOSIS — I2510 Atherosclerotic heart disease of native coronary artery without angina pectoris: Secondary | ICD-10-CM | POA: Diagnosis not present

## 2017-08-06 DIAGNOSIS — I10 Essential (primary) hypertension: Secondary | ICD-10-CM | POA: Diagnosis not present

## 2017-08-07 DIAGNOSIS — F0390 Unspecified dementia without behavioral disturbance: Secondary | ICD-10-CM | POA: Diagnosis not present

## 2017-08-07 DIAGNOSIS — R296 Repeated falls: Secondary | ICD-10-CM | POA: Diagnosis not present

## 2017-08-07 DIAGNOSIS — M6281 Muscle weakness (generalized): Secondary | ICD-10-CM | POA: Diagnosis not present

## 2017-08-07 DIAGNOSIS — W19XXXA Unspecified fall, initial encounter: Secondary | ICD-10-CM | POA: Diagnosis not present

## 2017-08-09 DIAGNOSIS — R451 Restlessness and agitation: Secondary | ICD-10-CM | POA: Diagnosis not present

## 2017-08-09 DIAGNOSIS — M6281 Muscle weakness (generalized): Secondary | ICD-10-CM | POA: Diagnosis not present

## 2017-08-09 DIAGNOSIS — F0391 Unspecified dementia with behavioral disturbance: Secondary | ICD-10-CM | POA: Diagnosis not present

## 2017-08-09 DIAGNOSIS — R296 Repeated falls: Secondary | ICD-10-CM | POA: Diagnosis not present

## 2017-08-14 DIAGNOSIS — M6281 Muscle weakness (generalized): Secondary | ICD-10-CM | POA: Diagnosis not present

## 2017-08-14 DIAGNOSIS — F0391 Unspecified dementia with behavioral disturbance: Secondary | ICD-10-CM | POA: Diagnosis not present

## 2017-08-14 DIAGNOSIS — R296 Repeated falls: Secondary | ICD-10-CM | POA: Diagnosis not present

## 2017-08-14 DIAGNOSIS — W19XXXA Unspecified fall, initial encounter: Secondary | ICD-10-CM | POA: Diagnosis not present

## 2017-08-15 DIAGNOSIS — I5020 Unspecified systolic (congestive) heart failure: Secondary | ICD-10-CM | POA: Diagnosis not present

## 2017-08-15 DIAGNOSIS — M6281 Muscle weakness (generalized): Secondary | ICD-10-CM | POA: Diagnosis not present

## 2017-08-15 DIAGNOSIS — L98499 Non-pressure chronic ulcer of skin of other sites with unspecified severity: Secondary | ICD-10-CM | POA: Diagnosis not present

## 2017-08-15 DIAGNOSIS — I480 Paroxysmal atrial fibrillation: Secondary | ICD-10-CM | POA: Diagnosis not present

## 2017-08-15 DIAGNOSIS — F419 Anxiety disorder, unspecified: Secondary | ICD-10-CM | POA: Diagnosis not present

## 2017-08-15 DIAGNOSIS — F0391 Unspecified dementia with behavioral disturbance: Secondary | ICD-10-CM | POA: Diagnosis not present

## 2017-08-19 DIAGNOSIS — R05 Cough: Secondary | ICD-10-CM | POA: Diagnosis not present

## 2017-08-19 DIAGNOSIS — R0989 Other specified symptoms and signs involving the circulatory and respiratory systems: Secondary | ICD-10-CM | POA: Diagnosis not present

## 2017-08-19 DIAGNOSIS — J069 Acute upper respiratory infection, unspecified: Secondary | ICD-10-CM | POA: Diagnosis not present

## 2017-08-22 DIAGNOSIS — R2241 Localized swelling, mass and lump, right lower limb: Secondary | ICD-10-CM | POA: Diagnosis not present

## 2017-08-22 DIAGNOSIS — M79604 Pain in right leg: Secondary | ICD-10-CM | POA: Diagnosis not present

## 2017-08-23 DIAGNOSIS — I5021 Acute systolic (congestive) heart failure: Secondary | ICD-10-CM | POA: Diagnosis not present

## 2017-08-23 DIAGNOSIS — F0281 Dementia in other diseases classified elsewhere with behavioral disturbance: Secondary | ICD-10-CM | POA: Diagnosis not present

## 2017-08-23 DIAGNOSIS — J984 Other disorders of lung: Secondary | ICD-10-CM | POA: Diagnosis not present

## 2017-08-23 DIAGNOSIS — I2510 Atherosclerotic heart disease of native coronary artery without angina pectoris: Secondary | ICD-10-CM | POA: Diagnosis not present

## 2017-08-26 ENCOUNTER — Other Ambulatory Visit: Payer: Self-pay

## 2017-08-26 DIAGNOSIS — E559 Vitamin D deficiency, unspecified: Secondary | ICD-10-CM | POA: Diagnosis not present

## 2017-08-26 DIAGNOSIS — F0281 Dementia in other diseases classified elsewhere with behavioral disturbance: Secondary | ICD-10-CM | POA: Diagnosis not present

## 2017-08-26 DIAGNOSIS — I10 Essential (primary) hypertension: Secondary | ICD-10-CM | POA: Diagnosis not present

## 2017-08-26 DIAGNOSIS — J984 Other disorders of lung: Secondary | ICD-10-CM | POA: Diagnosis not present

## 2017-08-26 DIAGNOSIS — I2510 Atherosclerotic heart disease of native coronary artery without angina pectoris: Secondary | ICD-10-CM | POA: Diagnosis not present

## 2017-08-26 DIAGNOSIS — D510 Vitamin B12 deficiency anemia due to intrinsic factor deficiency: Secondary | ICD-10-CM | POA: Diagnosis not present

## 2017-08-26 DIAGNOSIS — I5021 Acute systolic (congestive) heart failure: Secondary | ICD-10-CM | POA: Diagnosis not present

## 2017-08-26 DIAGNOSIS — I480 Paroxysmal atrial fibrillation: Secondary | ICD-10-CM | POA: Diagnosis not present

## 2017-08-26 NOTE — Patient Outreach (Signed)
[facility] Care Management  08/26/2017  [name] 12/22/1931 [id number]   Transition of care  Referral date: 08/23/17 Referral source: [facility]: [facility]  Telephone call to patient regarding transition of care referral. Contact identified herself as patients wife, [name].  Wife is listed as patients designated party release per patients chart.  Wife states patient is in [facility].  Wife states patient may have to live there. She states patient has Alzheimers.  Wife inquired how to find out about home health if patient is discharged home. RNCM advised wife to speak with the  social worker or discharge planner at [facility] regarding patients discharge needs.   PLAN: RNCM will refer patient to social worker to follow up on discharge disposition.   George InaDavina Shanira Tine RN,BSN,CCM [facility] Telephonic  [phone]

## 2017-08-28 DIAGNOSIS — E7849 Other hyperlipidemia: Secondary | ICD-10-CM | POA: Diagnosis not present

## 2017-08-28 DIAGNOSIS — I2510 Atherosclerotic heart disease of native coronary artery without angina pectoris: Secondary | ICD-10-CM | POA: Diagnosis not present

## 2017-08-28 DIAGNOSIS — J984 Other disorders of lung: Secondary | ICD-10-CM | POA: Diagnosis not present

## 2017-08-28 DIAGNOSIS — F418 Other specified anxiety disorders: Secondary | ICD-10-CM | POA: Diagnosis not present

## 2017-08-29 ENCOUNTER — Encounter: Payer: Self-pay | Admitting: *Deleted

## 2017-08-29 ENCOUNTER — Other Ambulatory Visit: Payer: Self-pay | Admitting: *Deleted

## 2017-08-29 ENCOUNTER — Ambulatory Visit: Payer: 59 | Admitting: Physician Assistant

## 2017-08-29 NOTE — Patient Outreach (Signed)
[facility]) Care Management  08/29/2017  GEREMIAH FUSSELL Sep 28, 1931 [id number]   CSW was able to make initial contact with patient and patient's wife, Devean Skoczylas today to perform the assessment, as well as assess and assist with social work needs and services.  CSW met with patient and Mrs. Liggins at [facility] and [facility] where patient currently resides to receive short-term rehabilitative services.  CSW introduced self, explained role and types of services provided through [facility] ([place] [facility]).  CSW further explained to patient and Mrs. Meline that CSW works with patient's Telephonic RNCM, also with [facility], Quinn Plowman. CSW then explained the reason for the visit, indicating that Mrs. Nyoka Cowden thought that patient would benefit from social work services and resources to assist with discharge planning needs and services from the skilled nursing facility. CSW obtained two HIPAA compliant identifiers from patient, which included patient's name and date of birth. Mrs. Lappe pulled CSW aside to explain to CSW that patient was placed at [facility], from [facility], also a skilled nursing facility, for long-term care services.  Mrs. Mires went on to say that unless patient's condition drastically improves, he will be staying at [facility] for long-term care services.  Mrs. Stay has already made all the appropriate arrangements with the facility and is working with the Education officer, museum at [employer] to complete a [place] Medicaid application through the [place].  Mrs. [place] has been given CSW's contact information and was encouraged to contact CSW directly if anything changes. CSW will perform a case closure on patient, as all goals of treatment have been met from social work standpoint and no  additional social work needs have been identified at this time.  CSW will notify patient's Telephonic RNCM with [facility], Quinn Plowman of CSW's plans to close patient's case.  CSW will fax an update to patient's Primary Care Physician, Dr. Merrilee Seashore to ensure that they are aware of CSW's involvement with patient's plan of care.   Nat Christen, BSW, MSW, LCSW  Licensed Education officer, [facility]  [place] [place] [address] [place], [place] [address] Physical Address-[address], [place], [place] [address] Toll Free Main # [phone] Fax # [phone] Cell # [phone]  Office # [phone] [email or web address]

## 2017-08-29 NOTE — Progress Notes (Deleted)
Cardiology Office Note    Date:  08/29/2017   ID:  [name], DOB [birth year], MRN [id number]  PCP:  Georgianne Fick, MD  Cardiologist:  Dr. Mayford Knife  No chief complaint on file.   History of Present Illness:  [name] is a 84 y.o. male with past medical history of CAD s/p PCI of RCA in 2004, hypertension, hyperlipidemia and GERD who was recently admitted on 07/25/2017 with NSTEMI.  His troponin trended up to greater than 8 and he was placed on heparin drip with consideration of cardiac catheterization.  Unfortunately, patient's hospital course was complicated by acute confusion and did not agitation including behavioral issues with the need for continual redirection.  Given his functional decline in the worsening dementia, after discussing with his wife, the decision was made to pursue medical management.  He was placed on aspirin and Plavix along with Lipitor for risk reduction.  Patient was eventually discharged to a skilled nursing facility.  During this hospitalization course, patient was also noted to be in atrial fibrillation with RVR.  He was initially treated with IV diltiazem and a single dose of digoxin, he subsequently converted to normal sinus rhythm without further recurrence.  He is not a Ideal candidate for long-term systemic anticoagulation given the advanced dementia and the potential fall risk.  He was also evaluated by internal medicine during this admission for possible postherpatic neuralgia.  Yes EKG   Past Medical History:  Diagnosis Date  . Allergic rhinitis   . Arthritis   . Constipation    takes Miralax daily as needed  . Coronary artery disease    s/p remote IWMI 2004 with PCI of the RCA with BMS and repeat cath 2007 due to recurrent CP and abnormal nuclear stress test and cath showed widely patent RCA stent with 30-50% distal stenosis before the takeoff of a PL and PDA and normal LVF.   [name] Depression with anxiety    takes Clonazepam nightly  . GERD  (gastroesophageal reflux disease)    takes Omeprazole and Pepcid daily  . Heart attack (HCC)   . History of bronchitis    pulmonologist is Dr.Wert.  . Hyperlipidemia    takes Atorvastatin daily  . Hypertension    takes Diovan daily  . Joint pain   . Memory loss   . RLS (restless legs syndrome)   . Unspecified vitamin D deficiency   . Vitamin B 12 deficiency     Past Surgical History:  Procedure Laterality Date  . CARDIAC CATHETERIZATION  2004  . cataract surgery    . COLONOSCOPY    . CORONARY ANGIOPLASTY     1 stent  . ESOPHAGOGASTRODUODENOSCOPY    . HAND SURGERY Bilateral   . HERNIA REPAIR Left    inguinal   . NASAL SINUS SURGERY    . REVERSE SHOULDER ARTHROPLASTY Right 04/29/2014   Procedure: REVERSE TOTAL SHOULDER ARTHROPLASTY ;  Surgeon: Francena Hanly, MD;  Location: [facility] OR;  Service: Orthopedics;  Laterality: Right;  . stents      Current Medications: Outpatient Medications Prior to Visit  Medication Sig Dispense Refill  . ALPRAZolam (XANAX) 0.25 MG tablet Take 0.25 mg by mouth 2 (two) times daily.  3  . aspirin EC 81 MG EC tablet Take 1 tablet (81 mg total) by mouth daily. 90 tablet 4  . atorvastatin (LIPITOR) 20 MG tablet Take 1 tablet (20 mg total) by mouth daily at 6 PM. 90 tablet 4  . carvedilol (COREG) 3.125  MG tablet Take 1 tablet (3.125 mg total) by mouth 2 (two) times daily with a meal. 180 tablet 4  . chlorpheniramine (ALLERGY) 4 MG tablet Take 4 mg by mouth 2 (two) times daily as needed for allergies.    [name] clopidogrel (PLAVIX) 75 MG tablet Take 1 tablet (75 mg total) by mouth daily. 90 tablet 4  . donepezil (ARICEPT) 10 MG tablet Take 1 tablet (10 mg total) by mouth at bedtime. Start 1/2 tablet daily x 4 weeks then 1 tablet daily 30 tablet 3  . doxepin (SINEQUAN) 10 MG capsule Take 10 mg by mouth at bedtime.    . fluticasone (FLONASE) 50 MCG/ACT nasal spray Place 1 spray into both nostrils 2 (two) times daily. Reported on 03/07/2015    . guaiFENesin (MUCINEX)  600 MG 12 hr tablet Take 600 mg by mouth 2 (two) times daily.    . memantine (NAMENDA TITRATION PAK) tablet pack 5 mg/day for =1 week; 5 mg twice daily for =1 week; 15 mg/day given in 5 mg and 10 mg separated doses for =1 week; then 10 mg twice daily 49 tablet 0  . Multiple Vitamin (MULTIVITAMIN WITH MINERALS) TABS tablet Take 1 tablet by mouth daily.    . nitroGLYCERIN (NITROSTAT) 0.4 MG SL tablet Place 1 tablet (0.4 mg total) under the tongue every 5 (five) minutes x 3 doses as needed for chest pain. 25 tablet 0  . tiotropium (SPIRIVA) 18 MCG inhalation capsule Place 1 capsule (18 mcg total) into inhaler and inhale daily. (Patient not taking: Reported on 07/25/2017) 30 capsule 0  . Tiotropium Bromide-Olodaterol (STIOLTO RESPIMAT) 2.5-2.5 MCG/ACT AERS Inhale 2 puffs into the lungs at bedtime.     No facility-administered medications prior to visit.      Allergies:   Patient has no known allergies.   Social History   Socioeconomic History  . Marital status: Married    Spouse name: Not on file  . Number of children: 4  . Years of education: 12th  . Highest education level: Not on file  Occupational History  . Occupation: retired  Engineer, production  . Financial resource strain: Not on file  . Food insecurity:    Worry: Not on file    Inability: Not on file  . Transportation needs:    Medical: Not on file    Non-medical: Not on file  Tobacco Use  . Smoking status: Former Smoker    Packs/day: 3.00    Years: 15.00    Pack years: 45.00    Types: Cigarettes    Last attempt to quit: 02/06/1964    Years since quitting: 53.5  . Smokeless tobacco: Never Used  Substance and Sexual Activity  . Alcohol use: No    Alcohol/week: 0.0 oz  . Drug use: No  . Sexual activity: Not Currently  Lifestyle  . Physical activity:    Days per week: Not on file    Minutes per session: Not on file  . Stress: Not on file  Relationships  . Social connections:    Talks on phone: Not on file    Gets  together: Not on file    Attends religious service: Not on file    Active member of club or organization: Not on file    Attends meetings of clubs or organizations: Not on file    Relationship status: Not on file  Other Topics Concern  . Not on file  Social History Narrative   Patient lives at home with his  wife.     Family History:  The patient's ***family history includes Asthma in his mother; Cancer - Prostate in his father.   ROS:   Please see the history of present illness.    ROS All other systems reviewed and are negative.   PHYSICAL EXAM:   VS:  There were no vitals taken for this visit.   GEN: Well nourished, well developed, in no acute distress  HEENT: normal  Neck: no JVD, carotid bruits, or masses Cardiac: ***RRR; no murmurs, rubs, or gallops,no edema  Respiratory:  clear to auscultation bilaterally, normal work of breathing GI: soft, nontender, nondistended, + BS MS: no deformity or atrophy  Skin: warm and dry, no rash Neuro:  Alert and Oriented x 3, Strength and sensation are intact Psych: euthymic mood, full affect  Wt Readings from Last 3 Encounters:  08/01/17 154 lb 11.2 oz (70.2 kg)  06/17/17 155 lb 6.4 oz (70.5 kg)  02/19/17 157 lb 12.8 oz (71.6 kg)      Studies/Labs Reviewed:   EKG:  EKG is*** ordered today.  The ekg ordered today demonstrates ***  Recent Labs: 07/26/2017: BUN 17; Creatinine, Ser 1.33; Potassium 3.8; Sodium 140 07/31/2017: Hemoglobin 10.9; Platelets 146   Lipid Panel    Component Value Date/Time   CHOL 126 07/26/2017 0429   TRIG 30 07/26/2017 0429   HDL 49 07/26/2017 0429   CHOLHDL 2.6 07/26/2017 0429   VLDL 6 07/26/2017 0429   LDLCALC 71 07/26/2017 0429    Additional studies/ records that were reviewed today include:  ***    ASSESSMENT:    No diagnosis found.   PLAN:  In order of problems listed above:  1. ***    Medication Adjustments/Labs and Tests Ordered: Current medicines are reviewed at length with  the patient today.  Concerns regarding medicines are outlined above.  Medication changes, Labs and Tests ordered today are listed in the Patient Instructions below. There are no Patient Instructions on file for this visit.   Ramond DialSigned, Deshawn Skelley, [place]  08/29/2017 1:13 PM    [facility] [address], [place], [place]  [address] Phone: [phone]; Fax: [phone]

## 2017-08-30 ENCOUNTER — Encounter: Payer: Self-pay | Admitting: *Deleted

## 2017-09-02 DIAGNOSIS — I5021 Acute systolic (congestive) heart failure: Secondary | ICD-10-CM | POA: Diagnosis not present

## 2017-09-02 DIAGNOSIS — J984 Other disorders of lung: Secondary | ICD-10-CM | POA: Diagnosis not present

## 2017-09-02 DIAGNOSIS — I2510 Atherosclerotic heart disease of native coronary artery without angina pectoris: Secondary | ICD-10-CM | POA: Diagnosis not present

## 2017-09-02 DIAGNOSIS — F0281 Dementia in other diseases classified elsewhere with behavioral disturbance: Secondary | ICD-10-CM | POA: Diagnosis not present

## 2017-09-12 ENCOUNTER — Ambulatory Visit: Payer: Self-pay | Admitting: *Deleted

## 2017-09-13 DIAGNOSIS — F0281 Dementia in other diseases classified elsewhere with behavioral disturbance: Secondary | ICD-10-CM | POA: Diagnosis not present

## 2017-09-13 DIAGNOSIS — R2681 Unsteadiness on feet: Secondary | ICD-10-CM | POA: Diagnosis not present

## 2017-09-13 DIAGNOSIS — K219 Gastro-esophageal reflux disease without esophagitis: Secondary | ICD-10-CM | POA: Diagnosis not present

## 2017-09-13 DIAGNOSIS — F0391 Unspecified dementia with behavioral disturbance: Secondary | ICD-10-CM | POA: Diagnosis not present

## 2017-09-13 DIAGNOSIS — R1312 Dysphagia, oropharyngeal phase: Secondary | ICD-10-CM | POA: Diagnosis not present

## 2017-09-13 DIAGNOSIS — R296 Repeated falls: Secondary | ICD-10-CM | POA: Diagnosis not present

## 2017-09-13 DIAGNOSIS — J984 Other disorders of lung: Secondary | ICD-10-CM | POA: Diagnosis not present

## 2017-09-13 DIAGNOSIS — I5021 Acute systolic (congestive) heart failure: Secondary | ICD-10-CM | POA: Diagnosis not present

## 2017-09-13 DIAGNOSIS — I2510 Atherosclerotic heart disease of native coronary artery without angina pectoris: Secondary | ICD-10-CM | POA: Diagnosis not present

## 2017-09-13 DIAGNOSIS — R262 Difficulty in walking, not elsewhere classified: Secondary | ICD-10-CM | POA: Diagnosis not present

## 2017-09-16 DIAGNOSIS — F0391 Unspecified dementia with behavioral disturbance: Secondary | ICD-10-CM | POA: Diagnosis not present

## 2017-09-16 DIAGNOSIS — R262 Difficulty in walking, not elsewhere classified: Secondary | ICD-10-CM | POA: Diagnosis not present

## 2017-09-16 DIAGNOSIS — I2510 Atherosclerotic heart disease of native coronary artery without angina pectoris: Secondary | ICD-10-CM | POA: Diagnosis not present

## 2017-09-16 DIAGNOSIS — R296 Repeated falls: Secondary | ICD-10-CM | POA: Diagnosis not present

## 2017-09-16 DIAGNOSIS — K219 Gastro-esophageal reflux disease without esophagitis: Secondary | ICD-10-CM | POA: Diagnosis not present

## 2017-09-16 DIAGNOSIS — R2681 Unsteadiness on feet: Secondary | ICD-10-CM | POA: Diagnosis not present

## 2017-09-16 DIAGNOSIS — R1312 Dysphagia, oropharyngeal phase: Secondary | ICD-10-CM | POA: Diagnosis not present

## 2017-09-17 ENCOUNTER — Telehealth: Payer: Self-pay | Admitting: Adult Health

## 2017-09-17 DIAGNOSIS — R262 Difficulty in walking, not elsewhere classified: Secondary | ICD-10-CM | POA: Diagnosis not present

## 2017-09-17 DIAGNOSIS — K219 Gastro-esophageal reflux disease without esophagitis: Secondary | ICD-10-CM | POA: Diagnosis not present

## 2017-09-17 DIAGNOSIS — R1312 Dysphagia, oropharyngeal phase: Secondary | ICD-10-CM | POA: Diagnosis not present

## 2017-09-17 DIAGNOSIS — R2681 Unsteadiness on feet: Secondary | ICD-10-CM | POA: Diagnosis not present

## 2017-09-17 DIAGNOSIS — R296 Repeated falls: Secondary | ICD-10-CM | POA: Diagnosis not present

## 2017-09-17 DIAGNOSIS — F0391 Unspecified dementia with behavioral disturbance: Secondary | ICD-10-CM | POA: Diagnosis not present

## 2017-09-17 DIAGNOSIS — I2510 Atherosclerotic heart disease of native coronary artery without angina pectoris: Secondary | ICD-10-CM | POA: Diagnosis not present

## 2017-09-17 NOTE — Telephone Encounter (Addendum)
Pt's wife said he had a 2nd heart attack in June and is in rehab. She said he is stuffing towels down the toilet to block it up, taking other peoples clothes and wearing them, etc. She said this the 2nd rehab facility he's been sent to since his behavior is so bad. She said he is in "lock down" unit due to his behavior. She said he has really regressed. He had an appt with Shanda BumpsJessica tomorrow and she is wanting to know if that should be r/s or would that be more upsetting to him? Please call to advise

## 2017-09-18 ENCOUNTER — Encounter (HOSPITAL_COMMUNITY): Payer: Self-pay | Admitting: Emergency Medicine

## 2017-09-18 ENCOUNTER — Emergency Department (HOSPITAL_COMMUNITY): Payer: Medicare HMO

## 2017-09-18 ENCOUNTER — Ambulatory Visit: Payer: Medicare HMO | Admitting: Adult Health

## 2017-09-18 ENCOUNTER — Emergency Department (HOSPITAL_COMMUNITY)
Admission: EM | Admit: 2017-09-18 | Discharge: 2017-09-18 | Disposition: A | Discharge status: Home / Self Care | Payer: Medicare HMO | Attending: Emergency Medicine | Admitting: Emergency Medicine

## 2017-09-18 DIAGNOSIS — S0990XA Unspecified injury of head, initial encounter: Secondary | ICD-10-CM | POA: Diagnosis not present

## 2017-09-18 DIAGNOSIS — Y998 Other external cause status: Secondary | ICD-10-CM | POA: Diagnosis not present

## 2017-09-18 DIAGNOSIS — Z79899 Other long term (current) drug therapy: Secondary | ICD-10-CM | POA: Diagnosis not present

## 2017-09-18 DIAGNOSIS — I10 Essential (primary) hypertension: Secondary | ICD-10-CM | POA: Insufficient documentation

## 2017-09-18 DIAGNOSIS — W19XXXA Unspecified fall, initial encounter: Secondary | ICD-10-CM | POA: Insufficient documentation

## 2017-09-18 DIAGNOSIS — F0390 Unspecified dementia without behavioral disturbance: Secondary | ICD-10-CM | POA: Insufficient documentation

## 2017-09-18 DIAGNOSIS — I4891 Unspecified atrial fibrillation: Secondary | ICD-10-CM | POA: Diagnosis not present

## 2017-09-18 DIAGNOSIS — Y939 Activity, unspecified: Secondary | ICD-10-CM | POA: Diagnosis not present

## 2017-09-18 DIAGNOSIS — J449 Chronic obstructive pulmonary disease, unspecified: Secondary | ICD-10-CM | POA: Diagnosis not present

## 2017-09-18 DIAGNOSIS — R2681 Unsteadiness on feet: Secondary | ICD-10-CM | POA: Diagnosis not present

## 2017-09-18 DIAGNOSIS — Z87891 Personal history of nicotine dependence: Secondary | ICD-10-CM | POA: Insufficient documentation

## 2017-09-18 DIAGNOSIS — Y92129 Unspecified place in nursing home as the place of occurrence of the external cause: Secondary | ICD-10-CM | POA: Diagnosis not present

## 2017-09-18 DIAGNOSIS — Z7401 Bed confinement status: Secondary | ICD-10-CM | POA: Diagnosis not present

## 2017-09-18 DIAGNOSIS — R262 Difficulty in walking, not elsewhere classified: Secondary | ICD-10-CM | POA: Diagnosis not present

## 2017-09-18 DIAGNOSIS — I2510 Atherosclerotic heart disease of native coronary artery without angina pectoris: Secondary | ICD-10-CM | POA: Insufficient documentation

## 2017-09-18 DIAGNOSIS — S0083XA Contusion of other part of head, initial encounter: Secondary | ICD-10-CM | POA: Diagnosis not present

## 2017-09-18 DIAGNOSIS — S0993XA Unspecified injury of face, initial encounter: Secondary | ICD-10-CM | POA: Diagnosis not present

## 2017-09-18 DIAGNOSIS — R1312 Dysphagia, oropharyngeal phase: Secondary | ICD-10-CM | POA: Diagnosis not present

## 2017-09-18 DIAGNOSIS — S199XXA Unspecified injury of neck, initial encounter: Secondary | ICD-10-CM | POA: Diagnosis not present

## 2017-09-18 DIAGNOSIS — Z7982 Long term (current) use of aspirin: Secondary | ICD-10-CM | POA: Insufficient documentation

## 2017-09-18 DIAGNOSIS — Z7902 Long term (current) use of antithrombotics/antiplatelets: Secondary | ICD-10-CM | POA: Diagnosis not present

## 2017-09-18 DIAGNOSIS — F0391 Unspecified dementia with behavioral disturbance: Secondary | ICD-10-CM | POA: Diagnosis not present

## 2017-09-18 DIAGNOSIS — R296 Repeated falls: Secondary | ICD-10-CM | POA: Diagnosis not present

## 2017-09-18 DIAGNOSIS — M2550 Pain in unspecified joint: Secondary | ICD-10-CM | POA: Diagnosis not present

## 2017-09-18 DIAGNOSIS — K219 Gastro-esophageal reflux disease without esophagitis: Secondary | ICD-10-CM | POA: Diagnosis not present

## 2017-09-18 LAB — CBC WITH DIFFERENTIAL/PLATELET
ABS IMMATURE GRANULOCYTES: 0 10*3/uL (ref 0.0–0.1)
BASOS ABS: 0 10*3/uL (ref 0.0–0.1)
Basophils Relative: 1 %
Eosinophils Absolute: 0.2 10*3/uL (ref 0.0–0.7)
Eosinophils Relative: 3 %
HCT: 35.2 % — ABNORMAL LOW (ref 39.0–52.0)
HEMOGLOBIN: 11 g/dL — AB (ref 13.0–17.0)
Immature Granulocytes: 0 %
LYMPHS PCT: 18 %
Lymphs Abs: 1 10*3/uL (ref 0.7–4.0)
MCH: 30.5 pg (ref 26.0–34.0)
MCHC: 31.3 g/dL (ref 30.0–36.0)
MCV: 97.5 fL (ref 78.0–100.0)
Monocytes Absolute: 0.5 10*3/uL (ref 0.1–1.0)
Monocytes Relative: 10 %
NEUTROS ABS: 3.7 10*3/uL (ref 1.7–7.7)
NEUTROS PCT: 68 %
Platelets: 154 10*3/uL (ref 150–400)
RBC: 3.61 MIL/uL — AB (ref 4.22–5.81)
RDW: 15.8 % — ABNORMAL HIGH (ref 11.5–15.5)
WBC: 5.4 10*3/uL (ref 4.0–10.5)

## 2017-09-18 LAB — BASIC METABOLIC PANEL
Anion gap: 7 (ref 5–15)
BUN: 12 mg/dL (ref 8–23)
CHLORIDE: 107 mmol/L (ref 98–111)
CO2: 26 mmol/L (ref 22–32)
CREATININE: 1.25 mg/dL — AB (ref 0.61–1.24)
Calcium: 8.7 mg/dL — ABNORMAL LOW (ref 8.9–10.3)
GFR calc non Af Amer: 51 mL/min — ABNORMAL LOW (ref >60)
GFR, EST AFRICAN AMERICAN: 60 mL/min — AB (ref >60)
Glucose, Bld: 91 mg/dL (ref 70–99)
POTASSIUM: 3.6 mmol/L (ref 3.5–5.1)
SODIUM: 140 mmol/L (ref 135–145)

## 2017-09-18 LAB — I-STAT TROPONIN, ED: TROPONIN I, POC: 0.01 ng/mL (ref 0.00–0.08)

## 2017-09-18 NOTE — ED Provider Notes (Signed)
[facility] EMERGENCY DEPARTMENT Provider Note   CSN: [id number] Arrival date & time: 09/18/17  1032     History   Chief Complaint Chief Complaint  Patient presents with  . Fall    HPI [name] is a 84 y.o. male with history of CAD status post stents, hypertension, hyperlipidemia, GERD, Alzheimer's is brought to the ER from nursing facility for evaluation of unwitnessed fall approx 6 hours ago.  Level 5 caveat applies given history of dementia. Pt is oriented to self, place, year but does not know why he is in the ER.  He does not remember falling but states "they said I fell again".  He has no pain, HA, vision changes, nausea, neck pain, chest pain, SOB, abdominal pain.  Offers no physical complaints today.  Per triage note, pt has h/o dementia and frequent falls, last one 2 days ago.  VS WNL en route to ER. On plavix.   HPI  Past Medical History:  Diagnosis Date  . Allergic rhinitis   . Arthritis   . Constipation    takes Miralax daily as needed  . Coronary artery disease    s/p remote IWMI 2004 with PCI of the RCA with BMS and repeat cath 2007 due to recurrent CP and abnormal nuclear stress test and cath showed widely patent RCA stent with 30-50% distal stenosis before the takeoff of a PL and PDA and normal LVF.   [name] Depression with anxiety    takes Clonazepam nightly  . GERD (gastroesophageal reflux disease)    takes Omeprazole and Pepcid daily  . Heart attack (HCC)   . History of bronchitis    pulmonologist is Dr.Wert.  . Hyperlipidemia    takes Atorvastatin daily  . Hypertension    takes Diovan daily  . Joint pain   . Memory loss   . RLS (restless legs syndrome)   . Unspecified vitamin D deficiency   . Vitamin B 12 deficiency     Patient Active Problem List   Diagnosis Date Noted  . History of percutaneous coronary intervention 08/01/2017  . Shingles 08/01/2017  . Goals of care, counseling/discussion   . Palliative care by specialist     . Chest pain 07/25/2017  . Dementia 03/07/2015  . Fall (on) (from) other stairs and steps, sequela 03/07/2015  . S/P shoulder replacement 04/29/2014  . Essential hypertension 03/11/2014  . Mild cognitive impairment 03/12/2013  . Anxiety state 03/12/2013  . CELLULITIS AND ABSCESS OF UPPER ARM AND FOREARM 07/05/2009  . Dyslipidemia 03/21/2007  . Non-ST elevation (NSTEMI) myocardial infarction (HCC) 03/21/2007  . ALLERGIC RHINITIS 03/21/2007  . COUGH 01/14/2007  . CAD (coronary artery disease), native coronary artery 01/13/2007  . CHRONIC RHINITIS 01/13/2007  . COPD pfts pending  01/13/2007  . Esophageal reflux 01/13/2007  . DYSPNEA 01/13/2007    Past Surgical History:  Procedure Laterality Date  . CARDIAC CATHETERIZATION  2004  . cataract surgery    . COLONOSCOPY    . CORONARY ANGIOPLASTY     1 stent  . ESOPHAGOGASTRODUODENOSCOPY    . HAND SURGERY Bilateral   . HERNIA REPAIR Left    inguinal   . NASAL SINUS SURGERY    . REVERSE SHOULDER ARTHROPLASTY Right 04/29/2014   Procedure: REVERSE TOTAL SHOULDER ARTHROPLASTY ;  Surgeon: Francena Hanly, MD;  Location: [facility] OR;  Service: Orthopedics;  Laterality: Right;  . stents          Home Medications    Prior to Admission  medications   Medication Sig Start Date End Date Taking? Authorizing Provider  ALPRAZolam (XANAX) 0.25 MG tablet Take 0.25 mg by mouth 2 (two) times daily. 07/03/17   [provider]  aspirin EC 81 MG EC tablet Take 1 tablet (81 mg total) by mouth daily. 08/02/17   Filbert Schilder, NP  atorvastatin (LIPITOR) 20 MG tablet Take 1 tablet (20 mg total) by mouth daily at 6 PM. 08/01/17   Filbert Schilder, NP  carvedilol (COREG) 3.125 MG tablet Take 1 tablet (3.125 mg total) by mouth 2 (two) times daily with a meal. 08/01/17   Georgie Chard D, NP  chlorpheniramine (ALLERGY) 4 MG tablet Take 4 mg by mouth 2 (two) times daily as needed for allergies.    [provider]  clopidogrel (PLAVIX) 75 MG tablet  Take 1 tablet (75 mg total) by mouth daily. 08/02/17   Filbert Schilder, NP  donepezil (ARICEPT) 10 MG tablet Take 1 tablet (10 mg total) by mouth at bedtime. Start 1/2 tablet daily x 4 weeks then 1 tablet daily 02/19/17   Micki Riley, MD  doxepin (SINEQUAN) 10 MG capsule Take 10 mg by mouth at bedtime.    [provider]  fluticasone (FLONASE) 50 MCG/ACT nasal spray Place 1 spray into both nostrils 2 (two) times daily. Reported on 03/07/2015    [provider]  guaiFENesin (MUCINEX) 600 MG 12 hr tablet Take 600 mg by mouth 2 (two) times daily.    [provider]  memantine (NAMENDA TITRATION PAK) tablet pack 5 mg/day for =1 week; 5 mg twice daily for =1 week; 15 mg/day given in 5 mg and 10 mg separated doses for =1 week; then 10 mg twice daily 06/17/17   George Hugh, NP  Multiple Vitamin (MULTIVITAMIN WITH MINERALS) TABS tablet Take 1 tablet by mouth daily.    [provider]  nitroGLYCERIN (NITROSTAT) 0.4 MG SL tablet Place 1 tablet (0.4 mg total) under the tongue every 5 (five) minutes x 3 doses as needed for chest pain. 08/01/17   Filbert Schilder, NP  tiotropium (SPIRIVA) 18 MCG inhalation capsule Place 1 capsule (18 mcg total) into inhaler and inhale daily. Patient not taking: Reported on 07/25/2017 03/31/16   Lurene Shadow, PA-C  Tiotropium Bromide-Olodaterol (STIOLTO RESPIMAT) 2.5-2.5 MCG/ACT AERS Inhale 2 puffs into the lungs at bedtime.    [provider]    Family History Family History  Problem Relation Age of Onset  . Asthma Mother   . Cancer - Prostate Father     Social History Social History   Tobacco Use  . Smoking status: Former Smoker    Packs/day: 3.00    Years: 15.00    Pack years: 45.00    Types: Cigarettes    Last attempt to quit: 02/06/1964    Years since quitting: 53.6  . Smokeless tobacco: Never Used  Substance Use Topics  . Alcohol use: No    Alcohol/week: 0.0 standard drinks  . Drug use: No      Allergies   Patient has no known allergies.   Review of Systems Review of Systems  Unable to perform ROS: Dementia  Skin: Positive for color change.  Psychiatric/Behavioral: Positive for confusion.  All other systems reviewed and are negative.    Physical Exam Updated Vital Signs BP (!) 115/95   Pulse 78   Temp (!) 97.5 F (36.4 C) (Oral)   Resp 14   SpO2 100%   Physical Exam  Constitutional:  He is oriented to person, place, and time. He appears well-developed and well-nourished. He is cooperative. He is easily aroused. No distress.  Awake, cooperative.  In no distress.  HENT:  Head: Head is with contusion.  Large right frontal hematoma with ecchymosis, tenderness, no surrounding crepitus or defect.  No tenderness to nasal or scalp bones No Raccoon's eyes. No Battle's sign. No hemotympanum or otorrhea, bilaterally. No epistaxis or rhinorrhea, septum midline.  No intraoral bleeding or injury. No malocclusion.   Eyes: Conjunctivae are normal.  Lids normal. EOMs and PERRL intact.   Neck:  C-spine: no midline or paraspinal muscular tenderness. Full active ROM of cervical spine w/o pain. Trachea midline  Cardiovascular: Normal rate, regular rhythm, S1 normal, S2 normal and normal heart sounds. Exam reveals no distant heart sounds.  Pulses:      Carotid pulses are 2+ on the right side, and 2+ on the left side.      Radial pulses are 2+ on the right side, and 2+ on the left side.       Dorsalis pedis pulses are 2+ on the right side, and 2+ on the left side.  2+ radial and DP pulses bilaterally  Pulmonary/Chest: Effort normal and breath sounds normal. He has no decreased breath sounds.  No anterior/posterior thorax tenderness. Equal and symmetric chest wall expansion   Abdominal: Soft. There is no tenderness.  Musculoskeletal: Normal range of motion. He exhibits no deformity.  Full PROM of upper and lower extremities without pain T-spine: no paraspinal muscular  tenderness or midline tenderness.   L-spine: no paraspinal muscular or midline tenderness.  Pelvis: no instability with AP/L compression, leg shortening or rotation. Full PROM of hips bilaterally without pain.   Neurological: He is alert, oriented to person, place, and time and easily aroused.  Speech is fluent without obvious dysarthria or dysphasia. Strength 5/5 with hand grip and ankle F/E.   Sensation to light touch intact in hands and feet. No trunk sway on bed. No pronator drift. No leg drop.  Normal finger-to-nose and finger tapping.  CN I, II and VIII not tested. CN II-XII grossly intact bilaterally.   Skin: Skin is warm and dry. Capillary refill takes less than 2 seconds.  Psychiatric: His behavior is normal. Thought content normal.     ED Treatments / Results  Labs (all labs ordered are listed, but only abnormal results are displayed) Labs Reviewed  CBC WITH DIFFERENTIAL/PLATELET - Abnormal; Notable for the following components:      Result Value   RBC 3.61 (*)    Hemoglobin 11.0 (*)    HCT 35.2 (*)    RDW 15.8 (*)    All other components within normal limits  BASIC METABOLIC PANEL - Abnormal; Notable for the following components:   Creatinine, Ser 1.25 (*)    Calcium 8.7 (*)    GFR calc non Af Amer 51 (*)    GFR calc Af Amer 60 (*)    All other components within normal limits  I-STAT TROPONIN, ED    EKG EKG Interpretation  Date/Time:  Wednesday September 18 2017 11:36:42 EDT Ventricular Rate:  79 PR Interval:    QRS Duration: 134 QT Interval:  399 QTC Calculation: 458 R Axis:   -47 Text Interpretation:  Atrial fibrillation IVCD, consider atypical RBBB Inferior infarct, old Lateral leads are also involved Baseline wander in lead(s) V2 Interpretation limited secondary to artifact Confirmed by Vanetta MuldersZackowski, Scott [phone]([id number]) on 09/18/2017 11:39:49 AM Also confirmed by Vanetta MuldersZackowski, Scott [phone]([id number]),  editor Barbette Hair [phone])  on 09/18/2017 12:07:19 PM   Radiology Ct Head Wo  Contrast  Result Date: 09/18/2017 CLINICAL DATA:  84 year old male status post unwitnessed fall 6 hours ago. Forehead bruising. EXAM: CT HEAD WITHOUT CONTRAST CT CERVICAL SPINE WITHOUT CONTRAST TECHNIQUE: Multidetector CT imaging of the head and cervical spine was performed following the standard protocol without intravenous contrast. Multiplanar CT image reconstructions of the cervical spine were also generated. COMPARISON:  Head CT without contrast 03/22/2015. Head and cervical spine CT 05/23/2014. FINDINGS: CT HEAD FINDINGS Brain: Mild additional generalized cerebral volume loss since 2016. No midline shift, ventriculomegaly, mass effect, evidence of mass lesion, intracranial hemorrhage or evidence of cortically based acute infarction. Stable and largely normal for age gray-white matter differentiation is within normal limits throughout the brain. No cortical encephalomalacia. Vascular: Calcified atherosclerosis at the skull base. No suspicious intracranial vascular hyperdensity. Skull: No acute osseous abnormality identified. Sinuses/Orbits: Areas of chronic mucoperiosteal thickening. Overall improved sinus aeration compared to 2016. Tympanic cavities and mastoids remain clear. Other: Broad-based right frontal scalp hematoma up to 8 millimeters in thickness. The underlying right frontal bone and frontal sinus appear stable and intact. The other scalp soft tissues are negative. Stable orbits soft tissues. CT CERVICAL SPINE FINDINGS Alignment: Stable dextroconvex cervical spine scoliosis with straightening of lordosis. Cervicothoracic junction alignment is within normal limits. Bilateral posterior element alignment is within normal limits. Skull base and vertebrae: Visualized skull base is intact. No atlanto-occipital dissociation. 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 No acute cervical spine fracture identified. Soft tissues and spinal canal: No prevertebral fluid or swelling. No  visible canal hematoma. Disc levels: Advanced degeneration throughout the cervical spine, including multilevel severe left side facet arthropathy, appears stable since 2016. Upper chest: Visible upper thoracic levels appear intact. Stable lung apices. IMPRESSION: 1. Right forehead scalp hematoma without underlying fracture. 2. No other acute traumatic injury identified in the head or cervical spine. 3. Advanced chronic cervical spine degeneration in the setting of dextroconvex scoliosis and incompletely segmented C2-C3 levels. 4.  Stable non contrast CT appearance of the brain. Electronically Signed   By: Odessa Fleming M.D.   On: 09/18/2017 13:02   Ct Cervical Spine Wo Contrast  Result Date: 09/18/2017 CLINICAL DATA:  84 year old male status post unwitnessed fall 6 hours ago. Forehead bruising. EXAM: CT HEAD WITHOUT CONTRAST CT CERVICAL SPINE WITHOUT CONTRAST TECHNIQUE: Multidetector CT imaging of the head and cervical spine was performed following the standard protocol without intravenous contrast. Multiplanar CT image reconstructions of the cervical spine were also generated. COMPARISON:  Head CT without contrast 03/22/2015. Head and cervical spine CT 05/23/2014. FINDINGS: CT HEAD FINDINGS Brain: Mild additional generalized cerebral volume loss since 2016. No midline shift, ventriculomegaly, mass effect, evidence of mass lesion, intracranial hemorrhage or evidence of cortically based acute infarction. Stable and largely normal for age gray-white matter differentiation is within normal limits throughout the brain. No cortical encephalomalacia. Vascular: Calcified atherosclerosis at the skull base. No suspicious intracranial vascular hyperdensity. Skull: No acute osseous abnormality identified. Sinuses/Orbits: Areas of chronic mucoperiosteal thickening. Overall improved sinus aeration compared to 2016. Tympanic cavities and mastoids remain clear. Other: Broad-based right frontal scalp hematoma up to 8 millimeters in  thickness. The underlying right frontal bone and frontal sinus appear stable and intact. The other scalp soft tissues are negative. Stable orbits soft tissues. CT CERVICAL SPINE FINDINGS Alignment: Stable dextroconvex cervical spine scoliosis with straightening of lordosis. Cervicothoracic junction alignment is within normal limits. Bilateral posterior  element alignment is within normal limits. Skull base and vertebrae: Visualized skull base is intact. No atlanto-occipital dissociation. 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 No acute cervical spine fracture identified. Soft tissues and spinal canal: No prevertebral fluid or swelling. No visible canal hematoma. Disc levels: Advanced degeneration throughout the cervical spine, including multilevel severe left side facet arthropathy, appears stable since 2016. Upper chest: Visible upper thoracic levels appear intact. Stable lung apices. IMPRESSION: 1. Right forehead scalp hematoma without underlying fracture. 2. No other acute traumatic injury identified in the head or cervical spine. 3. Advanced chronic cervical spine degeneration in the setting of dextroconvex scoliosis and incompletely segmented C2-C3 levels. 4.  Stable non contrast CT appearance of the brain. Electronically Signed   By: Odessa FlemingH  Hall M.D.   On: 09/18/2017 13:02    Procedures Procedures (including critical care time)  Medications Ordered in ED Medications - No data to display   Initial Impression / Assessment and Plan / ED Course  I have reviewed the triage vital signs and the nursing notes.  Pertinent labs & imaging results that were available during my care of the patient were reviewed by me and considered in my medical decision making (see chart for details).    84 year old male with history of dementia and frequent falls is brought to the ER for evaluation after unwitnessed fall.  History is limited due to underlying dementia although he is pretty alert and  oriented.  Exam as above remarkable for moderate right forehead hematoma but otherwise no signs of significant facial, head, neck, chest, abdominal, pelvis or extremity injury.  He is at his baseline per PTAR.  He denies any other associated symptoms.  Given age, unwitnessed fall and unclear details leading up to the fall basic screening labs, troponin, EKG obtained which were WNL.  VSS.  CT head/cervical spine negative.  Do not think further emergent work-up or imaging is indicated at this time.  No indication for admission.  He is considered safe for discharge.  Final Clinical Impressions(s) / ED Diagnoses   Final diagnoses:  Fall, initial encounter  Traumatic hematoma of forehead, initial encounter    ED Discharge Orders    None       Liberty HandyGibbons, Breon Diss J, PA-C 09/18/17 1627    Vanetta MuldersZackowski, Scott, MD 09/20/17 [id number]

## 2017-09-18 NOTE — ED Notes (Signed)
Pt unable to sign for discharge instructions. Papers will be given to PTAR. Pt understands his discharge instructions

## 2017-09-18 NOTE — Telephone Encounter (Signed)
RN call wife that per Shanda BumpsJessica NP pt does not need to be seen at this time for memory. RN stated the rehab or nursing home medical team can send a referral or make an appt when pt is better. The wife verbalized understand. Rn also stated that that when pt falls the PCP or nursing home facility need to be notified. The wife verbalized understanding, and did not tell the nursing home to call us.

## 2017-09-18 NOTE — ED Triage Notes (Signed)
Pt had unwitnessed fall aprox 6 hours ago per PTAR. Pt does not recall falling, hx of dementia and multiple falls. Pt states he did not hit his head today. However, pt has large hematoma to right forehead with bruising. Pt reportedly fell 2 days ago as well. Pt at his mental baseline per PTAR. Pt takes  Plavix. BP 122/80, HR 84, 18 resp, 97% on room air. Pt is alert.

## 2017-09-18 NOTE — Discharge Instructions (Addendum)
You were seen in the ER for fall.   Work up today revealed a hematoma to your forehead. You can apply ice to this area, can take acetaminophen as needed for pain.   Return for headache, behavior changes, confusion, vision changes, vomiting, seizure activity, drooping or weakness to one side of body, numbness

## 2017-09-18 NOTE — ED Provider Notes (Signed)
Medical screening examination/treatment/procedure(s) were conducted as a shared visit with non-physician practitioner(s) and myself.  I personally evaluated the patient during the encounter.  EKG Interpretation  Date/Time:  Wednesday September 18 2017 11:36:42 EDT Ventricular Rate:  79 PR Interval:    QRS Duration: 134 QT Interval:  399 QTC Calculation: 458 R Axis:   -47 Text Interpretation:  Atrial fibrillation IVCD, consider atypical RBBB Inferior infarct, old Lateral leads are also involved Baseline wander in lead(s) V2 Interpretation limited secondary to artifact Confirmed by Vanetta Mulders [phone]) on 09/18/2017 11:39:49 AM Also confirmed by Vanetta Mulders [phone]), editor Barbette Hair [phone])  on 09/18/2017 12:07:19 PM   Results for orders placed or performed during the hospital encounter of 09/18/17  CBC with Differential  Result Value Ref Range   WBC 5.4 4.0 - 10.5 K/uL   RBC 3.61 (L) 4.22 - 5.81 MIL/uL   Hemoglobin 11.0 (L) 13.0 - 17.0 g/dL   HCT 21.3 (L) 08.6 - 57.8 %   MCV 97.5 78.0 - 100.0 fL   MCH 30.5 26.0 - 34.0 pg   MCHC 31.3 30.0 - 36.0 g/dL   RDW 46.9 (H) 62.9 - 52.8 %   Platelets 154 150 - 400 K/uL   Neutrophils Relative % 68 %   Neutro Abs 3.7 1.7 - 7.7 K/uL   Lymphocytes Relative 18 %   Lymphs Abs 1.0 0.7 - 4.0 K/uL   Monocytes Relative 10 %   Monocytes Absolute 0.5 0.1 - 1.0 K/uL   Eosinophils Relative 3 %   Eosinophils Absolute 0.2 0.0 - 0.7 K/uL   Basophils Relative 1 %   Basophils Absolute 0.0 0.0 - 0.1 K/uL   Immature Granulocytes 0 %   Abs Immature Granulocytes 0.0 0.0 - 0.1 K/uL  Basic metabolic panel  Result Value Ref Range   Sodium 140 135 - 145 mmol/L   Potassium 3.6 3.5 - 5.1 mmol/L   Chloride 107 98 - 111 mmol/L   CO2 26 22 - 32 mmol/L   Glucose, Bld 91 70 - 99 mg/dL   BUN 12 8 - 23 mg/dL   Creatinine, Ser 4.13 (H) 0.61 - 1.24 mg/dL   Calcium 8.7 (L) 8.9 - 10.3 mg/dL   GFR calc non Af Amer 51 (L) >60 mL/min   GFR calc Af Amer 60 (L) >60  mL/min   Anion gap 7 5 - 15  I-Stat Troponin, ED (not at [facility])  Result Value Ref Range   Troponin i, poc 0.01 0.00 - 0.08 ng/mL   Comment 3           Ct Head Wo Contrast  Result Date: 09/18/2017 CLINICAL DATA:  84 year old male status post unwitnessed fall 6 hours ago. Forehead bruising. EXAM: CT HEAD WITHOUT CONTRAST CT CERVICAL SPINE WITHOUT CONTRAST TECHNIQUE: Multidetector CT imaging of the head and cervical spine was performed following the standard protocol without intravenous contrast. Multiplanar CT image reconstructions of the cervical spine were also generated. COMPARISON:  Head CT without contrast 03/22/2015. Head and cervical spine CT 05/23/2014. FINDINGS: CT HEAD FINDINGS Brain: Mild additional generalized cerebral volume loss since 2016. No midline shift, ventriculomegaly, mass effect, evidence of mass lesion, intracranial hemorrhage or evidence of cortically based acute infarction. Stable and largely normal for age gray-white matter differentiation is within normal limits throughout the brain. No cortical encephalomalacia. Vascular: Calcified atherosclerosis at the skull base. No suspicious intracranial vascular hyperdensity. Skull: No acute osseous abnormality identified. Sinuses/Orbits: Areas of chronic mucoperiosteal thickening. Overall improved sinus aeration compared to  2016. Tympanic cavities and mastoids remain clear. Other: Broad-based right frontal scalp hematoma up to 8 millimeters in thickness. The underlying right frontal bone and frontal sinus appear stable and intact. The other scalp soft tissues are negative. Stable orbits soft tissues. CT CERVICAL SPINE FINDINGS Alignment: Stable dextroconvex cervical spine scoliosis with straightening of lordosis. Cervicothoracic junction alignment is within normal limits. Bilateral posterior element alignment is within normal limits. Skull base and vertebrae: Visualized skull base is intact. No atlanto-occipital dissociation. 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 No acute cervical spine fracture identified. Soft tissues and spinal canal: No prevertebral fluid or swelling. No visible canal hematoma. Disc levels: Advanced degeneration throughout the cervical spine, including multilevel severe left side facet arthropathy, appears stable since 2016. Upper chest: Visible upper thoracic levels appear intact. Stable lung apices. IMPRESSION: 1. Right forehead scalp hematoma without underlying fracture. 2. No other acute traumatic injury identified in the head or cervical spine. 3. Advanced chronic cervical spine degeneration in the setting of dextroconvex scoliosis and incompletely segmented C2-C3 levels. 4.  Stable non contrast CT appearance of the brain. Electronically Signed   By: Odessa FlemingH  Hall M.D.   On: 09/18/2017 13:02   Ct Cervical Spine Wo Contrast  Result Date: 09/18/2017 CLINICAL DATA:  84 year old male status post unwitnessed fall 6 hours ago. Forehead bruising. EXAM: CT HEAD WITHOUT CONTRAST CT CERVICAL SPINE WITHOUT CONTRAST TECHNIQUE: Multidetector CT imaging of the head and cervical spine was performed following the standard protocol without intravenous contrast. Multiplanar CT image reconstructions of the cervical spine were also generated. COMPARISON:  Head CT without contrast 03/22/2015. Head and cervical spine CT 05/23/2014. FINDINGS: CT HEAD FINDINGS Brain: Mild additional generalized cerebral volume loss since 2016. No midline shift, ventriculomegaly, mass effect, evidence of mass lesion, intracranial hemorrhage or evidence of cortically based acute infarction. Stable and largely normal for age gray-white matter differentiation is within normal limits throughout the brain. No cortical encephalomalacia. Vascular: Calcified atherosclerosis at the skull base. No suspicious intracranial vascular hyperdensity. Skull: No acute osseous abnormality identified. Sinuses/Orbits: Areas of chronic mucoperiosteal  thickening. Overall improved sinus aeration compared to 2016. Tympanic cavities and mastoids remain clear. Other: Broad-based right frontal scalp hematoma up to 8 millimeters in thickness. The underlying right frontal bone and frontal sinus appear stable and intact. The other scalp soft tissues are negative. Stable orbits soft tissues. CT CERVICAL SPINE FINDINGS Alignment: Stable dextroconvex cervical spine scoliosis with straightening of lordosis. Cervicothoracic junction alignment is within normal limits. Bilateral posterior element alignment is within normal limits. Skull base and vertebrae: Visualized skull base is intact. No atlanto-occipital dissociation. 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 No acute cervical spine fracture identified. Soft tissues and spinal canal: No prevertebral fluid or swelling. No visible canal hematoma. Disc levels: Advanced degeneration throughout the cervical spine, including multilevel severe left side facet arthropathy, appears stable since 2016. Upper chest: Visible upper thoracic levels appear intact. Stable lung apices. IMPRESSION: 1. Right forehead scalp hematoma without underlying fracture. 2. No other acute traumatic injury identified in the head or cervical spine. 3. Advanced chronic cervical spine degeneration in the setting of dextroconvex scoliosis and incompletely segmented C2-C3 levels. 4.  Stable non contrast CT appearance of the brain. Electronically Signed   By: Odessa FlemingH  Hall M.D.   On: 09/18/2017 13:02   Patient seen by me along with physician assistant.  Patient with an unwitnessed fall approximately 6 hours prior to arrival to the emergency department.  Patient has  a history of dementia and a history of frequent falls.  Patient denied hitting his head but he clearly has a large hematoma to his right forehead area probably measuring about 6 cm.  Patient's family member states that his mental status otherwise is baseline.  CT head neck  without any acute findings.  Patient is on Plavix but not on any other significant blood thinner.  Patient alert but confused.  Patient stable for discharge home.     Vanetta MuldersZackowski, Manika Hast, MD 09/18/17 1336

## 2017-09-18 NOTE — ED Notes (Signed)
[facility] arrived to transport pt. 

## 2017-09-18 NOTE — ED Notes (Signed)
Pt transported to CT at this time.

## 2017-09-18 NOTE — Telephone Encounter (Signed)
[name] [phone] called to advise [facility]. She does not know why, she handles transportation

## 2017-09-19 DIAGNOSIS — J449 Chronic obstructive pulmonary disease, unspecified: Secondary | ICD-10-CM | POA: Diagnosis not present

## 2017-09-19 DIAGNOSIS — R2689 Other abnormalities of gait and mobility: Secondary | ICD-10-CM | POA: Diagnosis not present

## 2017-09-19 DIAGNOSIS — Z9181 History of falling: Secondary | ICD-10-CM | POA: Diagnosis not present

## 2017-09-19 DIAGNOSIS — N186 End stage renal disease: Secondary | ICD-10-CM | POA: Diagnosis not present

## 2017-09-19 DIAGNOSIS — D696 Thrombocytopenia, unspecified: Secondary | ICD-10-CM | POA: Diagnosis not present

## 2017-09-19 DIAGNOSIS — F0391 Unspecified dementia with behavioral disturbance: Secondary | ICD-10-CM | POA: Diagnosis not present

## 2017-09-19 DIAGNOSIS — I4891 Unspecified atrial fibrillation: Secondary | ICD-10-CM | POA: Diagnosis not present

## 2017-09-19 DIAGNOSIS — K219 Gastro-esophageal reflux disease without esophagitis: Secondary | ICD-10-CM | POA: Diagnosis not present

## 2017-09-19 DIAGNOSIS — R2681 Unsteadiness on feet: Secondary | ICD-10-CM | POA: Diagnosis not present

## 2017-09-19 DIAGNOSIS — I2510 Atherosclerotic heart disease of native coronary artery without angina pectoris: Secondary | ICD-10-CM | POA: Diagnosis not present

## 2017-09-19 DIAGNOSIS — R1312 Dysphagia, oropharyngeal phase: Secondary | ICD-10-CM | POA: Diagnosis not present

## 2017-09-19 DIAGNOSIS — R296 Repeated falls: Secondary | ICD-10-CM | POA: Diagnosis not present

## 2017-09-19 DIAGNOSIS — I509 Heart failure, unspecified: Secondary | ICD-10-CM | POA: Diagnosis not present

## 2017-09-19 DIAGNOSIS — R262 Difficulty in walking, not elsewhere classified: Secondary | ICD-10-CM | POA: Diagnosis not present

## 2017-09-20 DIAGNOSIS — I2510 Atherosclerotic heart disease of native coronary artery without angina pectoris: Secondary | ICD-10-CM | POA: Diagnosis not present

## 2017-09-20 DIAGNOSIS — R296 Repeated falls: Secondary | ICD-10-CM | POA: Diagnosis not present

## 2017-09-20 DIAGNOSIS — R2681 Unsteadiness on feet: Secondary | ICD-10-CM | POA: Diagnosis not present

## 2017-09-20 DIAGNOSIS — F0391 Unspecified dementia with behavioral disturbance: Secondary | ICD-10-CM | POA: Diagnosis not present

## 2017-09-20 DIAGNOSIS — K219 Gastro-esophageal reflux disease without esophagitis: Secondary | ICD-10-CM | POA: Diagnosis not present

## 2017-09-20 DIAGNOSIS — R1312 Dysphagia, oropharyngeal phase: Secondary | ICD-10-CM | POA: Diagnosis not present

## 2017-09-20 DIAGNOSIS — R262 Difficulty in walking, not elsewhere classified: Secondary | ICD-10-CM | POA: Diagnosis not present

## 2017-09-23 DIAGNOSIS — K219 Gastro-esophageal reflux disease without esophagitis: Secondary | ICD-10-CM | POA: Diagnosis not present

## 2017-09-23 DIAGNOSIS — I2510 Atherosclerotic heart disease of native coronary artery without angina pectoris: Secondary | ICD-10-CM | POA: Diagnosis not present

## 2017-09-23 DIAGNOSIS — R296 Repeated falls: Secondary | ICD-10-CM | POA: Diagnosis not present

## 2017-09-23 DIAGNOSIS — R2681 Unsteadiness on feet: Secondary | ICD-10-CM | POA: Diagnosis not present

## 2017-09-23 DIAGNOSIS — R262 Difficulty in walking, not elsewhere classified: Secondary | ICD-10-CM | POA: Diagnosis not present

## 2017-09-23 DIAGNOSIS — F0391 Unspecified dementia with behavioral disturbance: Secondary | ICD-10-CM | POA: Diagnosis not present

## 2017-09-23 DIAGNOSIS — R1312 Dysphagia, oropharyngeal phase: Secondary | ICD-10-CM | POA: Diagnosis not present

## 2017-09-24 DIAGNOSIS — R296 Repeated falls: Secondary | ICD-10-CM | POA: Diagnosis not present

## 2017-09-24 DIAGNOSIS — R2681 Unsteadiness on feet: Secondary | ICD-10-CM | POA: Diagnosis not present

## 2017-09-24 DIAGNOSIS — I2510 Atherosclerotic heart disease of native coronary artery without angina pectoris: Secondary | ICD-10-CM | POA: Diagnosis not present

## 2017-09-24 DIAGNOSIS — K219 Gastro-esophageal reflux disease without esophagitis: Secondary | ICD-10-CM | POA: Diagnosis not present

## 2017-09-24 DIAGNOSIS — R262 Difficulty in walking, not elsewhere classified: Secondary | ICD-10-CM | POA: Diagnosis not present

## 2017-09-24 DIAGNOSIS — R1312 Dysphagia, oropharyngeal phase: Secondary | ICD-10-CM | POA: Diagnosis not present

## 2017-09-24 DIAGNOSIS — F0391 Unspecified dementia with behavioral disturbance: Secondary | ICD-10-CM | POA: Diagnosis not present

## 2017-09-25 DIAGNOSIS — R262 Difficulty in walking, not elsewhere classified: Secondary | ICD-10-CM | POA: Diagnosis not present

## 2017-09-25 DIAGNOSIS — R2681 Unsteadiness on feet: Secondary | ICD-10-CM | POA: Diagnosis not present

## 2017-09-25 DIAGNOSIS — K219 Gastro-esophageal reflux disease without esophagitis: Secondary | ICD-10-CM | POA: Diagnosis not present

## 2017-09-25 DIAGNOSIS — I2510 Atherosclerotic heart disease of native coronary artery without angina pectoris: Secondary | ICD-10-CM | POA: Diagnosis not present

## 2017-09-25 DIAGNOSIS — R296 Repeated falls: Secondary | ICD-10-CM | POA: Diagnosis not present

## 2017-09-25 DIAGNOSIS — F0391 Unspecified dementia with behavioral disturbance: Secondary | ICD-10-CM | POA: Diagnosis not present

## 2017-09-25 DIAGNOSIS — R1312 Dysphagia, oropharyngeal phase: Secondary | ICD-10-CM | POA: Diagnosis not present

## 2017-09-26 DIAGNOSIS — I2510 Atherosclerotic heart disease of native coronary artery without angina pectoris: Secondary | ICD-10-CM | POA: Diagnosis not present

## 2017-09-26 DIAGNOSIS — R2689 Other abnormalities of gait and mobility: Secondary | ICD-10-CM | POA: Diagnosis not present

## 2017-09-26 DIAGNOSIS — F411 Generalized anxiety disorder: Secondary | ICD-10-CM | POA: Diagnosis not present

## 2017-09-26 DIAGNOSIS — F0391 Unspecified dementia with behavioral disturbance: Secondary | ICD-10-CM | POA: Diagnosis not present

## 2017-09-26 DIAGNOSIS — R2681 Unsteadiness on feet: Secondary | ICD-10-CM | POA: Diagnosis not present

## 2017-09-26 DIAGNOSIS — R262 Difficulty in walking, not elsewhere classified: Secondary | ICD-10-CM | POA: Diagnosis not present

## 2017-09-26 DIAGNOSIS — R296 Repeated falls: Secondary | ICD-10-CM | POA: Diagnosis not present

## 2017-09-26 DIAGNOSIS — I509 Heart failure, unspecified: Secondary | ICD-10-CM | POA: Diagnosis not present

## 2017-09-26 DIAGNOSIS — N183 Chronic kidney disease, stage 3 (moderate): Secondary | ICD-10-CM | POA: Diagnosis not present

## 2017-09-26 DIAGNOSIS — J449 Chronic obstructive pulmonary disease, unspecified: Secondary | ICD-10-CM | POA: Diagnosis not present

## 2017-09-26 DIAGNOSIS — R1312 Dysphagia, oropharyngeal phase: Secondary | ICD-10-CM | POA: Diagnosis not present

## 2017-09-26 DIAGNOSIS — G4700 Insomnia, unspecified: Secondary | ICD-10-CM | POA: Diagnosis not present

## 2017-09-26 DIAGNOSIS — I4891 Unspecified atrial fibrillation: Secondary | ICD-10-CM | POA: Diagnosis not present

## 2017-09-26 DIAGNOSIS — K219 Gastro-esophageal reflux disease without esophagitis: Secondary | ICD-10-CM | POA: Diagnosis not present

## 2017-09-27 DIAGNOSIS — I2510 Atherosclerotic heart disease of native coronary artery without angina pectoris: Secondary | ICD-10-CM | POA: Diagnosis not present

## 2017-09-27 DIAGNOSIS — R2681 Unsteadiness on feet: Secondary | ICD-10-CM | POA: Diagnosis not present

## 2017-09-27 DIAGNOSIS — R296 Repeated falls: Secondary | ICD-10-CM | POA: Diagnosis not present

## 2017-09-27 DIAGNOSIS — F0391 Unspecified dementia with behavioral disturbance: Secondary | ICD-10-CM | POA: Diagnosis not present

## 2017-09-27 DIAGNOSIS — K219 Gastro-esophageal reflux disease without esophagitis: Secondary | ICD-10-CM | POA: Diagnosis not present

## 2017-09-27 DIAGNOSIS — R262 Difficulty in walking, not elsewhere classified: Secondary | ICD-10-CM | POA: Diagnosis not present

## 2017-09-27 DIAGNOSIS — R1312 Dysphagia, oropharyngeal phase: Secondary | ICD-10-CM | POA: Diagnosis not present

## 2017-09-28 DIAGNOSIS — R296 Repeated falls: Secondary | ICD-10-CM | POA: Diagnosis not present

## 2017-09-28 DIAGNOSIS — R262 Difficulty in walking, not elsewhere classified: Secondary | ICD-10-CM | POA: Diagnosis not present

## 2017-09-28 DIAGNOSIS — R1312 Dysphagia, oropharyngeal phase: Secondary | ICD-10-CM | POA: Diagnosis not present

## 2017-09-28 DIAGNOSIS — F0391 Unspecified dementia with behavioral disturbance: Secondary | ICD-10-CM | POA: Diagnosis not present

## 2017-09-28 DIAGNOSIS — I2510 Atherosclerotic heart disease of native coronary artery without angina pectoris: Secondary | ICD-10-CM | POA: Diagnosis not present

## 2017-09-28 DIAGNOSIS — R2681 Unsteadiness on feet: Secondary | ICD-10-CM | POA: Diagnosis not present

## 2017-09-28 DIAGNOSIS — K219 Gastro-esophageal reflux disease without esophagitis: Secondary | ICD-10-CM | POA: Diagnosis not present

## 2017-09-30 DIAGNOSIS — K219 Gastro-esophageal reflux disease without esophagitis: Secondary | ICD-10-CM | POA: Diagnosis not present

## 2017-09-30 DIAGNOSIS — R1312 Dysphagia, oropharyngeal phase: Secondary | ICD-10-CM | POA: Diagnosis not present

## 2017-09-30 DIAGNOSIS — I2510 Atherosclerotic heart disease of native coronary artery without angina pectoris: Secondary | ICD-10-CM | POA: Diagnosis not present

## 2017-09-30 DIAGNOSIS — R262 Difficulty in walking, not elsewhere classified: Secondary | ICD-10-CM | POA: Diagnosis not present

## 2017-09-30 DIAGNOSIS — R2681 Unsteadiness on feet: Secondary | ICD-10-CM | POA: Diagnosis not present

## 2017-09-30 DIAGNOSIS — F0391 Unspecified dementia with behavioral disturbance: Secondary | ICD-10-CM | POA: Diagnosis not present

## 2017-09-30 DIAGNOSIS — M25552 Pain in left hip: Secondary | ICD-10-CM | POA: Diagnosis not present

## 2017-09-30 DIAGNOSIS — R296 Repeated falls: Secondary | ICD-10-CM | POA: Diagnosis not present

## 2017-10-01 DIAGNOSIS — K219 Gastro-esophageal reflux disease without esophagitis: Secondary | ICD-10-CM | POA: Diagnosis not present

## 2017-10-01 DIAGNOSIS — F0391 Unspecified dementia with behavioral disturbance: Secondary | ICD-10-CM | POA: Diagnosis not present

## 2017-10-01 DIAGNOSIS — I2510 Atherosclerotic heart disease of native coronary artery without angina pectoris: Secondary | ICD-10-CM | POA: Diagnosis not present

## 2017-10-01 DIAGNOSIS — R2681 Unsteadiness on feet: Secondary | ICD-10-CM | POA: Diagnosis not present

## 2017-10-01 DIAGNOSIS — R1312 Dysphagia, oropharyngeal phase: Secondary | ICD-10-CM | POA: Diagnosis not present

## 2017-10-01 DIAGNOSIS — R262 Difficulty in walking, not elsewhere classified: Secondary | ICD-10-CM | POA: Diagnosis not present

## 2017-10-01 DIAGNOSIS — R296 Repeated falls: Secondary | ICD-10-CM | POA: Diagnosis not present

## 2017-10-02 DIAGNOSIS — I2510 Atherosclerotic heart disease of native coronary artery without angina pectoris: Secondary | ICD-10-CM | POA: Diagnosis not present

## 2017-10-02 DIAGNOSIS — R1312 Dysphagia, oropharyngeal phase: Secondary | ICD-10-CM | POA: Diagnosis not present

## 2017-10-02 DIAGNOSIS — R2681 Unsteadiness on feet: Secondary | ICD-10-CM | POA: Diagnosis not present

## 2017-10-02 DIAGNOSIS — R296 Repeated falls: Secondary | ICD-10-CM | POA: Diagnosis not present

## 2017-10-02 DIAGNOSIS — K219 Gastro-esophageal reflux disease without esophagitis: Secondary | ICD-10-CM | POA: Diagnosis not present

## 2017-10-02 DIAGNOSIS — F0391 Unspecified dementia with behavioral disturbance: Secondary | ICD-10-CM | POA: Diagnosis not present

## 2017-10-02 DIAGNOSIS — R262 Difficulty in walking, not elsewhere classified: Secondary | ICD-10-CM | POA: Diagnosis not present

## 2017-10-03 DIAGNOSIS — F0391 Unspecified dementia with behavioral disturbance: Secondary | ICD-10-CM | POA: Diagnosis not present

## 2017-10-03 DIAGNOSIS — R296 Repeated falls: Secondary | ICD-10-CM | POA: Diagnosis not present

## 2017-10-03 DIAGNOSIS — R262 Difficulty in walking, not elsewhere classified: Secondary | ICD-10-CM | POA: Diagnosis not present

## 2017-10-03 DIAGNOSIS — R2681 Unsteadiness on feet: Secondary | ICD-10-CM | POA: Diagnosis not present

## 2017-10-03 DIAGNOSIS — R1312 Dysphagia, oropharyngeal phase: Secondary | ICD-10-CM | POA: Diagnosis not present

## 2017-10-03 DIAGNOSIS — K219 Gastro-esophageal reflux disease without esophagitis: Secondary | ICD-10-CM | POA: Diagnosis not present

## 2017-10-03 DIAGNOSIS — R2689 Other abnormalities of gait and mobility: Secondary | ICD-10-CM | POA: Diagnosis not present

## 2017-10-03 DIAGNOSIS — N400 Enlarged prostate without lower urinary tract symptoms: Secondary | ICD-10-CM | POA: Diagnosis not present

## 2017-10-03 DIAGNOSIS — I2510 Atherosclerotic heart disease of native coronary artery without angina pectoris: Secondary | ICD-10-CM | POA: Diagnosis not present

## 2017-10-04 DIAGNOSIS — R1312 Dysphagia, oropharyngeal phase: Secondary | ICD-10-CM | POA: Diagnosis not present

## 2017-10-04 DIAGNOSIS — F0391 Unspecified dementia with behavioral disturbance: Secondary | ICD-10-CM | POA: Diagnosis not present

## 2017-10-04 DIAGNOSIS — R262 Difficulty in walking, not elsewhere classified: Secondary | ICD-10-CM | POA: Diagnosis not present

## 2017-10-04 DIAGNOSIS — R296 Repeated falls: Secondary | ICD-10-CM | POA: Diagnosis not present

## 2017-10-04 DIAGNOSIS — K219 Gastro-esophageal reflux disease without esophagitis: Secondary | ICD-10-CM | POA: Diagnosis not present

## 2017-10-04 DIAGNOSIS — R2681 Unsteadiness on feet: Secondary | ICD-10-CM | POA: Diagnosis not present

## 2017-10-04 DIAGNOSIS — I2510 Atherosclerotic heart disease of native coronary artery without angina pectoris: Secondary | ICD-10-CM | POA: Diagnosis not present

## 2017-10-07 DIAGNOSIS — R1312 Dysphagia, oropharyngeal phase: Secondary | ICD-10-CM | POA: Diagnosis not present

## 2017-10-07 DIAGNOSIS — R262 Difficulty in walking, not elsewhere classified: Secondary | ICD-10-CM | POA: Diagnosis not present

## 2017-10-07 DIAGNOSIS — I2510 Atherosclerotic heart disease of native coronary artery without angina pectoris: Secondary | ICD-10-CM | POA: Diagnosis not present

## 2017-10-07 DIAGNOSIS — R2681 Unsteadiness on feet: Secondary | ICD-10-CM | POA: Diagnosis not present

## 2017-10-07 DIAGNOSIS — F0391 Unspecified dementia with behavioral disturbance: Secondary | ICD-10-CM | POA: Diagnosis not present

## 2017-10-07 DIAGNOSIS — R296 Repeated falls: Secondary | ICD-10-CM | POA: Diagnosis not present

## 2017-10-07 DIAGNOSIS — K219 Gastro-esophageal reflux disease without esophagitis: Secondary | ICD-10-CM | POA: Diagnosis not present

## 2017-10-08 ENCOUNTER — Other Ambulatory Visit: Payer: Self-pay

## 2017-10-08 ENCOUNTER — Emergency Department (HOSPITAL_COMMUNITY): Payer: Medicare HMO

## 2017-10-08 ENCOUNTER — Encounter (HOSPITAL_COMMUNITY): Payer: Self-pay

## 2017-10-08 ENCOUNTER — Emergency Department (HOSPITAL_COMMUNITY)
Admission: EM | Admit: 2017-10-08 | Discharge: 2017-10-09 | Disposition: A | Discharge status: Home / Self Care | Payer: Medicare HMO | Attending: Emergency Medicine | Admitting: Emergency Medicine

## 2017-10-08 DIAGNOSIS — R296 Repeated falls: Secondary | ICD-10-CM | POA: Diagnosis not present

## 2017-10-08 DIAGNOSIS — Y939 Activity, unspecified: Secondary | ICD-10-CM | POA: Insufficient documentation

## 2017-10-08 DIAGNOSIS — G309 Alzheimer's disease, unspecified: Secondary | ICD-10-CM | POA: Diagnosis not present

## 2017-10-08 DIAGNOSIS — R410 Disorientation, unspecified: Secondary | ICD-10-CM | POA: Diagnosis not present

## 2017-10-08 DIAGNOSIS — Y999 Unspecified external cause status: Secondary | ICD-10-CM | POA: Diagnosis not present

## 2017-10-08 DIAGNOSIS — D649 Anemia, unspecified: Secondary | ICD-10-CM | POA: Insufficient documentation

## 2017-10-08 DIAGNOSIS — Z23 Encounter for immunization: Secondary | ICD-10-CM | POA: Insufficient documentation

## 2017-10-08 DIAGNOSIS — S0990XA Unspecified injury of head, initial encounter: Secondary | ICD-10-CM | POA: Diagnosis not present

## 2017-10-08 DIAGNOSIS — S0181XA Laceration without foreign body of other part of head, initial encounter: Secondary | ICD-10-CM | POA: Diagnosis not present

## 2017-10-08 DIAGNOSIS — Z87891 Personal history of nicotine dependence: Secondary | ICD-10-CM | POA: Insufficient documentation

## 2017-10-08 DIAGNOSIS — Z7902 Long term (current) use of antithrombotics/antiplatelets: Secondary | ICD-10-CM | POA: Insufficient documentation

## 2017-10-08 DIAGNOSIS — F0280 Dementia in other diseases classified elsewhere without behavioral disturbance: Secondary | ICD-10-CM | POA: Diagnosis not present

## 2017-10-08 DIAGNOSIS — K219 Gastro-esophageal reflux disease without esophagitis: Secondary | ICD-10-CM | POA: Diagnosis not present

## 2017-10-08 DIAGNOSIS — S79912A Unspecified injury of left hip, initial encounter: Secondary | ICD-10-CM | POA: Diagnosis not present

## 2017-10-08 DIAGNOSIS — Y92009 Unspecified place in unspecified non-institutional (private) residence as the place of occurrence of the external cause: Secondary | ICD-10-CM

## 2017-10-08 DIAGNOSIS — R262 Difficulty in walking, not elsewhere classified: Secondary | ICD-10-CM | POA: Diagnosis not present

## 2017-10-08 DIAGNOSIS — S199XXA Unspecified injury of neck, initial encounter: Secondary | ICD-10-CM | POA: Diagnosis not present

## 2017-10-08 DIAGNOSIS — W19XXXA Unspecified fall, initial encounter: Secondary | ICD-10-CM | POA: Insufficient documentation

## 2017-10-08 DIAGNOSIS — R2681 Unsteadiness on feet: Secondary | ICD-10-CM | POA: Diagnosis not present

## 2017-10-08 DIAGNOSIS — F0391 Unspecified dementia with behavioral disturbance: Secondary | ICD-10-CM | POA: Diagnosis not present

## 2017-10-08 DIAGNOSIS — I10 Essential (primary) hypertension: Secondary | ICD-10-CM | POA: Insufficient documentation

## 2017-10-08 DIAGNOSIS — Y92129 Unspecified place in nursing home as the place of occurrence of the external cause: Secondary | ICD-10-CM | POA: Insufficient documentation

## 2017-10-08 DIAGNOSIS — S299XXA Unspecified injury of thorax, initial encounter: Secondary | ICD-10-CM | POA: Diagnosis not present

## 2017-10-08 DIAGNOSIS — I2510 Atherosclerotic heart disease of native coronary artery without angina pectoris: Secondary | ICD-10-CM | POA: Insufficient documentation

## 2017-10-08 DIAGNOSIS — R58 Hemorrhage, not elsewhere classified: Secondary | ICD-10-CM | POA: Diagnosis not present

## 2017-10-08 DIAGNOSIS — R1312 Dysphagia, oropharyngeal phase: Secondary | ICD-10-CM | POA: Diagnosis not present

## 2017-10-08 LAB — BASIC METABOLIC PANEL
ANION GAP: 7 (ref 5–15)
BUN: 14 mg/dL (ref 8–23)
CO2: 23 mmol/L (ref 22–32)
CREATININE: 1.24 mg/dL (ref 0.61–1.24)
Calcium: 8.3 mg/dL — ABNORMAL LOW (ref 8.9–10.3)
Chloride: 105 mmol/L (ref 98–111)
GFR calc Af Amer: >60 mL/min (ref >60)
GFR, EST NON AFRICAN AMERICAN: 52 mL/min — AB (ref >60)
GLUCOSE: 97 mg/dL (ref 70–99)
Potassium: 4 mmol/L (ref 3.5–5.1)
Sodium: 135 mmol/L (ref 135–145)

## 2017-10-08 LAB — CBC
HCT: 27.9 % — ABNORMAL LOW (ref 39.0–52.0)
HEMOGLOBIN: 8.8 g/dL — AB (ref 13.0–17.0)
MCH: 30.9 pg (ref 26.0–34.0)
MCHC: 31.5 g/dL (ref 30.0–36.0)
MCV: 97.9 fL (ref 78.0–100.0)
Platelets: 172 10*3/uL (ref 150–400)
RBC: 2.85 MIL/uL — ABNORMAL LOW (ref 4.22–5.81)
RDW: 16.2 % — ABNORMAL HIGH (ref 11.5–15.5)
WBC: 5.4 10*3/uL (ref 4.0–10.5)

## 2017-10-08 LAB — PROTIME-INR
INR: 1.25
PROTHROMBIN TIME: 15.6 s — AB (ref 11.4–15.2)

## 2017-10-08 MED ORDER — LIDOCAINE-EPINEPHRINE (PF) 2 %-1:200000 IJ SOLN
5.0000 mL | Freq: Once | INTRAMUSCULAR | Status: AC
  Administered 2017-10-08: 5 mL
  Filled 2017-10-08: qty 20

## 2017-10-08 MED ORDER — TETANUS-DIPHTH-ACELL PERTUSSIS 5-2.5-18.5 LF-MCG/0.5 IM SUSP
0.5000 mL | Freq: Once | INTRAMUSCULAR | Status: AC
  Administered 2017-10-08: 0.5 mL via INTRAMUSCULAR
  Filled 2017-10-08: qty 0.5

## 2017-10-08 NOTE — ED Notes (Signed)
Pt incontinent of urine; brief changed.

## 2017-10-08 NOTE — ED Notes (Signed)
Pt returned from xray

## 2017-10-08 NOTE — ED Notes (Signed)
Patient transported to X-ray 

## 2017-10-08 NOTE — ED Triage Notes (Addendum)
Per [facility], pt from [facility] after an unwitnessed fall, pt has 2 inch deep cut above right eye, bandage in place bleeding controlled. Pt is on plavix. Has hx of dementia, oriented x 3 which is baseline for patient. VSS.

## 2017-10-08 NOTE — ED Provider Notes (Signed)
[facility] EMERGENCY DEPARTMENT Provider Note   CSN: [id number] Arrival date & time: 10/08/17  1416     History   Chief Complaint Chief Complaint  Patient presents with  . Fall    HPI [name] is a 84 y.o. male.  The history is provided by the patient, the spouse and medical records. History limited by: Alzheimer's dementia.  Fall  This is a recurrent problem. The current episode started 1 to 2 hours ago. Episode frequency: once. The problem has not changed since onset.Pertinent negatives include no chest pain, no abdominal pain, no headaches and no shortness of breath. Nothing aggravates the symptoms. Nothing relieves the symptoms. He has tried nothing for the symptoms.    Past Medical History:  Diagnosis Date  . Allergic rhinitis   . Arthritis   . Constipation    takes Miralax daily as needed  . Coronary artery disease    s/p remote IWMI 2004 with PCI of the RCA with BMS and repeat cath 2007 due to recurrent CP and abnormal nuclear stress test and cath showed widely patent RCA stent with 30-50% distal stenosis before the takeoff of a PL and PDA and normal LVF.   [name] Depression with anxiety    takes Clonazepam nightly  . GERD (gastroesophageal reflux disease)    takes Omeprazole and Pepcid daily  . Heart attack (HCC)   . History of bronchitis    pulmonologist is Dr.Wert.  . Hyperlipidemia    takes Atorvastatin daily  . Hypertension    takes Diovan daily  . Joint pain   . Memory loss   . RLS (restless legs syndrome)   . Unspecified vitamin D deficiency   . Vitamin B 12 deficiency     Patient Active Problem List   Diagnosis Date Noted  . History of percutaneous coronary intervention 08/01/2017  . Shingles 08/01/2017  . Goals of care, counseling/discussion   . Palliative care by specialist   . Chest pain 07/25/2017  . Dementia 03/07/2015  . Fall (on) (from) other stairs and steps, sequela 03/07/2015  . S/P shoulder replacement 04/29/2014    . Essential hypertension 03/11/2014  . Mild cognitive impairment 03/12/2013  . Anxiety state 03/12/2013  . CELLULITIS AND ABSCESS OF UPPER ARM AND FOREARM 07/05/2009  . Dyslipidemia 03/21/2007  . Non-ST elevation (NSTEMI) myocardial infarction (HCC) 03/21/2007  . ALLERGIC RHINITIS 03/21/2007  . COUGH 01/14/2007  . CAD (coronary artery disease), native coronary artery 01/13/2007  . CHRONIC RHINITIS 01/13/2007  . COPD pfts pending  01/13/2007  . Esophageal reflux 01/13/2007  . DYSPNEA 01/13/2007    Past Surgical History:  Procedure Laterality Date  . CARDIAC CATHETERIZATION  2004  . cataract surgery    . COLONOSCOPY    . CORONARY ANGIOPLASTY     1 stent  . ESOPHAGOGASTRODUODENOSCOPY    . HAND SURGERY Bilateral   . HERNIA REPAIR Left    inguinal   . NASAL SINUS SURGERY    . REVERSE SHOULDER ARTHROPLASTY Right 04/29/2014   Procedure: REVERSE TOTAL SHOULDER ARTHROPLASTY ;  Surgeon: Francena Hanly, MD;  Location: [facility] OR;  Service: Orthopedics;  Laterality: Right;  . stents          Home Medications    Prior to Admission medications   Medication Sig Start Date End Date Taking? Authorizing Provider  ALPRAZolam (XANAX) 0.25 MG tablet Take 0.25 mg by mouth 2 (two) times daily. 07/03/17   [provider]  aspirin EC 81 MG EC tablet  Take 1 tablet (81 mg total) by mouth daily. 08/02/17   Filbert Schilder, NP  atorvastatin (LIPITOR) 20 MG tablet Take 1 tablet (20 mg total) by mouth daily at 6 PM. 08/01/17   Filbert Schilder, NP  carvedilol (COREG) 3.125 MG tablet Take 1 tablet (3.125 mg total) by mouth 2 (two) times daily with a meal. 08/01/17   Georgie Chard D, NP  chlorpheniramine (ALLERGY) 4 MG tablet Take 4 mg by mouth 2 (two) times daily as needed for allergies.    [provider]  clopidogrel (PLAVIX) 75 MG tablet Take 1 tablet (75 mg total) by mouth daily. 08/02/17   Filbert Schilder, NP  donepezil (ARICEPT) 10 MG tablet Take 1 tablet (10 mg total) by mouth at  bedtime. Start 1/2 tablet daily x 4 weeks then 1 tablet daily 02/19/17   Micki Riley, MD  doxepin (SINEQUAN) 10 MG capsule Take 10 mg by mouth at bedtime.    [provider]  fluticasone (FLONASE) 50 MCG/ACT nasal spray Place 1 spray into both nostrils 2 (two) times daily. Reported on 03/07/2015    [provider]  guaiFENesin (MUCINEX) 600 MG 12 hr tablet Take 600 mg by mouth 2 (two) times daily.    [provider]  memantine (NAMENDA TITRATION PAK) tablet pack 5 mg/day for =1 week; 5 mg twice daily for =1 week; 15 mg/day given in 5 mg and 10 mg separated doses for =1 week; then 10 mg twice daily 06/17/17   George Hugh, NP  Multiple Vitamin (MULTIVITAMIN WITH MINERALS) TABS tablet Take 1 tablet by mouth daily.    [provider]  nitroGLYCERIN (NITROSTAT) 0.4 MG SL tablet Place 1 tablet (0.4 mg total) under the tongue every 5 (five) minutes x 3 doses as needed for chest pain. 08/01/17   Filbert Schilder, NP  tiotropium (SPIRIVA) 18 MCG inhalation capsule Place 1 capsule (18 mcg total) into inhaler and inhale daily. Patient not taking: Reported on 07/25/2017 03/31/16   Lurene Shadow, PA-C  Tiotropium Bromide-Olodaterol (STIOLTO RESPIMAT) 2.5-2.5 MCG/ACT AERS Inhale 2 puffs into the lungs at bedtime.    [provider]    Family History Family History  Problem Relation Age of Onset  . Asthma Mother   . Cancer - Prostate Father     Social History Social History   Tobacco Use  . Smoking status: Former Smoker    Packs/day: 3.00    Years: 15.00    Pack years: 45.00    Types: Cigarettes    Last attempt to quit: 02/06/1964    Years since quitting: 53.7  . Smokeless tobacco: Never Used  Substance Use Topics  . Alcohol use: No    Alcohol/week: 0.0 standard drinks  . Drug use: No     Allergies   Patient has no known allergies.   Review of Systems Review of Systems  Constitutional: Negative for fever.  HENT: Negative for nosebleeds.    Eyes: Negative for pain.  Respiratory: Negative for shortness of breath.   Cardiovascular: Negative for chest pain.  Gastrointestinal: Negative for abdominal pain.  Genitourinary: Negative for flank pain.  Musculoskeletal: Negative for back pain.  Skin: Positive for wound.  Neurological: Negative for headaches.  Psychiatric/Behavioral: Positive for confusion.     Physical Exam Updated Vital Signs BP 132/77 (BP Location: Right Arm)   Pulse (!) 56   Temp 98.1 F (36.7 C) (Oral)   Resp 17   SpO2 100%   Physical Exam  Constitutional: He appears well-developed and well-nourished.  HENT:  Head: Normocephalic and atraumatic.  Eyes: Conjunctivae are normal.  Neck: Neck supple.  Cardiovascular: Normal rate and regular rhythm.  No murmur heard. Pulmonary/Chest: Effort normal and breath sounds normal. No respiratory distress.  Abdominal: Soft. There is no tenderness.  Musculoskeletal: He exhibits deformity (left hip). He exhibits no edema or tenderness.  Neurological: He is alert.  Oriented to person and year  Skin: Skin is warm and dry.  Wound over right eyebrow. Bruising over right chest wall. Bruising over left lateral leg.  Psychiatric: He has a normal mood and affect.  Nursing note and vitals reviewed.    ED Treatments / Results  Labs (all labs ordered are listed, but only abnormal results are displayed) Labs Reviewed  CBC - Abnormal; Notable for the following components:      Result Value   RBC 2.85 (*)    Hemoglobin 8.8 (*)    HCT 27.9 (*)    RDW 16.2 (*)    All other components within normal limits  PROTIME-INR - Abnormal; Notable for the following components:   Prothrombin Time 15.6 (*)    All other components within normal limits  BASIC METABOLIC PANEL - Abnormal; Notable for the following components:   Calcium 8.3 (*)    GFR calc non Af Amer 52 (*)    All other components within normal limits    EKG None  Radiology Dg Chest 1 View  Result Date:  10/08/2017 CLINICAL DATA:  Fall EXAM: CHEST  1 VIEW COMPARISON:  07/25/2017, 09/29/2015, 05/23/2014, CT chest 10/22/2013 FINDINGS: No acute opacity or pleural effusion. Mild cardiomegaly. Enlarged mediastinal silhouette with aortic atherosclerosis. No pneumothorax. IMPRESSION: 1. No radiographic evidence for acute cardiopulmonary abnormality. 2. Enlarged mediastinal silhouette, with history of ascending aortic aneurysm, suspect that this may be increased in size compared to prior CT from 2015. Follow-up CTA of the chest would better evaluate the aorta. Electronically Signed   By: Jasmine Pang M.D.   On: 10/08/2017 17:37   Ct Head Wo Contrast  Result Date: 10/08/2017 CLINICAL DATA:  Fall while on anti coagulation therapy. EXAM: CT HEAD WITHOUT CONTRAST CT CERVICAL SPINE WITHOUT CONTRAST TECHNIQUE: Multidetector CT imaging of the head and cervical spine was performed following the standard protocol without intravenous contrast. Multiplanar CT image reconstructions of the cervical spine were also generated. COMPARISON:  None. FINDINGS: CT HEAD FINDINGS Brain: There is no mass, hemorrhage or extra-axial collection. The size and configuration of the ventricles and extra-axial CSF spaces are normal. There is hypoattenuation of the periventricular white matter, most commonly indicating chronic ischemic microangiopathy. Vascular: No abnormal hyperdensity of the major intracranial arteries or dural venous sinuses. No intracranial atherosclerosis. Skull: Small right parietal scalp hematoma.  No skull fracture. Sinuses/Orbits: No fluid levels or advanced mucosal thickening of the visualized paranasal sinuses. No mastoid or middle ear effusion. The orbits are normal. CT CERVICAL SPINE FINDINGS Alignment: No static subluxation. Facets are aligned. Occipital condyles are normally positioned. Skull base and vertebrae: No acute fracture. Soft tissues and spinal canal: No prevertebral fluid or swelling. No visible canal hematoma.  Disc levels: Multilevel facet and uncovertebral hypertrophy, worst at left C2-5. Upper chest: No pneumothorax, pulmonary nodule or pleural effusion. Other: Normal visualized paraspinal cervical soft tissues. IMPRESSION: 1. No acute intracranial abnormality. 2. Right parietal scalp hematoma without skull fracture. 3. No acute fracture or static subluxation of the cervical spine. 4. Chronic small vessel ischemia. 5. Right-greater-than-left cervical facet arthrosis, worst at  the C2-C5 levels. Electronically Signed   By: Deatra Robinson M.D.   On: 10/08/2017 17:59   Ct Cervical Spine Wo Contrast  Result Date: 10/08/2017 CLINICAL DATA:  Fall while on anti coagulation therapy. EXAM: CT HEAD WITHOUT CONTRAST CT CERVICAL SPINE WITHOUT CONTRAST TECHNIQUE: Multidetector CT imaging of the head and cervical spine was performed following the standard protocol without intravenous contrast. Multiplanar CT image reconstructions of the cervical spine were also generated. COMPARISON:  None. FINDINGS: CT HEAD FINDINGS Brain: There is no mass, hemorrhage or extra-axial collection. The size and configuration of the ventricles and extra-axial CSF spaces are normal. There is hypoattenuation of the periventricular white matter, most commonly indicating chronic ischemic microangiopathy. Vascular: No abnormal hyperdensity of the major intracranial arteries or dural venous sinuses. No intracranial atherosclerosis. Skull: Small right parietal scalp hematoma.  No skull fracture. Sinuses/Orbits: No fluid levels or advanced mucosal thickening of the visualized paranasal sinuses. No mastoid or middle ear effusion. The orbits are normal. CT CERVICAL SPINE FINDINGS Alignment: No static subluxation. Facets are aligned. Occipital condyles are normally positioned. Skull base and vertebrae: No acute fracture. Soft tissues and spinal canal: No prevertebral fluid or swelling. No visible canal hematoma. Disc levels: Multilevel facet and uncovertebral  hypertrophy, worst at left C2-5. Upper chest: No pneumothorax, pulmonary nodule or pleural effusion. Other: Normal visualized paraspinal cervical soft tissues. IMPRESSION: 1. No acute intracranial abnormality. 2. Right parietal scalp hematoma without skull fracture. 3. No acute fracture or static subluxation of the cervical spine. 4. Chronic small vessel ischemia. 5. Right-greater-than-left cervical facet arthrosis, worst at the C2-C5 levels. Electronically Signed   By: Deatra Robinson M.D.   On: 10/08/2017 17:59   Dg Hip Unilat W Or Wo Pelvis 2-3 Views Left  Result Date: 10/08/2017 CLINICAL DATA:  Fall EXAM: DG HIP (WITH OR WITHOUT PELVIS) 2-3V LEFT COMPARISON:  None. FINDINGS: There is no evidence of hip fracture or dislocation. There is no evidence of arthropathy or other focal bone abnormality. IMPRESSION: No fracture or dislocation of the left hip. Electronically Signed   By: Deatra Robinson M.D.   On: 10/08/2017 17:32    Procedures .Marland KitchenLaceration Repair Date/Time: 10/08/2017 3:35 PM Performed by: Nash Dimmer, MD Authorized by: Nash Dimmer, MD   Consent:    Consent obtained:  Verbal   Consent given by:  Patient and spouse   Risks discussed:  Infection, poor wound healing and poor cosmetic result   Alternatives discussed:  No treatment Anesthesia (see MAR for exact dosages):    Anesthesia method:  Local infiltration   Local anesthetic:  Lidocaine 2% WITH epi Laceration details:    Location:  Face   Face location:  Forehead Repair type:    Repair type:  Simple Pre-procedure details:    Preparation:  Patient was prepped and draped in usual sterile fashion Exploration:    Hemostasis achieved with:  Epinephrine   Contaminated: yes   Treatment:    Area cleansed with:  Saline   Amount of cleaning:  Standard   Irrigation solution:  Sterile saline   Irrigation method:  Syringe   Visualized foreign bodies/material removed: no   Skin repair:    Repair method:  Sutures   Suture size:   5-0   Suture material:  Nylon   Suture technique:  Simple interrupted   Number of sutures:  3 Approximation:    Approximation:  Close Post-procedure details:    Dressing:  Antibiotic ointment   Patient tolerance of procedure:  Tolerated well, no  immediate complications   (including critical care time)  Medications Ordered in ED Medications  Tdap (BOOSTRIX) injection 0.5 mL (0.5 mLs Intramuscular Given 10/08/17 1616)  lidocaine-EPINEPHrine (XYLOCAINE W/EPI) 2 %-1:200000 (PF) injection 5 mL (5 mLs Infiltration Given by Other 10/08/17 2126)     Initial Impression / Assessment and Plan / ED Course  I have reviewed the triage vital signs and the nursing notes.  Pertinent labs & imaging results that were available during my care of the patient were reviewed by me and considered in my medical decision making (see chart for details).     Patient is an 84 year old man with past medical history of Alzheimer's disease who presents after a fall.  The patient lives and [place] and was alone in a wheelchair.  And the staff return to the room, he was on the floor beside his wheelchair.  Is unclear what occurred as patient does not remember.  He has a laceration to his right forehead that is hemostatic with pressure.  He has a deformity to his left hip that is not tender and bruising over the right chest wall that is also not tender.  Imaging reveals no fracture dislocation, intracranial bleeding.  Laceration repaired. Patient tolerated procedure well. The patient and his wife were given written and verbal instructions for wound care and suture removal. They report good understanding of and agreement with the plan. Patient was discharged to his facility.  Care supervised by Dr. Jeraldine Loots.  Nash Dimmer, MD  Final Clinical Impressions(s) / ED Diagnoses   Final diagnoses:  Injury of head, initial encounter  Fall in home, initial encounter  Facial laceration, initial encounter  Anemia,  unspecified type    ED Discharge Orders    None       Nash Dimmer, MD 10/09/17 1421    Gerhard Munch, MD 10/10/17 [phone]

## 2017-10-08 NOTE — Discharge Instructions (Addendum)
Repeat CBC in 1 day to trend hemoglobin. Hemoglobin 8.8 in emergency department.  Remove stitches in 5 days.   Return to the ED if your wound is red, warm, or draining pus.  Important:  As part of your work-up, we have identified an  incidental finding. While none of these are currently an emergency, it is VERY important for you to follow-up with your PCP and/or specialist as indicated. These findings were seen on your imaging today, and while many of these represent benign conditions, our radiologists have recommended that you follow-up as indicated to ensure there is no cancer or other underlying disease.  Concern for enlarging ascending aortic aneurysm. It may be increased in size compared to prior CT from 2015. Follow-up CTA of the chest necessary to evaluate the aorta.

## 2017-10-09 DIAGNOSIS — M2550 Pain in unspecified joint: Secondary | ICD-10-CM | POA: Diagnosis not present

## 2017-10-09 DIAGNOSIS — R296 Repeated falls: Secondary | ICD-10-CM | POA: Diagnosis not present

## 2017-10-09 DIAGNOSIS — R1312 Dysphagia, oropharyngeal phase: Secondary | ICD-10-CM | POA: Diagnosis not present

## 2017-10-09 DIAGNOSIS — F0391 Unspecified dementia with behavioral disturbance: Secondary | ICD-10-CM | POA: Diagnosis not present

## 2017-10-09 DIAGNOSIS — R2681 Unsteadiness on feet: Secondary | ICD-10-CM | POA: Diagnosis not present

## 2017-10-09 DIAGNOSIS — Z7401 Bed confinement status: Secondary | ICD-10-CM | POA: Diagnosis not present

## 2017-10-09 DIAGNOSIS — R262 Difficulty in walking, not elsewhere classified: Secondary | ICD-10-CM | POA: Diagnosis not present

## 2017-10-09 DIAGNOSIS — I2510 Atherosclerotic heart disease of native coronary artery without angina pectoris: Secondary | ICD-10-CM | POA: Diagnosis not present

## 2017-10-09 DIAGNOSIS — K219 Gastro-esophageal reflux disease without esophagitis: Secondary | ICD-10-CM | POA: Diagnosis not present

## 2017-10-10 ENCOUNTER — Other Ambulatory Visit (HOSPITAL_COMMUNITY): Payer: Self-pay | Admitting: Internal Medicine

## 2017-10-10 DIAGNOSIS — R2681 Unsteadiness on feet: Secondary | ICD-10-CM | POA: Diagnosis not present

## 2017-10-10 DIAGNOSIS — R1312 Dysphagia, oropharyngeal phase: Secondary | ICD-10-CM | POA: Diagnosis not present

## 2017-10-10 DIAGNOSIS — K219 Gastro-esophageal reflux disease without esophagitis: Secondary | ICD-10-CM | POA: Diagnosis not present

## 2017-10-10 DIAGNOSIS — I2510 Atherosclerotic heart disease of native coronary artery without angina pectoris: Secondary | ICD-10-CM | POA: Diagnosis not present

## 2017-10-10 DIAGNOSIS — R300 Dysuria: Secondary | ICD-10-CM | POA: Diagnosis not present

## 2017-10-10 DIAGNOSIS — R339 Retention of urine, unspecified: Secondary | ICD-10-CM | POA: Diagnosis not present

## 2017-10-10 DIAGNOSIS — R262 Difficulty in walking, not elsewhere classified: Secondary | ICD-10-CM | POA: Diagnosis not present

## 2017-10-10 DIAGNOSIS — R358 Other polyuria: Secondary | ICD-10-CM | POA: Diagnosis not present

## 2017-10-10 DIAGNOSIS — G4700 Insomnia, unspecified: Secondary | ICD-10-CM | POA: Diagnosis not present

## 2017-10-10 DIAGNOSIS — N138 Other obstructive and reflux uropathy: Secondary | ICD-10-CM | POA: Diagnosis not present

## 2017-10-10 DIAGNOSIS — R296 Repeated falls: Secondary | ICD-10-CM | POA: Diagnosis not present

## 2017-10-10 DIAGNOSIS — N401 Enlarged prostate with lower urinary tract symptoms: Secondary | ICD-10-CM | POA: Diagnosis not present

## 2017-10-10 DIAGNOSIS — F411 Generalized anxiety disorder: Secondary | ICD-10-CM | POA: Diagnosis not present

## 2017-10-10 DIAGNOSIS — F0391 Unspecified dementia with behavioral disturbance: Secondary | ICD-10-CM | POA: Diagnosis not present

## 2017-10-11 DIAGNOSIS — K219 Gastro-esophageal reflux disease without esophagitis: Secondary | ICD-10-CM | POA: Diagnosis not present

## 2017-10-11 DIAGNOSIS — R2681 Unsteadiness on feet: Secondary | ICD-10-CM | POA: Diagnosis not present

## 2017-10-11 DIAGNOSIS — J449 Chronic obstructive pulmonary disease, unspecified: Secondary | ICD-10-CM | POA: Diagnosis not present

## 2017-10-11 DIAGNOSIS — I2510 Atherosclerotic heart disease of native coronary artery without angina pectoris: Secondary | ICD-10-CM | POA: Diagnosis not present

## 2017-10-11 DIAGNOSIS — R296 Repeated falls: Secondary | ICD-10-CM | POA: Diagnosis not present

## 2017-10-11 DIAGNOSIS — R262 Difficulty in walking, not elsewhere classified: Secondary | ICD-10-CM | POA: Diagnosis not present

## 2017-10-11 DIAGNOSIS — R1312 Dysphagia, oropharyngeal phase: Secondary | ICD-10-CM | POA: Diagnosis not present

## 2017-10-11 DIAGNOSIS — F0391 Unspecified dementia with behavioral disturbance: Secondary | ICD-10-CM | POA: Diagnosis not present

## 2017-10-13 ENCOUNTER — Emergency Department (HOSPITAL_COMMUNITY): Payer: Medicare HMO

## 2017-10-13 ENCOUNTER — Emergency Department (HOSPITAL_COMMUNITY)
Admission: EM | Admit: 2017-10-13 | Discharge: 2017-10-14 | Disposition: A | Discharge status: Home / Self Care | Payer: Medicare HMO | Attending: Emergency Medicine | Admitting: Emergency Medicine

## 2017-10-13 ENCOUNTER — Encounter (HOSPITAL_COMMUNITY): Payer: Self-pay | Admitting: Emergency Medicine

## 2017-10-13 DIAGNOSIS — Y998 Other external cause status: Secondary | ICD-10-CM | POA: Diagnosis not present

## 2017-10-13 DIAGNOSIS — Y92129 Unspecified place in nursing home as the place of occurrence of the external cause: Secondary | ICD-10-CM | POA: Diagnosis not present

## 2017-10-13 DIAGNOSIS — I10 Essential (primary) hypertension: Secondary | ICD-10-CM | POA: Diagnosis not present

## 2017-10-13 DIAGNOSIS — S01111A Laceration without foreign body of right eyelid and periocular area, initial encounter: Secondary | ICD-10-CM | POA: Diagnosis not present

## 2017-10-13 DIAGNOSIS — F329 Major depressive disorder, single episode, unspecified: Secondary | ICD-10-CM | POA: Diagnosis not present

## 2017-10-13 DIAGNOSIS — Z96611 Presence of right artificial shoulder joint: Secondary | ICD-10-CM | POA: Insufficient documentation

## 2017-10-13 DIAGNOSIS — Z7902 Long term (current) use of antithrombotics/antiplatelets: Secondary | ICD-10-CM | POA: Insufficient documentation

## 2017-10-13 DIAGNOSIS — Y9389 Activity, other specified: Secondary | ICD-10-CM | POA: Insufficient documentation

## 2017-10-13 DIAGNOSIS — Z955 Presence of coronary angioplasty implant and graft: Secondary | ICD-10-CM | POA: Diagnosis not present

## 2017-10-13 DIAGNOSIS — S0181XA Laceration without foreign body of other part of head, initial encounter: Secondary | ICD-10-CM | POA: Diagnosis not present

## 2017-10-13 DIAGNOSIS — I2510 Atherosclerotic heart disease of native coronary artery without angina pectoris: Secondary | ICD-10-CM | POA: Diagnosis not present

## 2017-10-13 DIAGNOSIS — J449 Chronic obstructive pulmonary disease, unspecified: Secondary | ICD-10-CM | POA: Diagnosis not present

## 2017-10-13 DIAGNOSIS — F0390 Unspecified dementia without behavioral disturbance: Secondary | ICD-10-CM | POA: Insufficient documentation

## 2017-10-13 DIAGNOSIS — Z79899 Other long term (current) drug therapy: Secondary | ICD-10-CM | POA: Insufficient documentation

## 2017-10-13 DIAGNOSIS — F419 Anxiety disorder, unspecified: Secondary | ICD-10-CM | POA: Diagnosis not present

## 2017-10-13 DIAGNOSIS — Z87891 Personal history of nicotine dependence: Secondary | ICD-10-CM | POA: Diagnosis not present

## 2017-10-13 DIAGNOSIS — Z7982 Long term (current) use of aspirin: Secondary | ICD-10-CM | POA: Insufficient documentation

## 2017-10-13 DIAGNOSIS — W01198A Fall on same level from slipping, tripping and stumbling with subsequent striking against other object, initial encounter: Secondary | ICD-10-CM | POA: Insufficient documentation

## 2017-10-13 DIAGNOSIS — S0993XA Unspecified injury of face, initial encounter: Secondary | ICD-10-CM | POA: Diagnosis present

## 2017-10-13 DIAGNOSIS — S0990XA Unspecified injury of head, initial encounter: Secondary | ICD-10-CM | POA: Diagnosis not present

## 2017-10-13 DIAGNOSIS — W19XXXA Unspecified fall, initial encounter: Secondary | ICD-10-CM | POA: Diagnosis not present

## 2017-10-13 DIAGNOSIS — R404 Transient alteration of awareness: Secondary | ICD-10-CM | POA: Diagnosis not present

## 2017-10-13 MED ORDER — LIDOCAINE-EPINEPHRINE (PF) 2 %-1:200000 IJ SOLN
20.0000 mL | Freq: Once | INTRAMUSCULAR | Status: AC
  Administered 2017-10-13: 20 mL via INTRADERMAL
  Filled 2017-10-13: qty 20

## 2017-10-13 NOTE — ED Provider Notes (Signed)
[facility] EMERGENCY DEPARTMENT Provider Note   CSN: [id number] Arrival date & time: 10/13/17  1912     History   Chief Complaint Chief Complaint  Patient presents with  . Fall    HPI [name] is a 84 y.o. male.  The history is provided by the patient and the nursing home. The history is limited by the condition of the patient (Dementia).  Fall  This is a recurrent problem. The problem occurs every several days. The problem has not changed since onset.Pertinent negatives include no chest pain, no abdominal pain, no headaches and no shortness of breath. Nothing aggravates the symptoms. Nothing relieves the symptoms. He has tried nothing for the symptoms.    Past Medical History:  Diagnosis Date  . Allergic rhinitis   . Arthritis   . Constipation    takes Miralax daily as needed  . Coronary artery disease    s/p remote IWMI 2004 with PCI of the RCA with BMS and repeat cath 2007 due to recurrent CP and abnormal nuclear stress test and cath showed widely patent RCA stent with 30-50% distal stenosis before the takeoff of a PL and PDA and normal LVF.   [name] Depression with anxiety    takes Clonazepam nightly  . GERD (gastroesophageal reflux disease)    takes Omeprazole and Pepcid daily  . Heart attack (HCC)   . History of bronchitis    pulmonologist is Dr.Wert.  . Hyperlipidemia    takes Atorvastatin daily  . Hypertension    takes Diovan daily  . Joint pain   . Memory loss   . RLS (restless legs syndrome)   . Unspecified vitamin D deficiency   . Vitamin B 12 deficiency     Patient Active Problem List   Diagnosis Date Noted  . History of percutaneous coronary intervention 08/01/2017  . Shingles 08/01/2017  . Goals of care, counseling/discussion   . Palliative care by specialist   . Chest pain 07/25/2017  . Dementia 03/07/2015  . Fall (on) (from) other stairs and steps, sequela 03/07/2015  . S/P shoulder replacement 04/29/2014  . Essential  hypertension 03/11/2014  . Mild cognitive impairment 03/12/2013  . Anxiety state 03/12/2013  . CELLULITIS AND ABSCESS OF UPPER ARM AND FOREARM 07/05/2009  . Dyslipidemia 03/21/2007  . Non-ST elevation (NSTEMI) myocardial infarction (HCC) 03/21/2007  . ALLERGIC RHINITIS 03/21/2007  . COUGH 01/14/2007  . CAD (coronary artery disease), native coronary artery 01/13/2007  . CHRONIC RHINITIS 01/13/2007  . COPD pfts pending  01/13/2007  . Esophageal reflux 01/13/2007  . DYSPNEA 01/13/2007    Past Surgical History:  Procedure Laterality Date  . CARDIAC CATHETERIZATION  2004  . cataract surgery    . COLONOSCOPY    . CORONARY ANGIOPLASTY     1 stent  . ESOPHAGOGASTRODUODENOSCOPY    . HAND SURGERY Bilateral   . HERNIA REPAIR Left    inguinal   . NASAL SINUS SURGERY    . REVERSE SHOULDER ARTHROPLASTY Right 04/29/2014   Procedure: REVERSE TOTAL SHOULDER ARTHROPLASTY ;  Surgeon: Francena Hanly, MD;  Location: [facility] OR;  Service: Orthopedics;  Laterality: Right;  . stents          Home Medications    Prior to Admission medications   Medication Sig Start Date End Date Taking? Authorizing Provider  ALPRAZolam (XANAX) 0.25 MG tablet Take 0.25 mg by mouth 2 (two) times daily. 07/03/17   [provider]  aspirin EC 81 MG EC tablet Take 1  tablet (81 mg total) by mouth daily. 08/02/17   Filbert Schilder, NP  atorvastatin (LIPITOR) 20 MG tablet Take 1 tablet (20 mg total) by mouth daily at 6 PM. 08/01/17   Filbert Schilder, NP  carvedilol (COREG) 3.125 MG tablet Take 1 tablet (3.125 mg total) by mouth 2 (two) times daily with a meal. 08/01/17   Georgie Chard D, NP  chlorpheniramine (ALLERGY) 4 MG tablet Take 4 mg by mouth 2 (two) times daily as needed for allergies.    [provider]  clopidogrel (PLAVIX) 75 MG tablet Take 1 tablet (75 mg total) by mouth daily. 08/02/17   Filbert Schilder, NP  donepezil (ARICEPT) 10 MG tablet Take 1 tablet (10 mg total) by mouth at bedtime. Start 1/2  tablet daily x 4 weeks then 1 tablet daily 02/19/17   Micki Riley, MD  doxepin (SINEQUAN) 10 MG capsule Take 10 mg by mouth at bedtime.    [provider]  fluticasone (FLONASE) 50 MCG/ACT nasal spray Place 1 spray into both nostrils 2 (two) times daily. Reported on 03/07/2015    [provider]  guaiFENesin (MUCINEX) 600 MG 12 hr tablet Take 600 mg by mouth 2 (two) times daily.    [provider]  memantine (NAMENDA TITRATION PAK) tablet pack 5 mg/day for =1 week; 5 mg twice daily for =1 week; 15 mg/day given in 5 mg and 10 mg separated doses for =1 week; then 10 mg twice daily 06/17/17   George Hugh, NP  Multiple Vitamin (MULTIVITAMIN WITH MINERALS) TABS tablet Take 1 tablet by mouth daily.    [provider]  nitroGLYCERIN (NITROSTAT) 0.4 MG SL tablet Place 1 tablet (0.4 mg total) under the tongue every 5 (five) minutes x 3 doses as needed for chest pain. 08/01/17   Filbert Schilder, NP  tiotropium (SPIRIVA) 18 MCG inhalation capsule Place 1 capsule (18 mcg total) into inhaler and inhale daily. Patient not taking: Reported on 07/25/2017 03/31/16   Lurene Shadow, PA-C  Tiotropium Bromide-Olodaterol (STIOLTO RESPIMAT) 2.5-2.5 MCG/ACT AERS Inhale 2 puffs into the lungs at bedtime.    [provider]    Family History Family History  Problem Relation Age of Onset  . Asthma Mother   . Cancer - Prostate Father     Social History Social History   Tobacco Use  . Smoking status: Former Smoker    Packs/day: 3.00    Years: 15.00    Pack years: 45.00    Types: Cigarettes    Last attempt to quit: 02/06/1964    Years since quitting: 53.7  . Smokeless tobacco: Never Used  Substance Use Topics  . Alcohol use: No    Alcohol/week: 0.0 standard drinks  . Drug use: No     Allergies   Patient has no known allergies.   Review of Systems Review of Systems  Constitutional: Negative for fever.  HENT: Negative for congestion.   Eyes: Negative  for pain.  Respiratory: Negative for shortness of breath.   Cardiovascular: Negative for chest pain.  Gastrointestinal: Negative for abdominal pain.  Genitourinary: Negative for flank pain.  Musculoskeletal: Negative for back pain and neck pain.  Skin: Negative for wound.  Neurological: Negative for headaches.  Psychiatric/Behavioral: Positive for confusion.     Physical Exam Updated Vital Signs BP 105/70   Pulse 73   Temp 98.7 F (37.1 C) (Oral)   Resp 19   Ht 5\' 10"  (1.778 m)   Wt 70.2 kg  SpO2 99%   BMI 22.21 kg/m   Physical Exam  Constitutional: He appears well-developed and well-nourished.  HENT:  Head: Normocephalic.  Laceration to right eye  Eyes: Conjunctivae are normal.  Neck: Neck supple.  Cardiovascular: Normal rate and regular rhythm.  No murmur heard. Pulmonary/Chest: Effort normal and breath sounds normal. No respiratory distress.  Abdominal: Soft. There is no tenderness.  Musculoskeletal: He exhibits no edema.  Neurological: He is alert.  Skin: Skin is warm and dry.  Laceration, hemostatic  Psychiatric: He has a normal mood and affect.  Nursing note and vitals reviewed.    ED Treatments / Results  Labs (all labs ordered are listed, but only abnormal results are displayed) Labs Reviewed - No data to display  EKG None  Radiology No results found.  Procedures .Marland KitchenLaceration Repair Date/Time: 10/13/2017 7:36 PM Performed by: Nash Dimmer, MD Authorized by: Nash Dimmer, MD   Anesthesia (see MAR for exact dosages):    Anesthesia method:  Local infiltration   Local anesthetic:  Lidocaine 2% WITH epi Laceration details:    Location:  Face   Face location:  R upper eyelid   Extent:  Superficial   Length (cm):  3.5   Depth (mm):  3 Repair type:    Repair type:  Intermediate (small amount of debridement required) Pre-procedure details:    Preparation:  Patient was prepped and draped in usual sterile fashion Exploration:     Hemostasis achieved with:  Direct pressure   Wound exploration: entire depth of wound probed and visualized     Contaminated: yes   Treatment:    Area cleansed with:  Saline   Amount of cleaning:  Extensive   Irrigation solution:  Sterile saline   Irrigation volume:    Irrigation method:  Syringe   Visualized foreign bodies/material removed: no   Skin repair:    Repair method:  Sutures   Suture size:  5-0   Suture material:  Prolene   Suture technique:  Simple interrupted   Number of sutures:  8 Approximation:    Approximation:  Close Post-procedure details:    Dressing:  Non-adherent dressing   (including critical care time)  Medications Ordered in ED Medications  lidocaine-EPINEPHrine (XYLOCAINE W/EPI) 2 %-1:200000 (PF) injection 20 mL (20 mLs Intradermal Given 10/13/17 1932)     Initial Impression / Assessment and Plan / ED Course  I have reviewed the triage vital signs and the nursing notes.  Pertinent labs & imaging results that were available during my care of the patient were reviewed by me and considered in my medical decision making (see chart for details).     The patient is an 84 year old male with past medical history of dementia who presents after a fall.  The patient is a wheelchair user and does not ambulate on his own.  I attempted to speak with the patient's care provider at [facility] multiple times (I  called at 19:31 and 19:46) and was not able to speak to anyone who was involved in the patient's care. Unknown loss of consciousness, so will CT scan head.  CT scan shows no acute intracranial abnormalities.  Laceration was repaired and patient tolerated the procedure well.  Patient observed and had stable vital signs.  Patient was discharged back to his facility.  Patient was given verbal and written wound care instructions with instructions for follow-up and suture removal.  Patient care supervised Dr. Clarene Duke.  Nash Dimmer, MD   Final Clinical  Impressions(s) / ED  Diagnoses   Final diagnoses:  None    ED Discharge Orders    None       Nash Dimmer, MD 10/14/17 0419    Clarene Duke Ambrose Finland, MD 10/15/17 [phone]

## 2017-10-13 NOTE — ED Triage Notes (Signed)
Brought by Sharin Mons from [facility].  Per staff patient was sitting in a wheelchair and they ran to help another patient he got himself up and fell to the floor.  Lac to right forehead (had previous lac in same area).  Skin tear to right wrist.  Patient is alert to self at baseline. No LOC or c/o pain.

## 2017-10-14 DIAGNOSIS — J449 Chronic obstructive pulmonary disease, unspecified: Secondary | ICD-10-CM | POA: Diagnosis not present

## 2017-10-14 DIAGNOSIS — R1312 Dysphagia, oropharyngeal phase: Secondary | ICD-10-CM | POA: Diagnosis not present

## 2017-10-14 DIAGNOSIS — K219 Gastro-esophageal reflux disease without esophagitis: Secondary | ICD-10-CM | POA: Diagnosis not present

## 2017-10-14 DIAGNOSIS — I2510 Atherosclerotic heart disease of native coronary artery without angina pectoris: Secondary | ICD-10-CM | POA: Diagnosis not present

## 2017-10-14 DIAGNOSIS — R296 Repeated falls: Secondary | ICD-10-CM | POA: Diagnosis not present

## 2017-10-14 DIAGNOSIS — M2550 Pain in unspecified joint: Secondary | ICD-10-CM | POA: Diagnosis not present

## 2017-10-14 DIAGNOSIS — R2681 Unsteadiness on feet: Secondary | ICD-10-CM | POA: Diagnosis not present

## 2017-10-14 DIAGNOSIS — R262 Difficulty in walking, not elsewhere classified: Secondary | ICD-10-CM | POA: Diagnosis not present

## 2017-10-14 DIAGNOSIS — Z7401 Bed confinement status: Secondary | ICD-10-CM | POA: Diagnosis not present

## 2017-10-14 DIAGNOSIS — F0391 Unspecified dementia with behavioral disturbance: Secondary | ICD-10-CM | POA: Diagnosis not present

## 2017-10-14 NOTE — ED Notes (Signed)
[facility] CALLED BY Murline Weigel 

## 2017-10-14 NOTE — Discharge Instructions (Addendum)
Please remove your sutures in 5 days. Clean and re-dress wound every 12 hours. Apply neosporin to sutures every 12 hours. Return if signs of infection develop.

## 2017-10-15 DIAGNOSIS — I2510 Atherosclerotic heart disease of native coronary artery without angina pectoris: Secondary | ICD-10-CM | POA: Diagnosis not present

## 2017-10-15 DIAGNOSIS — R262 Difficulty in walking, not elsewhere classified: Secondary | ICD-10-CM | POA: Diagnosis not present

## 2017-10-15 DIAGNOSIS — R1312 Dysphagia, oropharyngeal phase: Secondary | ICD-10-CM | POA: Diagnosis not present

## 2017-10-15 DIAGNOSIS — K219 Gastro-esophageal reflux disease without esophagitis: Secondary | ICD-10-CM | POA: Diagnosis not present

## 2017-10-15 DIAGNOSIS — F0391 Unspecified dementia with behavioral disturbance: Secondary | ICD-10-CM | POA: Diagnosis not present

## 2017-10-15 DIAGNOSIS — R296 Repeated falls: Secondary | ICD-10-CM | POA: Diagnosis not present

## 2017-10-15 DIAGNOSIS — R2681 Unsteadiness on feet: Secondary | ICD-10-CM | POA: Diagnosis not present

## 2017-10-16 DIAGNOSIS — R262 Difficulty in walking, not elsewhere classified: Secondary | ICD-10-CM | POA: Diagnosis not present

## 2017-10-16 DIAGNOSIS — R1312 Dysphagia, oropharyngeal phase: Secondary | ICD-10-CM | POA: Diagnosis not present

## 2017-10-16 DIAGNOSIS — I2510 Atherosclerotic heart disease of native coronary artery without angina pectoris: Secondary | ICD-10-CM | POA: Diagnosis not present

## 2017-10-16 DIAGNOSIS — R296 Repeated falls: Secondary | ICD-10-CM | POA: Diagnosis not present

## 2017-10-16 DIAGNOSIS — F0391 Unspecified dementia with behavioral disturbance: Secondary | ICD-10-CM | POA: Diagnosis not present

## 2017-10-16 DIAGNOSIS — R2681 Unsteadiness on feet: Secondary | ICD-10-CM | POA: Diagnosis not present

## 2017-10-16 DIAGNOSIS — K219 Gastro-esophageal reflux disease without esophagitis: Secondary | ICD-10-CM | POA: Diagnosis not present

## 2017-10-17 DIAGNOSIS — I2510 Atherosclerotic heart disease of native coronary artery without angina pectoris: Secondary | ICD-10-CM | POA: Diagnosis not present

## 2017-10-17 DIAGNOSIS — R296 Repeated falls: Secondary | ICD-10-CM | POA: Diagnosis not present

## 2017-10-17 DIAGNOSIS — F0391 Unspecified dementia with behavioral disturbance: Secondary | ICD-10-CM | POA: Diagnosis not present

## 2017-10-17 DIAGNOSIS — R2681 Unsteadiness on feet: Secondary | ICD-10-CM | POA: Diagnosis not present

## 2017-10-17 DIAGNOSIS — K219 Gastro-esophageal reflux disease without esophagitis: Secondary | ICD-10-CM | POA: Diagnosis not present

## 2017-10-17 DIAGNOSIS — R262 Difficulty in walking, not elsewhere classified: Secondary | ICD-10-CM | POA: Diagnosis not present

## 2017-10-17 DIAGNOSIS — R1312 Dysphagia, oropharyngeal phase: Secondary | ICD-10-CM | POA: Diagnosis not present

## 2017-10-18 ENCOUNTER — Other Ambulatory Visit: Payer: Self-pay | Admitting: Internal Medicine

## 2017-10-18 DIAGNOSIS — I2510 Atherosclerotic heart disease of native coronary artery without angina pectoris: Secondary | ICD-10-CM

## 2017-10-18 DIAGNOSIS — I712 Thoracic aortic aneurysm, without rupture, unspecified: Secondary | ICD-10-CM

## 2017-10-18 DIAGNOSIS — R2681 Unsteadiness on feet: Secondary | ICD-10-CM | POA: Diagnosis not present

## 2017-10-18 DIAGNOSIS — F0391 Unspecified dementia with behavioral disturbance: Secondary | ICD-10-CM | POA: Diagnosis not present

## 2017-10-18 DIAGNOSIS — R296 Repeated falls: Secondary | ICD-10-CM | POA: Diagnosis not present

## 2017-10-18 DIAGNOSIS — R1312 Dysphagia, oropharyngeal phase: Secondary | ICD-10-CM | POA: Diagnosis not present

## 2017-10-18 DIAGNOSIS — K219 Gastro-esophageal reflux disease without esophagitis: Secondary | ICD-10-CM | POA: Diagnosis not present

## 2017-10-18 DIAGNOSIS — I5020 Unspecified systolic (congestive) heart failure: Secondary | ICD-10-CM

## 2017-10-18 DIAGNOSIS — R262 Difficulty in walking, not elsewhere classified: Secondary | ICD-10-CM | POA: Diagnosis not present

## 2017-10-19 DIAGNOSIS — F0391 Unspecified dementia with behavioral disturbance: Secondary | ICD-10-CM | POA: Diagnosis not present

## 2017-10-19 DIAGNOSIS — I2510 Atherosclerotic heart disease of native coronary artery without angina pectoris: Secondary | ICD-10-CM | POA: Diagnosis not present

## 2017-10-19 DIAGNOSIS — R2681 Unsteadiness on feet: Secondary | ICD-10-CM | POA: Diagnosis not present

## 2017-10-19 DIAGNOSIS — R1312 Dysphagia, oropharyngeal phase: Secondary | ICD-10-CM | POA: Diagnosis not present

## 2017-10-19 DIAGNOSIS — R262 Difficulty in walking, not elsewhere classified: Secondary | ICD-10-CM | POA: Diagnosis not present

## 2017-10-19 DIAGNOSIS — R296 Repeated falls: Secondary | ICD-10-CM | POA: Diagnosis not present

## 2017-10-19 DIAGNOSIS — K219 Gastro-esophageal reflux disease without esophagitis: Secondary | ICD-10-CM | POA: Diagnosis not present

## 2017-10-21 DIAGNOSIS — I2510 Atherosclerotic heart disease of native coronary artery without angina pectoris: Secondary | ICD-10-CM | POA: Diagnosis not present

## 2017-10-21 DIAGNOSIS — R2681 Unsteadiness on feet: Secondary | ICD-10-CM | POA: Diagnosis not present

## 2017-10-21 DIAGNOSIS — K219 Gastro-esophageal reflux disease without esophagitis: Secondary | ICD-10-CM | POA: Diagnosis not present

## 2017-10-21 DIAGNOSIS — R1312 Dysphagia, oropharyngeal phase: Secondary | ICD-10-CM | POA: Diagnosis not present

## 2017-10-21 DIAGNOSIS — F0391 Unspecified dementia with behavioral disturbance: Secondary | ICD-10-CM | POA: Diagnosis not present

## 2017-10-21 DIAGNOSIS — R296 Repeated falls: Secondary | ICD-10-CM | POA: Diagnosis not present

## 2017-10-21 DIAGNOSIS — R262 Difficulty in walking, not elsewhere classified: Secondary | ICD-10-CM | POA: Diagnosis not present

## 2017-10-22 DIAGNOSIS — R2681 Unsteadiness on feet: Secondary | ICD-10-CM | POA: Diagnosis not present

## 2017-10-22 DIAGNOSIS — R1312 Dysphagia, oropharyngeal phase: Secondary | ICD-10-CM | POA: Diagnosis not present

## 2017-10-22 DIAGNOSIS — F0391 Unspecified dementia with behavioral disturbance: Secondary | ICD-10-CM | POA: Diagnosis not present

## 2017-10-22 DIAGNOSIS — R296 Repeated falls: Secondary | ICD-10-CM | POA: Diagnosis not present

## 2017-10-22 DIAGNOSIS — I2510 Atherosclerotic heart disease of native coronary artery without angina pectoris: Secondary | ICD-10-CM | POA: Diagnosis not present

## 2017-10-22 DIAGNOSIS — R262 Difficulty in walking, not elsewhere classified: Secondary | ICD-10-CM | POA: Diagnosis not present

## 2017-10-22 DIAGNOSIS — K219 Gastro-esophageal reflux disease without esophagitis: Secondary | ICD-10-CM | POA: Diagnosis not present

## 2017-10-23 ENCOUNTER — Ambulatory Visit (HOSPITAL_COMMUNITY)
Admission: RE | Admit: 2017-10-23 | Discharge: 2017-10-23 | Disposition: A | Discharge status: Home / Self Care | Payer: Medicare HMO | Source: Ambulatory Visit | Attending: Internal Medicine | Admitting: Internal Medicine

## 2017-10-23 DIAGNOSIS — I712 Thoracic aortic aneurysm, without rupture, unspecified: Secondary | ICD-10-CM

## 2017-10-23 DIAGNOSIS — R296 Repeated falls: Secondary | ICD-10-CM | POA: Diagnosis not present

## 2017-10-23 DIAGNOSIS — I2510 Atherosclerotic heart disease of native coronary artery without angina pectoris: Secondary | ICD-10-CM

## 2017-10-23 DIAGNOSIS — K219 Gastro-esophageal reflux disease without esophagitis: Secondary | ICD-10-CM | POA: Diagnosis not present

## 2017-10-23 DIAGNOSIS — I7101 Dissection of thoracic aorta: Secondary | ICD-10-CM | POA: Diagnosis not present

## 2017-10-23 DIAGNOSIS — R1312 Dysphagia, oropharyngeal phase: Secondary | ICD-10-CM | POA: Diagnosis not present

## 2017-10-23 DIAGNOSIS — I700 Atherosclerosis of aorta: Secondary | ICD-10-CM | POA: Diagnosis not present

## 2017-10-23 DIAGNOSIS — I5020 Unspecified systolic (congestive) heart failure: Secondary | ICD-10-CM

## 2017-10-23 DIAGNOSIS — J432 Centrilobular emphysema: Secondary | ICD-10-CM | POA: Insufficient documentation

## 2017-10-23 DIAGNOSIS — R262 Difficulty in walking, not elsewhere classified: Secondary | ICD-10-CM | POA: Diagnosis not present

## 2017-10-23 DIAGNOSIS — R2681 Unsteadiness on feet: Secondary | ICD-10-CM | POA: Diagnosis not present

## 2017-10-23 DIAGNOSIS — F0391 Unspecified dementia with behavioral disturbance: Secondary | ICD-10-CM | POA: Diagnosis not present

## 2017-10-23 MED ORDER — IOPAMIDOL (ISOVUE-370) INJECTION 76%
INTRAVENOUS | Status: AC
  Filled 2017-10-23: qty 100

## 2017-10-23 MED ORDER — IOPAMIDOL (ISOVUE-370) INJECTION 76%
100.0000 mL | Freq: Once | INTRAVENOUS | Status: AC | PRN
  Administered 2017-10-23: 100 mL via INTRAVENOUS

## 2017-10-24 DIAGNOSIS — D696 Thrombocytopenia, unspecified: Secondary | ICD-10-CM | POA: Diagnosis not present

## 2017-10-24 DIAGNOSIS — I2510 Atherosclerotic heart disease of native coronary artery without angina pectoris: Secondary | ICD-10-CM | POA: Diagnosis not present

## 2017-10-24 DIAGNOSIS — R1312 Dysphagia, oropharyngeal phase: Secondary | ICD-10-CM | POA: Diagnosis not present

## 2017-10-24 DIAGNOSIS — R2681 Unsteadiness on feet: Secondary | ICD-10-CM | POA: Diagnosis not present

## 2017-10-24 DIAGNOSIS — I4891 Unspecified atrial fibrillation: Secondary | ICD-10-CM | POA: Diagnosis not present

## 2017-10-24 DIAGNOSIS — R296 Repeated falls: Secondary | ICD-10-CM | POA: Diagnosis not present

## 2017-10-24 DIAGNOSIS — F322 Major depressive disorder, single episode, severe without psychotic features: Secondary | ICD-10-CM | POA: Diagnosis not present

## 2017-10-24 DIAGNOSIS — R262 Difficulty in walking, not elsewhere classified: Secondary | ICD-10-CM | POA: Diagnosis not present

## 2017-10-24 DIAGNOSIS — F0390 Unspecified dementia without behavioral disturbance: Secondary | ICD-10-CM | POA: Diagnosis not present

## 2017-10-24 DIAGNOSIS — I712 Thoracic aortic aneurysm, without rupture: Secondary | ICD-10-CM | POA: Diagnosis not present

## 2017-10-24 DIAGNOSIS — J449 Chronic obstructive pulmonary disease, unspecified: Secondary | ICD-10-CM | POA: Diagnosis not present

## 2017-10-24 DIAGNOSIS — K219 Gastro-esophageal reflux disease without esophagitis: Secondary | ICD-10-CM | POA: Diagnosis not present

## 2017-10-24 DIAGNOSIS — F0391 Unspecified dementia with behavioral disturbance: Secondary | ICD-10-CM | POA: Diagnosis not present

## 2017-10-24 DIAGNOSIS — I509 Heart failure, unspecified: Secondary | ICD-10-CM | POA: Diagnosis not present

## 2017-10-24 DIAGNOSIS — R2689 Other abnormalities of gait and mobility: Secondary | ICD-10-CM | POA: Diagnosis not present

## 2017-10-25 DIAGNOSIS — F0391 Unspecified dementia with behavioral disturbance: Secondary | ICD-10-CM | POA: Diagnosis not present

## 2017-10-25 DIAGNOSIS — R2681 Unsteadiness on feet: Secondary | ICD-10-CM | POA: Diagnosis not present

## 2017-10-25 DIAGNOSIS — I2510 Atherosclerotic heart disease of native coronary artery without angina pectoris: Secondary | ICD-10-CM | POA: Diagnosis not present

## 2017-10-25 DIAGNOSIS — R296 Repeated falls: Secondary | ICD-10-CM | POA: Diagnosis not present

## 2017-10-25 DIAGNOSIS — R1312 Dysphagia, oropharyngeal phase: Secondary | ICD-10-CM | POA: Diagnosis not present

## 2017-10-25 DIAGNOSIS — K219 Gastro-esophageal reflux disease without esophagitis: Secondary | ICD-10-CM | POA: Diagnosis not present

## 2017-10-25 DIAGNOSIS — R262 Difficulty in walking, not elsewhere classified: Secondary | ICD-10-CM | POA: Diagnosis not present

## 2017-10-28 DIAGNOSIS — N401 Enlarged prostate with lower urinary tract symptoms: Secondary | ICD-10-CM | POA: Diagnosis not present

## 2017-10-28 DIAGNOSIS — F0391 Unspecified dementia with behavioral disturbance: Secondary | ICD-10-CM | POA: Diagnosis not present

## 2017-10-28 DIAGNOSIS — K219 Gastro-esophageal reflux disease without esophagitis: Secondary | ICD-10-CM | POA: Diagnosis not present

## 2017-10-28 DIAGNOSIS — N138 Other obstructive and reflux uropathy: Secondary | ICD-10-CM | POA: Diagnosis not present

## 2017-10-28 DIAGNOSIS — R2681 Unsteadiness on feet: Secondary | ICD-10-CM | POA: Diagnosis not present

## 2017-10-28 DIAGNOSIS — R262 Difficulty in walking, not elsewhere classified: Secondary | ICD-10-CM | POA: Diagnosis not present

## 2017-10-28 DIAGNOSIS — R296 Repeated falls: Secondary | ICD-10-CM | POA: Diagnosis not present

## 2017-10-28 DIAGNOSIS — R1312 Dysphagia, oropharyngeal phase: Secondary | ICD-10-CM | POA: Diagnosis not present

## 2017-10-28 DIAGNOSIS — I2510 Atherosclerotic heart disease of native coronary artery without angina pectoris: Secondary | ICD-10-CM | POA: Diagnosis not present

## 2017-10-29 DIAGNOSIS — R262 Difficulty in walking, not elsewhere classified: Secondary | ICD-10-CM | POA: Diagnosis not present

## 2017-10-29 DIAGNOSIS — R2681 Unsteadiness on feet: Secondary | ICD-10-CM | POA: Diagnosis not present

## 2017-10-29 DIAGNOSIS — R1312 Dysphagia, oropharyngeal phase: Secondary | ICD-10-CM | POA: Diagnosis not present

## 2017-10-29 DIAGNOSIS — R296 Repeated falls: Secondary | ICD-10-CM | POA: Diagnosis not present

## 2017-10-29 DIAGNOSIS — I2510 Atherosclerotic heart disease of native coronary artery without angina pectoris: Secondary | ICD-10-CM | POA: Diagnosis not present

## 2017-10-29 DIAGNOSIS — K219 Gastro-esophageal reflux disease without esophagitis: Secondary | ICD-10-CM | POA: Diagnosis not present

## 2017-10-29 DIAGNOSIS — F0391 Unspecified dementia with behavioral disturbance: Secondary | ICD-10-CM | POA: Diagnosis not present

## 2017-10-30 DIAGNOSIS — R262 Difficulty in walking, not elsewhere classified: Secondary | ICD-10-CM | POA: Diagnosis not present

## 2017-10-30 DIAGNOSIS — F411 Generalized anxiety disorder: Secondary | ICD-10-CM | POA: Diagnosis not present

## 2017-10-30 DIAGNOSIS — K219 Gastro-esophageal reflux disease without esophagitis: Secondary | ICD-10-CM | POA: Diagnosis not present

## 2017-10-30 DIAGNOSIS — R296 Repeated falls: Secondary | ICD-10-CM | POA: Diagnosis not present

## 2017-10-30 DIAGNOSIS — R1312 Dysphagia, oropharyngeal phase: Secondary | ICD-10-CM | POA: Diagnosis not present

## 2017-10-30 DIAGNOSIS — F0391 Unspecified dementia with behavioral disturbance: Secondary | ICD-10-CM | POA: Diagnosis not present

## 2017-10-30 DIAGNOSIS — R2681 Unsteadiness on feet: Secondary | ICD-10-CM | POA: Diagnosis not present

## 2017-10-30 DIAGNOSIS — I2510 Atherosclerotic heart disease of native coronary artery without angina pectoris: Secondary | ICD-10-CM | POA: Diagnosis not present

## 2017-10-31 DIAGNOSIS — R1312 Dysphagia, oropharyngeal phase: Secondary | ICD-10-CM | POA: Diagnosis not present

## 2017-10-31 DIAGNOSIS — I2510 Atherosclerotic heart disease of native coronary artery without angina pectoris: Secondary | ICD-10-CM | POA: Diagnosis not present

## 2017-10-31 DIAGNOSIS — R2681 Unsteadiness on feet: Secondary | ICD-10-CM | POA: Diagnosis not present

## 2017-10-31 DIAGNOSIS — R296 Repeated falls: Secondary | ICD-10-CM | POA: Diagnosis not present

## 2017-10-31 DIAGNOSIS — F0391 Unspecified dementia with behavioral disturbance: Secondary | ICD-10-CM | POA: Diagnosis not present

## 2017-10-31 DIAGNOSIS — K219 Gastro-esophageal reflux disease without esophagitis: Secondary | ICD-10-CM | POA: Diagnosis not present

## 2017-10-31 DIAGNOSIS — R262 Difficulty in walking, not elsewhere classified: Secondary | ICD-10-CM | POA: Diagnosis not present

## 2017-11-01 DIAGNOSIS — K219 Gastro-esophageal reflux disease without esophagitis: Secondary | ICD-10-CM | POA: Diagnosis not present

## 2017-11-01 DIAGNOSIS — R1312 Dysphagia, oropharyngeal phase: Secondary | ICD-10-CM | POA: Diagnosis not present

## 2017-11-01 DIAGNOSIS — F0391 Unspecified dementia with behavioral disturbance: Secondary | ICD-10-CM | POA: Diagnosis not present

## 2017-11-01 DIAGNOSIS — R262 Difficulty in walking, not elsewhere classified: Secondary | ICD-10-CM | POA: Diagnosis not present

## 2017-11-01 DIAGNOSIS — R2681 Unsteadiness on feet: Secondary | ICD-10-CM | POA: Diagnosis not present

## 2017-11-01 DIAGNOSIS — I2510 Atherosclerotic heart disease of native coronary artery without angina pectoris: Secondary | ICD-10-CM | POA: Diagnosis not present

## 2017-11-01 DIAGNOSIS — R296 Repeated falls: Secondary | ICD-10-CM | POA: Diagnosis not present

## 2017-11-04 DIAGNOSIS — R296 Repeated falls: Secondary | ICD-10-CM | POA: Diagnosis not present

## 2017-11-04 DIAGNOSIS — R2681 Unsteadiness on feet: Secondary | ICD-10-CM | POA: Diagnosis not present

## 2017-11-04 DIAGNOSIS — K219 Gastro-esophageal reflux disease without esophagitis: Secondary | ICD-10-CM | POA: Diagnosis not present

## 2017-11-04 DIAGNOSIS — F0391 Unspecified dementia with behavioral disturbance: Secondary | ICD-10-CM | POA: Diagnosis not present

## 2017-11-04 DIAGNOSIS — R1312 Dysphagia, oropharyngeal phase: Secondary | ICD-10-CM | POA: Diagnosis not present

## 2017-11-04 DIAGNOSIS — R262 Difficulty in walking, not elsewhere classified: Secondary | ICD-10-CM | POA: Diagnosis not present

## 2017-11-04 DIAGNOSIS — I2510 Atherosclerotic heart disease of native coronary artery without angina pectoris: Secondary | ICD-10-CM | POA: Diagnosis not present

## 2017-11-05 DIAGNOSIS — R296 Repeated falls: Secondary | ICD-10-CM | POA: Diagnosis not present

## 2017-11-05 DIAGNOSIS — F0391 Unspecified dementia with behavioral disturbance: Secondary | ICD-10-CM | POA: Diagnosis not present

## 2017-11-05 DIAGNOSIS — R262 Difficulty in walking, not elsewhere classified: Secondary | ICD-10-CM | POA: Diagnosis not present

## 2017-11-06 DIAGNOSIS — R296 Repeated falls: Secondary | ICD-10-CM | POA: Diagnosis not present

## 2017-11-06 DIAGNOSIS — R262 Difficulty in walking, not elsewhere classified: Secondary | ICD-10-CM | POA: Diagnosis not present

## 2017-11-06 DIAGNOSIS — F0391 Unspecified dementia with behavioral disturbance: Secondary | ICD-10-CM | POA: Diagnosis not present

## 2017-11-07 DIAGNOSIS — R262 Difficulty in walking, not elsewhere classified: Secondary | ICD-10-CM | POA: Diagnosis not present

## 2017-11-07 DIAGNOSIS — R05 Cough: Secondary | ICD-10-CM | POA: Diagnosis not present

## 2017-11-07 DIAGNOSIS — F0391 Unspecified dementia with behavioral disturbance: Secondary | ICD-10-CM | POA: Diagnosis not present

## 2017-11-07 DIAGNOSIS — R296 Repeated falls: Secondary | ICD-10-CM | POA: Diagnosis not present

## 2017-11-08 DIAGNOSIS — R262 Difficulty in walking, not elsewhere classified: Secondary | ICD-10-CM | POA: Diagnosis not present

## 2017-11-08 DIAGNOSIS — F0391 Unspecified dementia with behavioral disturbance: Secondary | ICD-10-CM | POA: Diagnosis not present

## 2017-11-08 DIAGNOSIS — R296 Repeated falls: Secondary | ICD-10-CM | POA: Diagnosis not present

## 2017-11-11 DIAGNOSIS — R296 Repeated falls: Secondary | ICD-10-CM | POA: Diagnosis not present

## 2017-11-11 DIAGNOSIS — R262 Difficulty in walking, not elsewhere classified: Secondary | ICD-10-CM | POA: Diagnosis not present

## 2017-11-11 DIAGNOSIS — F0391 Unspecified dementia with behavioral disturbance: Secondary | ICD-10-CM | POA: Diagnosis not present

## 2017-11-12 DIAGNOSIS — R296 Repeated falls: Secondary | ICD-10-CM | POA: Diagnosis not present

## 2017-11-12 DIAGNOSIS — R262 Difficulty in walking, not elsewhere classified: Secondary | ICD-10-CM | POA: Diagnosis not present

## 2017-11-12 DIAGNOSIS — F0391 Unspecified dementia with behavioral disturbance: Secondary | ICD-10-CM | POA: Diagnosis not present

## 2017-11-13 ENCOUNTER — Encounter: Payer: Self-pay | Admitting: Thoracic Surgery (Cardiothoracic Vascular Surgery)

## 2017-11-13 DIAGNOSIS — G4700 Insomnia, unspecified: Secondary | ICD-10-CM | POA: Diagnosis not present

## 2017-11-13 DIAGNOSIS — F411 Generalized anxiety disorder: Secondary | ICD-10-CM | POA: Diagnosis not present

## 2017-11-13 DIAGNOSIS — R296 Repeated falls: Secondary | ICD-10-CM | POA: Diagnosis not present

## 2017-11-13 DIAGNOSIS — R262 Difficulty in walking, not elsewhere classified: Secondary | ICD-10-CM | POA: Diagnosis not present

## 2017-11-13 DIAGNOSIS — F0391 Unspecified dementia with behavioral disturbance: Secondary | ICD-10-CM | POA: Diagnosis not present

## 2017-11-14 ENCOUNTER — Encounter: Payer: Medicare HMO | Admitting: Cardiothoracic Surgery

## 2017-11-15 ENCOUNTER — Encounter: Payer: Self-pay | Admitting: Cardiothoracic Surgery

## 2017-11-15 ENCOUNTER — Institutional Professional Consult (permissible substitution) (INDEPENDENT_AMBULATORY_CARE_PROVIDER_SITE_OTHER): Payer: Medicare HMO | Admitting: Cardiothoracic Surgery

## 2017-11-15 ENCOUNTER — Other Ambulatory Visit: Payer: Self-pay

## 2017-11-15 VITALS — BP 110/58 | HR 72 | Resp 18 | Ht 68.0 in | Wt 155.0 lb

## 2017-11-15 DIAGNOSIS — I712 Thoracic aortic aneurysm, without rupture, unspecified: Secondary | ICD-10-CM

## 2017-11-15 NOTE — Progress Notes (Signed)
[address]       [name] [address]             [phone]                    Douglas A Blumenstein [facility]ulloch Medical Record #[id number] Date of Birth: [birth year]  Referring: No ref. provider found Primary Care: Merrilee Seashore, MD Primary Cardiologist: Fransico Him, MD  Chief Complaint:    Chief Complaint  Patient presents with  . Thoracic Aortic Aneurysm    new patient consultation, CTA 10/23/17    History of Present Illness:    [name] 84 y.o. male is seen in the office  today for evaluation of ascending aortic dissection,  several weeks ago when he fell in the nursing home he was taken to the [facility] emergency room.  A CT scan was performed at that time that revealed a type I aortic dissection.  The patient was returned to the nursing home without surgical consideration for his aneurysm.  In June 2019 he was admitted to [facility] with acute myocardial infarction elevated troponins, but no further evaluation was performed.   The patient is currently in the Alzheimer's unit, the caregiver who comes with him notes that he falls frequently, he is able to answer a few very simple questions.    It is unclear the time course of this dissection, type I.  On the scan 2 weeks ago there is no evidence of periaortic hematoma so it is likely that this occurred more then a  months ago.    Current Activity/ Functional Status:  Patient is not independent with mobility/ambulation, transfers, ADL's, IADL's.   Zubrod Score: At the time of surgery this patient's most appropriate activity status/level should be described as: '[]'     0    Normal activity, no symptoms '[]'     1    Restricted in physical strenuous activity but ambulatory, able to do out light work '[]'     2    Ambulatory and capable of self care, unable to do work activities, up and about               >50 % of waking hours                              '[]'     3    Only limited self care, in bed greater than 50% of  waking hours '[x]'     4    Completely disabled, no self care, confined to bed or chair '[]'     5    Moribund   Past Medical History:  Diagnosis Date  . Allergic rhinitis   . Arthritis   . Constipation    takes Miralax daily as needed  . Coronary artery disease    s/p remote IWMI 2004 with PCI of the RCA with BMS and repeat cath 2007 due to recurrent CP and abnormal nuclear stress test and cath showed widely patent RCA stent with 30-50% distal stenosis before the takeoff of a PL and PDA and normal LVF.   [name] Depression with anxiety    takes Clonazepam nightly  . GERD (gastroesophageal reflux disease)    takes Omeprazole and Pepcid daily  . Heart attack ([place])   . History of bronchitis    pulmonologist is Dr.Wert.  . Hyperlipidemia    takes Atorvastatin daily  . Hypertension  takes Diovan daily  . Joint pain   . Memory loss   . RLS (restless legs syndrome)   . Unspecified vitamin D deficiency   . Vitamin B 12 deficiency     Past Surgical History:  Procedure Laterality Date  . CARDIAC CATHETERIZATION  2004  . cataract surgery    . COLONOSCOPY    . CORONARY ANGIOPLASTY     1 stent  . ESOPHAGOGASTRODUODENOSCOPY    . HAND SURGERY Bilateral   . HERNIA REPAIR Left    inguinal   . NASAL SINUS SURGERY    . REVERSE SHOULDER ARTHROPLASTY Right 04/29/2014   Procedure: REVERSE TOTAL SHOULDER ARTHROPLASTY ;  Surgeon: Justice Britain, MD;  Location: [facility];  Service: Orthopedics;  Laterality: Right;  . stents      Family History  Problem Relation Age of Onset  . Asthma Mother   . Cancer - Prostate Father      Social History   Tobacco Use  Smoking Status Former Smoker  . Packs/day: 3.00  . Years: 15.00  . Pack years: 45.00  . Types: Cigarettes  . Last attempt to quit: 02/06/1964  . Years since quitting: 53.8  Smokeless Tobacco Never Used    Social History   Substance and Sexual Activity  Alcohol Use No  . Alcohol/week: 0.0 standard drinks     No Known  Allergies  Current Outpatient Medications  Medication Sig Dispense Refill  . aspirin EC 81 MG EC tablet Take 1 tablet (81 mg total) by mouth daily. 90 tablet 4  . atorvastatin (LIPITOR) 20 MG tablet Take 1 tablet (20 mg total) by mouth daily at 6 PM. (Patient taking differently: Take 20 mg by mouth at bedtime. ) 90 tablet 4  . carvedilol (COREG) 3.125 MG tablet Take 1 tablet (3.125 mg total) by mouth 2 (two) times daily with a meal. 180 tablet 4  . chlorpheniramine (ALLERGY) 4 MG tablet Take 4 mg by mouth 2 (two) times daily.     . clopidogrel (PLAVIX) 75 MG tablet Take 1 tablet (75 mg total) by mouth daily. 90 tablet 4  . donepezil (ARICEPT) 10 MG tablet Take 1 tablet (10 mg total) by mouth at bedtime. Start 1/2 tablet daily x 4 weeks then 1 tablet daily (Patient taking differently: Take 10 mg by mouth every morning. ) 30 tablet 3  . escitalopram (LEXAPRO) 5 MG tablet Take 5 mg by mouth every morning.    . fluticasone (FLONASE) 50 MCG/ACT nasal spray Place 1 spray into both nostrils 2 (two) times daily. Reported on 03/07/2015    . guaiFENesin (MUCINEX) 600 MG 12 hr tablet Take 600 mg by mouth 2 (two) times daily.    . memantine (NAMENDA TITRATION PAK) tablet pack 5 mg/day for =1 week; 5 mg twice daily for =1 week; 15 mg/day given in 5 mg and 10 mg separated doses for =1 week; then 10 mg twice daily (Patient taking differently: Take 10 mg by mouth 2 (two) times daily. ) 49 tablet 0  . Multiple Vitamin (MULTIVITAMIN WITH MINERALS) TABS tablet Take 1 tablet by mouth daily.    . nitroGLYCERIN (NITROSTAT) 0.4 MG SL tablet Place 1 tablet (0.4 mg total) under the tongue every 5 (five) minutes x 3 doses as needed for chest pain. 25 tablet 0  . tiotropium (SPIRIVA) 18 MCG inhalation capsule Place 1 capsule (18 mcg total) into inhaler and inhale daily. 30 capsule 0  . Tiotropium Bromide-Olodaterol (STIOLTO RESPIMAT) 2.5-2.5 MCG/ACT AERS Inhale 2 puffs  into the lungs at bedtime.    . traZODone (DESYREL) 50 MG  tablet Take 50 mg by mouth at bedtime.     No current facility-administered medications for this visit.       Review of Systems: Patient was asked review of system questions but his responses were unreliable and inconsistent.     Cardiac Review of Systems: [Y] = yes  or   [ N ] = no   Chest Pain [    ]  Resting SOB [   ] Exertional SOB  [  ]  Orthopnea [  ]   Pedal Edema [   ]    Palpitations [  ] Syncope  [  ]   Presyncope [   ]   General Review of Systems: [Y] = yes [  ]=no Constitional: recent weight change [  ];  Wt loss over the last 3 months [   ] anorexia [  ]; fatigue [  ]; nausea [  ]; night sweats [  ]; fever [  ]; or chills [  ];           Eye : blurred vision [  ]; diplopia [   ]; vision changes [  ];  Amaurosis fugax[  ]; Resp: cough [  ];  wheezing[  ];  hemoptysis[  ]; shortness of breath[  ]; paroxysmal nocturnal dyspnea[  ]; dyspnea on exertion[  ]; or orthopnea[  ];  GI:  gallstones[  ], vomiting[  ];  dysphagia[  ]; melena[  ];  hematochezia [  ]; heartburn[  ];   Hx of  Colonoscopy[  ]; GU: kidney stones [  ]; hematuria[  ];   dysuria [  ];  nocturia[  ];  history of     obstruction [  ]; urinary frequency [  ]             Skin: rash, swelling[  ];, hair loss[  ];  peripheral edema[  ];  or itching[  ]; Musculosketetal: myalgias[  ];  joint swelling[  ];  joint erythema[  ];  joint pain[  ];  back pain[  ];  Heme/Lymph: bruising[  ];  bleeding[  ];  anemia[  ];  Neuro: TIA[  ];  headaches[  ];  stroke[  ];  vertigo[  ];  seizures[  ];   paresthesias[  ];  difficulty walking[  ];  Psych:depression[  ]; anxiety[  ];  Endocrine: diabetes[  ];  thyroid dysfunction[  ];  Immunizations: Flu up to date [  ]; Pneumococcal up to date [  ];  Other:     PHYSICAL EXAMINATION: BP (!) 110/58 (BP Location: Left Arm, Patient Position: Sitting, Cuff Size: Normal)   Pulse 72   Resp 18   Ht '5\' 8"'  (1.727 m)   Wt 155 lb (70.3 kg)   SpO2 100% Comment: RA  BMI 23.57 kg/m  General  appearance: alert and slowed mentation Head: Normocephalic, without obvious abnormality, atraumatic Neck: no adenopathy, no carotid bruit, no JVD, supple, symmetrical, trachea midline and thyroid not enlarged, symmetric, no tenderness/mass/nodules Lymph nodes: Cervical, supraclavicular, and axillary nodes normal. Resp: clear to auscultation bilaterally Back: symmetric, no curvature. ROM normal. No CVA tenderness. Cardio: regular rate and rhythm and diastolic murmur: mid diastolic 2/6, crescendo and decrescendo at lower left sternal border GI: soft, non-tender; bowel sounds normal; no masses,  no organomegaly Extremities: extremities normal, atraumatic, no cyanosis or edema and Homans sign is  negative, no sign of DVT Neurologic: Patient has severe cognitive problems unable to reliable only give any answers other than 1 or 2 words.  Diagnostic Studies & Laboratory data:     Recent Radiology Findings:   Ct Angio Chest Aorta W/cm &/or Wo/cm  Result Date: 10/23/2017 CLINICAL DATA:  Evaluate thoracic aortic aneurysm. EXAM: CT ANGIOGRAPHY CHEST WITH CONTRAST TECHNIQUE: Multidetector CT imaging of the chest was performed using the standard protocol during bolus administration of intravenous contrast. Multiplanar CT image reconstructions and MIPs were obtained to evaluate the vascular anatomy. CONTRAST:  129m ISOVUE-370 IOPAMIDOL (ISOVUE-370) INJECTION 76% COMPARISON:  Chest CT - 10/22/2013; chest radiograph - 10/08/2017 FINDINGS: Vascular Findings: Aneurysmal dilatation of the ascending thoracic aorta with measurements as follows. This finding is associated with a type A thoracic aortic dissection beginning at the level of the aortic root extending to the proximal aspect of the aortic arch. Bovine configuration of the aortic arch. The dissection extends to abut the combined origin of the right brachiocephalic and left common carotid arteries without extension into either vessel. Branch vessels of the aortic  arch appear tortuous but widely patent throughout their imaged course. While both the true and false lumens remain opacified, there is significant mass effect upon the true lumen by the larger false lumen. Review of the precontrast images are negative for the presence of an intramural hematoma. No periaortic stranding or contrast extravasation. The thoracic aorta remains aneurysmal at the level of the aortic arch and then tapers to a normal caliber at the level of the proximal descending thoracic aorta. Scattered atherosclerotic plaque within the tortuous but normal caliber descending thoracic aorta, not resulting in a hemodynamically significant stenosis. Cardiomegaly. While incompletely evaluated, the proximal aspects of both the right and left coronary arteries appear to arise from the true lumen in both appear well opacified. Coronary artery calcifications. No pericardial effusion. Although this examination was not tailored for the evaluation the pulmonary arteries, there are no discrete filling defects within the central pulmonary arterial tree to suggest central pulmonary embolism. Borderline enlarged caliber of the main pulmonary artery measuring 34 mm in diameter. ------------------------------------------------------------- Thoracic aortic measurements: Sinotubular junction 52 mm as measured in greatest oblique coronal dimension. Proximal ascending aorta 61 mm as measured in greatest oblique axial dimension at the level of the main pulmonary artery (image 55, series 6) and approximately 59 mm in greatest oblique short axis coronal diameter (coronal image 57, series 9), previously, 42 mm. Aortic arch aorta 45 mm as measured in greatest oblique sagittal dimension, previously, 34 mm. Proximal descending thoracic aorta 33 mm as measured in greatest oblique axial dimension at the level of the main pulmonary artery. Distal descending thoracic aorta 30 mm as measured in greatest oblique axial dimension at the  level of the diaphragmatic hiatus. Review of the MIP images confirms the above findings. ------------------------------------------------------------- Non-Vascular Findings: Mediastinum/Lymph Nodes: No bulky mediastinal, hilar or axillary lymphadenopathy. Lungs/Pleura: Evaluation the pulmonary parenchyma is minimally degraded secondary to patient respiratory artifact. Mild apical predominant centrilobular emphysematous change. There is minimal subpleural interstitial opacities most conspicuous about the posterolateral aspect of the right upper lobe, adjacent to the posterior aspect of the right major fissure, within the dependent portion of the bilateral lung bases as well as the right middle lobe and caudal aspect of the lingula. The central pulmonary airways appear patent. No bronchiectasis. No pleural effusion or pneumothorax. No discrete pulmonary nodules given limitation of the examination. Upper abdomen: Limited early arterial phase evaluation of the upper  abdomen demonstrates mixed calcified and noncalcified atherosclerotic plaque within the splenic artery. Musculoskeletal: No acute or aggressive osseous abnormalities. Moderate scoliotic curvature of the thoracolumbar spine with dominant mid component convex the right. Post right total shoulder replacement, incompletely evaluated. Degenerative change of the left glenohumeral joint, incompletely evaluated. Regional soft tissues appear normal. Normal appearance of the thyroid gland. IMPRESSION: 1. Age-indeterminate, though presumably, chronic type A thoracic aortic dissection beginning at the aortic root extending to the proximal aspect of the aortic arch without extension into the branch vessels of the aortic arch. There is opacification of both the true and false lumens with significant mass effect of the true lumen by the larger false lumen. No intramural hematoma, periaortic stranding or contrast extravasation. 2. Aneurysmal dilatation of the ascending  thoracic aorta and aortic arch. The ascending thoracic aorta measures 61 mm in maximal diameter while the aortic arch measures 45 mm, previously, and 42 and 34 mm respectively. 3. Aortic aneurysm NOS (ICD10-I71.9). 4. Aortic Atherosclerosis (ICD10-I70.0). 5. Emphysema (ICD10-J43.9). Above findings discussed with Dr. Lysle Rubens at 13:24 on 10/23/2017. Electronically Signed   By: Sandi Mariscal M.D.   On: 10/23/2017 13:27     I have independently reviewed the above radiology studies  and reviewed the findings with the patient.   Recent Lab Findings: Lab Results  Component Value Date   WBC 5.4 10/08/2017   HGB 8.8 (L) 10/08/2017   HCT 27.9 (L) 10/08/2017   PLT 172 10/08/2017   GLUCOSE 97 10/08/2017   CHOL 126 07/26/2017   TRIG 30 07/26/2017   HDL 49 07/26/2017   LDLCALC 71 07/26/2017   ALT 24 04/21/2014   AST 30 04/21/2014   NA 135 10/08/2017   K 4.0 10/08/2017   CL 105 10/08/2017   CREATININE 1.24 10/08/2017   BUN 14 10/08/2017   CO2 23 10/08/2017   TSH 4.140 03/12/2013   INR 1.25 10/08/2017   HGBA1C 5.5 07/25/2017     Assessment / Plan:   Patient with a type I aortic dissection without evidence of tamponade that is at least 106 weeks old.  Typically this would be operated on as an acute emergency as the mortality is close to 1 %/hour  involving the aortic root and ascending aorta.  With the patient's extremely poor functional status, debilitated medical condition, underlying dementia he would not be a candidate for any surgical repair of the aorta emergency or otherwise.  His CODE STATUS should be addressed with his family and caregivers. I discussed this in detail with his wife who met him here in the office after the patient was brought from the nursing home.    I  spent 60 minutes with  the patient face to face and greater then 50% of the time was spent in counseling and coordination of care.    Grace Isaac MD      [address] [place] [address] Office  [phone]   Beeper [phone]  11/15/2017 3:39 PM

## 2017-11-15 NOTE — Progress Notes (Deleted)
[address]       [place] [address]             [phone]           [address] [place]       [place] [address]             [phone]        [facility] Medical Record #[id number] Date of Birth: 21-May-1931  Referring: No ref. provider found Primary Care: Georgianne Fick, MD Primary Cardiologist:Traci Mayford Knife, MD  Chief Complaint:    Chief Complaint  Patient presents with  . Thoracic Aortic Aneurysm    new patient consultation, CTA 10/23/17    History of Present Illness:        Current Activity/ Functional Status: {functional status:19517}   Zubrod Score: At the time of surgery this patient's most appropriate activity status/level should be described as: []     0    Normal activity, no symptoms []     1    Restricted in physical strenuous activity but ambulatory, able to do out light work []     2    Ambulatory and capable of self care, unable to do work activities, up and about                 more than 50%  Of the time                            []     3    Only limited self care, in bed greater than 50% of waking hours []     4    Completely disabled, no self care, confined to bed or chair []     5    Moribund  Past Medical History:  Diagnosis Date  . Allergic rhinitis   . Arthritis   . Constipation    takes Miralax daily as needed  . Coronary artery disease    s/p remote IWMI 2004 with PCI of the RCA with BMS and repeat cath 2007 due to recurrent CP and abnormal nuclear stress test and cath showed widely patent RCA stent with 30-50% distal stenosis before the takeoff of a PL and PDA and normal LVF.   [name] Depression with anxiety    takes Clonazepam nightly  . GERD (gastroesophageal reflux disease)    takes Omeprazole and Pepcid daily  . Heart attack ([facility])   . History of bronchitis    pulmonologist is Dr.Wert.  . Hyperlipidemia    takes Atorvastatin daily  . Hypertension    takes Diovan daily  . Joint  pain   . Memory loss   . RLS (restless legs syndrome)   . Unspecified vitamin D deficiency   . Vitamin B 12 deficiency     Past Surgical History:  Procedure Laterality Date  . CARDIAC CATHETERIZATION  2004  . cataract surgery    . COLONOSCOPY    . CORONARY ANGIOPLASTY     1 stent  . ESOPHAGOGASTRODUODENOSCOPY    . HAND SURGERY Bilateral   . HERNIA REPAIR Left    inguinal   . NASAL SINUS SURGERY    . REVERSE SHOULDER ARTHROPLASTY Right 04/29/2014   Procedure: REVERSE TOTAL SHOULDER ARTHROPLASTY ;  Surgeon: Francena Hanly, MD;  Location: [facility];  Service: Orthopedics;  Laterality: Right;  . stents      Social History   Tobacco Use  Smoking Status Former Smoker  . Packs/day: 3.00  . Years: 15.00  . Pack years: 45.00  . Types: Cigarettes  . Last attempt to quit: 02/06/1964  . Years since quitting: 53.8  Smokeless Tobacco Never Used   Social History   Substance and Sexual Activity  Alcohol Use No  . Alcohol/week: 0.0 standard drinks     No Known Allergies  Current Outpatient Medications  Medication Sig Dispense Refill  . aspirin EC 81 MG EC tablet Take 1 tablet (81 mg total) by mouth daily. 90 tablet 4  . atorvastatin (LIPITOR) 20 MG tablet Take 1 tablet (20 mg total) by mouth daily at 6 PM. (Patient taking differently: Take 20 mg by mouth at bedtime. ) 90 tablet 4  . carvedilol (COREG) 3.125 MG tablet Take 1 tablet (3.125 mg total) by mouth 2 (two) times daily with a meal. 180 tablet 4  . chlorpheniramine (ALLERGY) 4 MG tablet Take 4 mg by mouth 2 (two) times daily.     . clopidogrel (PLAVIX) 75 MG tablet Take 1 tablet (75 mg total) by mouth daily. 90 tablet 4  . donepezil (ARICEPT) 10 MG tablet Take 1 tablet (10 mg total) by mouth at bedtime. Start 1/2 tablet daily x 4 weeks then 1 tablet daily (Patient taking differently: Take 10 mg by mouth every morning. ) 30 tablet 3  . escitalopram (LEXAPRO) 5 MG tablet Take 5 mg by mouth every morning.    . fluticasone (FLONASE) 50  MCG/ACT nasal spray Place 1 spray into both nostrils 2 (two) times daily. Reported on 03/07/2015    . guaiFENesin (MUCINEX) 600 MG 12 hr tablet Take 600 mg by mouth 2 (two) times daily.    . memantine (NAMENDA TITRATION PAK) tablet pack 5 mg/day for =1 week; 5 mg twice daily for =1 week; 15 mg/day given in 5 mg and 10 mg separated doses for =1 week; then 10 mg twice daily (Patient taking differently: Take 10 mg by mouth 2 (two) times daily. ) 49 tablet 0  . Multiple Vitamin (MULTIVITAMIN WITH MINERALS) TABS tablet Take 1 tablet by mouth daily.    . nitroGLYCERIN (NITROSTAT) 0.4 MG SL tablet Place 1 tablet (0.4 mg total) under the tongue every 5 (five) minutes x 3 doses as needed for chest pain. 25 tablet 0  . tiotropium (SPIRIVA) 18 MCG inhalation capsule Place 1 capsule (18 mcg total) into inhaler and inhale daily. 30 capsule 0  . Tiotropium Bromide-Olodaterol (STIOLTO RESPIMAT) 2.5-2.5 MCG/ACT AERS Inhale 2 puffs into the lungs at bedtime.    . traZODone (DESYREL) 50 MG tablet Take 50 mg by mouth at bedtime.     No current facility-administered medications for this visit.      (Not in a hospital admission)  Family History  Problem Relation Age of Onset  . Asthma Mother   . Cancer - Prostate Father      Review of Systems:   ROS {Ros - complete:30496}     Cardiac Review of Systems: Y or  [    ]= no  Chest Pain [    ]  Resting SOB [   ] Exertional SOB  [  ]  Orthopnea [  ]   Pedal Edema [   ]    Palpitations [  ] Syncope  [  ]   Presyncope [   ]  General Review of Systems: [Y] = yes [  ]=no Constitional: recent weight change [  ]; anorexia [  ]; fatigue [  ];  nausea [  ]; night sweats [  ]; fever [  ]; or chills [  ]                                                               Dental: Last Dentist visit:   Eye : blurred vision [  ]; diplopia [   ]; vision changes [  ];  Amaurosis fugax[  ]; Resp: cough [  ];  wheezing[  ];  hemoptysis[  ]; shortness of breath[  ]; paroxysmal  nocturnal dyspnea[  ]; dyspnea on exertion[  ]; or orthopnea[  ];  GI:  gallstones[  ], vomiting[  ];  dysphagia[  ]; melena[  ];  hematochezia [  ]; heartburn[  ];   Hx of  Colonoscopy[  ]; GU: kidney stones [  ]; hematuria[  ];   dysuria [  ];  nocturia[  ];  history of     obstruction [  ]; urinary frequency [  ]             Skin: rash, swelling[  ];, hair loss[  ];  peripheral edema[  ];  or itching[  ]; Musculosketetal: myalgias[  ];  joint swelling[  ];  joint erythema[  ];  joint pain[  ];  back pain[  ];  Heme/Lymph: bruising[  ];  bleeding[  ];  anemia[  ];  Neuro: TIA[  ];  headaches[  ];  stroke[  ];  vertigo[  ];  seizures[  ];   paresthesias[  ];  difficulty walking[  ];  Psych:depression[  ]; anxiety[  ];  Endocrine: diabetes[  ];  thyroid dysfunction[  ];            {Dental Care:21289::"Single injection performed as below:"}            {Flue/Pneumonia Vaccination Status:21291}      Physical Exam: BP (!) 110/58 (BP Location: Left Arm, Patient Position: Sitting, Cuff Size: Normal)   Pulse 72   Resp 18   Ht 5\' 8"  (1.727 m)   Wt 70.3 kg   SpO2 100% Comment: RA  BMI 23.57 kg/m    {physical exam:21449}  Diagnostic Studies & Laboratory data:     Recent Radiology Findings:   No results found.   I have independently reviewed the above radiologic studies and discussed with the patient   Recent Lab Findings: Lab Results  Component Value Date   WBC 5.4 10/08/2017   HGB 8.8 (L) 10/08/2017   HCT 27.9 (L) 10/08/2017   PLT 172 10/08/2017   GLUCOSE 97 10/08/2017   CHOL 126 07/26/2017   TRIG 30 07/26/2017   HDL 49 07/26/2017   LDLCALC 71 07/26/2017   ALT 24 04/21/2014   AST 30 04/21/2014   NA 135 10/08/2017   K 4.0 10/08/2017   CL 105 10/08/2017   CREATININE 1.24 10/08/2017   BUN 14 10/08/2017   CO2 23 10/08/2017   TSH 4.140 03/12/2013   INR 1.25 10/08/2017   HGBA1C 5.5 07/25/2017      Assessment / Plan:          I  spent {CHL ONC TIME VISIT -  YQMVH:[id number]} counseling the patient face to face.   Rowe Clack, PA-C 11/15/2017 2:35 PM

## 2017-11-15 NOTE — Patient Instructions (Signed)

## 2017-12-05 DIAGNOSIS — I4891 Unspecified atrial fibrillation: Secondary | ICD-10-CM | POA: Diagnosis not present

## 2017-12-05 DIAGNOSIS — F0390 Unspecified dementia without behavioral disturbance: Secondary | ICD-10-CM | POA: Diagnosis not present

## 2017-12-05 DIAGNOSIS — R2689 Other abnormalities of gait and mobility: Secondary | ICD-10-CM | POA: Diagnosis not present

## 2017-12-05 DIAGNOSIS — I509 Heart failure, unspecified: Secondary | ICD-10-CM | POA: Diagnosis not present

## 2017-12-05 DIAGNOSIS — F322 Major depressive disorder, single episode, severe without psychotic features: Secondary | ICD-10-CM | POA: Diagnosis not present

## 2017-12-05 DIAGNOSIS — J449 Chronic obstructive pulmonary disease, unspecified: Secondary | ICD-10-CM | POA: Diagnosis not present

## 2017-12-05 DIAGNOSIS — D696 Thrombocytopenia, unspecified: Secondary | ICD-10-CM | POA: Diagnosis not present

## 2017-12-05 DIAGNOSIS — I10 Essential (primary) hypertension: Secondary | ICD-10-CM | POA: Diagnosis not present

## 2017-12-24 ENCOUNTER — Inpatient Hospital Stay (HOSPITAL_COMMUNITY)
Admission: EM | Admit: 2017-12-24 | Discharge: 2017-12-26 | DRG: 083 | Disposition: A | Discharge status: Skilled Nursing Facility | Source: Skilled Nursing Facility | Attending: Internal Medicine | Admitting: Internal Medicine

## 2017-12-24 ENCOUNTER — Encounter (HOSPITAL_COMMUNITY): Payer: Self-pay | Admitting: Emergency Medicine

## 2017-12-24 ENCOUNTER — Other Ambulatory Visit: Payer: Self-pay

## 2017-12-24 DIAGNOSIS — Z993 Dependence on wheelchair: Secondary | ICD-10-CM

## 2017-12-24 DIAGNOSIS — E785 Hyperlipidemia, unspecified: Secondary | ICD-10-CM | POA: Diagnosis present

## 2017-12-24 DIAGNOSIS — F0281 Dementia in other diseases classified elsewhere with behavioral disturbance: Secondary | ICD-10-CM | POA: Diagnosis present

## 2017-12-24 DIAGNOSIS — Z79899 Other long term (current) drug therapy: Secondary | ICD-10-CM

## 2017-12-24 DIAGNOSIS — S0531XA Ocular laceration without prolapse or loss of intraocular tissue, right eye, initial encounter: Secondary | ICD-10-CM | POA: Diagnosis not present

## 2017-12-24 DIAGNOSIS — S065X0A Traumatic subdural hemorrhage without loss of consciousness, initial encounter: Secondary | ICD-10-CM | POA: Diagnosis not present

## 2017-12-24 DIAGNOSIS — S065X9A Traumatic subdural hemorrhage with loss of consciousness of unspecified duration, initial encounter: Principal | ICD-10-CM | POA: Diagnosis present

## 2017-12-24 DIAGNOSIS — I252 Old myocardial infarction: Secondary | ICD-10-CM

## 2017-12-24 DIAGNOSIS — N400 Enlarged prostate without lower urinary tract symptoms: Secondary | ICD-10-CM | POA: Diagnosis present

## 2017-12-24 DIAGNOSIS — Z66 Do not resuscitate: Secondary | ICD-10-CM | POA: Diagnosis present

## 2017-12-24 DIAGNOSIS — K219 Gastro-esophageal reflux disease without esophagitis: Secondary | ICD-10-CM | POA: Diagnosis present

## 2017-12-24 DIAGNOSIS — W1830XA Fall on same level, unspecified, initial encounter: Secondary | ICD-10-CM | POA: Diagnosis present

## 2017-12-24 DIAGNOSIS — Z955 Presence of coronary angioplasty implant and graft: Secondary | ICD-10-CM

## 2017-12-24 DIAGNOSIS — G2581 Restless legs syndrome: Secondary | ICD-10-CM | POA: Diagnosis present

## 2017-12-24 DIAGNOSIS — D500 Iron deficiency anemia secondary to blood loss (chronic): Secondary | ICD-10-CM | POA: Diagnosis present

## 2017-12-24 DIAGNOSIS — I10 Essential (primary) hypertension: Secondary | ICD-10-CM | POA: Diagnosis present

## 2017-12-24 DIAGNOSIS — S065XAA Traumatic subdural hemorrhage with loss of consciousness status unknown, initial encounter: Secondary | ICD-10-CM

## 2017-12-24 DIAGNOSIS — Z96611 Presence of right artificial shoulder joint: Secondary | ICD-10-CM | POA: Diagnosis present

## 2017-12-24 DIAGNOSIS — I2510 Atherosclerotic heart disease of native coronary artery without angina pectoris: Secondary | ICD-10-CM | POA: Diagnosis present

## 2017-12-24 DIAGNOSIS — Z7982 Long term (current) use of aspirin: Secondary | ICD-10-CM

## 2017-12-24 DIAGNOSIS — R5381 Other malaise: Secondary | ICD-10-CM | POA: Diagnosis not present

## 2017-12-24 DIAGNOSIS — Z7902 Long term (current) use of antithrombotics/antiplatelets: Secondary | ICD-10-CM

## 2017-12-24 DIAGNOSIS — Z7951 Long term (current) use of inhaled steroids: Secondary | ICD-10-CM

## 2017-12-24 DIAGNOSIS — S0181XA Laceration without foreign body of other part of head, initial encounter: Secondary | ICD-10-CM | POA: Diagnosis present

## 2017-12-24 DIAGNOSIS — J309 Allergic rhinitis, unspecified: Secondary | ICD-10-CM | POA: Diagnosis present

## 2017-12-24 DIAGNOSIS — S0093XA Contusion of unspecified part of head, initial encounter: Secondary | ICD-10-CM | POA: Diagnosis not present

## 2017-12-24 DIAGNOSIS — D696 Thrombocytopenia, unspecified: Secondary | ICD-10-CM | POA: Diagnosis present

## 2017-12-24 DIAGNOSIS — I712 Thoracic aortic aneurysm, without rupture: Secondary | ICD-10-CM | POA: Diagnosis present

## 2017-12-24 DIAGNOSIS — F418 Other specified anxiety disorders: Secondary | ICD-10-CM | POA: Diagnosis present

## 2017-12-24 DIAGNOSIS — F0390 Unspecified dementia without behavioral disturbance: Secondary | ICD-10-CM | POA: Diagnosis present

## 2017-12-24 DIAGNOSIS — W19XXXA Unspecified fall, initial encounter: Secondary | ICD-10-CM | POA: Diagnosis not present

## 2017-12-24 DIAGNOSIS — Z87891 Personal history of nicotine dependence: Secondary | ICD-10-CM

## 2017-12-24 DIAGNOSIS — G309 Alzheimer's disease, unspecified: Secondary | ICD-10-CM | POA: Diagnosis present

## 2017-12-24 MED ORDER — HYDROGEN PEROXIDE 3 % EX SOLN
CUTANEOUS | Status: AC
  Administered 2017-12-24: 23:00:00
  Filled 2017-12-24: qty 473

## 2017-12-24 MED ORDER — LIDOCAINE-EPINEPHRINE 2 %-1:100000 IJ SOLN
20.0000 mL | Freq: Once | INTRAMUSCULAR | Status: AC
  Administered 2017-12-25: 20 mL
  Filled 2017-12-24: qty 20

## 2017-12-24 NOTE — ED Notes (Signed)
ED Provider at bedside. 

## 2017-12-24 NOTE — ED Notes (Signed)
Lidocaine and suture cart at bedside.  

## 2017-12-24 NOTE — ED Provider Notes (Signed)
[facility]-EMERGENCY DEPT Provider Note   CSN: [id number] Arrival date & time: 12/24/17  2300     History   Chief Complaint Chief Complaint  Patient presents with  . Fall    HPI [name] is a 84 y.o. male.  HPI  This is an 84 year old male with a history of dementia, coronary artery disease reportedly on Plavix who presents from his living facility after fall.  Patient had an unwitnessed fall.  He is found with a hematoma and laceration to the right side of the face.  At baseline he is disoriented.  Per EMS, he is at his baseline.  He is unable to provide history.  He cannot tell me how or when he fell.  He denies pain except for the right side of his face.  Level 5 caveat for dementia  Past Medical History:  Diagnosis Date  . Allergic rhinitis   . Arthritis   . Constipation    takes Miralax daily as needed  . Coronary artery disease    s/p remote IWMI 2004 with PCI of the RCA with BMS and repeat cath 2007 due to recurrent CP and abnormal nuclear stress test and cath showed widely patent RCA stent with 30-50% distal stenosis before the takeoff of a PL and PDA and normal LVF.   [name] Depression with anxiety    takes Clonazepam nightly  . GERD (gastroesophageal reflux disease)    takes Omeprazole and Pepcid daily  . Heart attack (HCC)   . History of bronchitis    pulmonologist is Dr.Wert.  . Hyperlipidemia    takes Atorvastatin daily  . Hypertension    takes Diovan daily  . Joint pain   . Memory loss   . RLS (restless legs syndrome)   . Unspecified vitamin D deficiency   . Vitamin B 12 deficiency     Patient Active Problem List   Diagnosis Date Noted  . Subdural hematoma (HCC) 12/25/2017  . History of percutaneous coronary intervention 08/01/2017  . Shingles 08/01/2017  . Goals of care, counseling/discussion   . Palliative care by specialist   . Chest pain 07/25/2017  . Dementia (HCC) 03/07/2015  . Fall (on) (from) other stairs and  steps, sequela 03/07/2015  . S/P shoulder replacement 04/29/2014  . Essential hypertension 03/11/2014  . Mild cognitive impairment 03/12/2013  . Anxiety state 03/12/2013  . CELLULITIS AND ABSCESS OF UPPER ARM AND FOREARM 07/05/2009  . Dyslipidemia 03/21/2007  . Non-ST elevation (NSTEMI) myocardial infarction (HCC) 03/21/2007  . ALLERGIC RHINITIS 03/21/2007  . COUGH 01/14/2007  . CAD (coronary artery disease), native coronary artery 01/13/2007  . CHRONIC RHINITIS 01/13/2007  . COPD pfts pending  01/13/2007  . Esophageal reflux 01/13/2007  . DYSPNEA 01/13/2007    Past Surgical History:  Procedure Laterality Date  . CARDIAC CATHETERIZATION  2004  . cataract surgery    . COLONOSCOPY    . CORONARY ANGIOPLASTY     1 stent  . ESOPHAGOGASTRODUODENOSCOPY    . HAND SURGERY Bilateral   . HERNIA REPAIR Left    inguinal   . NASAL SINUS SURGERY    . REVERSE SHOULDER ARTHROPLASTY Right 04/29/2014   Procedure: REVERSE TOTAL SHOULDER ARTHROPLASTY ;  Surgeon: Francena Hanly, MD;  Location: [facility] OR;  Service: Orthopedics;  Laterality: Right;  . stents          Home Medications    Prior to Admission medications   Medication Sig Start Date End Date Taking? Authorizing Provider  aspirin  EC 81 MG EC tablet Take 1 tablet (81 mg total) by mouth daily. 08/02/17  Yes Georgie Chard D, NP  atorvastatin (LIPITOR) 20 MG tablet Take 1 tablet (20 mg total) by mouth daily at 6 PM. Patient taking differently: Take 20 mg by mouth at bedtime.  08/01/17  Yes Georgie Chard D, NP  carvedilol (COREG) 3.125 MG tablet Take 1 tablet (3.125 mg total) by mouth 2 (two) times daily with a meal. 08/01/17  Yes Georgie Chard D, NP  chlorpheniramine (ALLERGY) 4 MG tablet Take 4 mg by mouth 2 (two) times daily.    Yes [provider]  clopidogrel (PLAVIX) 75 MG tablet Take 1 tablet (75 mg total) by mouth daily. 08/02/17  Yes Georgie Chard D, NP  donepezil (ARICEPT) 10 MG tablet Take 1 tablet (10 mg total) by mouth at  bedtime. Start 1/2 tablet daily x 4 weeks then 1 tablet daily Patient taking differently: Take 10 mg by mouth every morning.  02/19/17  Yes Micki Riley, MD  escitalopram (LEXAPRO) 10 MG tablet Take 10 mg by mouth daily.   Yes [provider]  fluticasone (FLONASE) 50 MCG/ACT nasal spray Place 1 spray into both nostrils 2 (two) times daily. Reported on 03/07/2015   Yes [provider]  guaiFENesin (MUCINEX) 600 MG 12 hr tablet Take 600 mg by mouth 2 (two) times daily.   Yes [provider]  memantine (NAMENDA) 10 MG tablet Take 10 mg by mouth 2 (two) times daily.   Yes [provider]  Multiple Vitamin (MULTIVITAMIN WITH MINERALS) TABS tablet Take 1 tablet by mouth daily.   Yes [provider]  Nutritional Supplements (NUTRITIONAL DRINK) LIQD Take 1 Can by mouth 3 (three) times daily. Mighty Shake   Yes [provider]  tamsulosin (FLOMAX) 0.4 MG CAPS capsule Take 0.4 mg by mouth daily.   Yes [provider]  tiotropium (SPIRIVA) 18 MCG inhalation capsule Place 1 capsule (18 mcg total) into inhaler and inhale daily. 03/31/16  Yes Phelps, Erin O, PA-C  Tiotropium Bromide-Olodaterol (STIOLTO RESPIMAT) 2.5-2.5 MCG/ACT AERS Inhale 2 puffs into the lungs daily.    Yes [provider]  traZODone (DESYREL) 50 MG tablet Take 50 mg by mouth at bedtime. 10/06/17  Yes [provider]  memantine (NAMENDA TITRATION PAK) tablet pack 5 mg/day for =1 week; 5 mg twice daily for =1 week; 15 mg/day given in 5 mg and 10 mg separated doses for =1 week; then 10 mg twice daily Patient not taking: Reported on 12/25/2017 06/17/17   George Hugh, NP  nitroGLYCERIN (NITROSTAT) 0.4 MG SL tablet Place 1 tablet (0.4 mg total) under the tongue every 5 (five) minutes x 3 doses as needed for chest pain. 08/01/17   Filbert Schilder, NP    Family History Family History  Problem Relation Age of Onset  . Asthma Mother   . Cancer - Prostate Father       Social History Social History   Tobacco Use  . Smoking status: Former Smoker    Packs/day: 3.00    Years: 15.00    Pack years: 45.00    Types: Cigarettes    Last attempt to quit: 02/06/1964    Years since quitting: 53.9  . Smokeless tobacco: Never Used  Substance Use Topics  . Alcohol use: No    Alcohol/week: 0.0 standard drinks  . Drug use: No     Allergies   Patient has no known allergies.   Review of Systems  Review of Systems  Unable to perform ROS: Dementia     Physical Exam Updated Vital Signs BP 116/77 (BP Location: Left Arm)   Pulse 93   Temp 97.6 F (36.4 C) (Oral)   Resp 11   SpO2 96%   Physical Exam  Constitutional:  Elderly, nontoxic-appearing  HENT:  Head: Normocephalic.  Jagged 4 cm laceration just lateral to the right eye, oozing, underlying hematoma extending upward into the forehead  Eyes: Pupils are equal, round, and reactive to light. EOM are normal.  Neck: Normal range of motion. Neck supple.  Cardiovascular: Normal rate, regular rhythm and normal heart sounds.  No murmur heard. Pulmonary/Chest: Effort normal and breath sounds normal. No respiratory distress. He has no wheezes.  Abdominal: Soft. There is no tenderness.  Musculoskeletal: He exhibits no edema or deformity.  Normal range of motion of the bilateral hips and knees, no obvious deformities  Neurological: He is alert.  Oriented only to self  Skin: Skin is warm and dry.  No other skin tears noted  Psychiatric: He has a normal mood and affect.  Nursing note and vitals reviewed.    ED Treatments / Results  Labs (all labs ordered are listed, but only abnormal results are displayed) Labs Reviewed  CBC WITH DIFFERENTIAL/PLATELET - Abnormal; Notable for the following components:      Result Value   RBC 3.77 (*)    Hemoglobin 11.2 (*)    HCT 35.7 (*)    RDW 15.8 (*)    Platelets 146 (*)    All other components within normal limits  BASIC METABOLIC PANEL - Abnormal;  Notable for the following components:   Glucose, Bld 113 (*)    GFR calc non Af Amer 57 (*)    All other components within normal limits  PROTIME-INR  URINALYSIS, ROUTINE W REFLEX MICROSCOPIC  TYPE AND SCREEN  PREPARE PLATELET PHERESIS  ABO/RH    EKG EKG Interpretation  Date/Time:  Wednesday December 25 2017 01:13:26 EST Ventricular Rate:  94 PR Interval:    QRS Duration: 105 QT Interval:  382 QTC Calculation: 478 R Axis:   -56 Text Interpretation:  Sinus rhythm Atrial premature complexes Left anterior fascicular block Posterior infarct, old Borderline T abnormalities, inferior leads Confirmed by Ross MarcusHorton, Courtney ([id number]) on 12/25/2017 2:49:14 AM   Radiology Ct Head Wo Contrast  Result Date: 12/25/2017 CLINICAL DATA:  84 y/o M; unwitnessed fall. Laceration to the right eye with hematoma. EXAM: CT HEAD WITHOUT CONTRAST CT CERVICAL SPINE WITHOUT CONTRAST TECHNIQUE: Multidetector CT imaging of the head and cervical spine was performed following the standard protocol without intravenous contrast. Multiplanar CT image reconstructions of the cervical spine were also generated. COMPARISON:  10/13/2017 CT head. 10/14/2017 CT cervical spine. 10/23/2017 CT chest. FINDINGS: CT HEAD FINDINGS Brain: Acute subdural hematoma over the right cerebral convexity measuring up to 21 mm at the level of the anterior sylvian fissure. Associated effect effaces sulci of the right cerebral convexity, partially effaces the right lateral ventricle, and results in 9 mm of right-to-left midline shift of the septum pellucidum. No herniation at this time. No brain parenchymal hemorrhage or stroke. Stable chronic microvascular ischemic changes and volume loss of the brain. Vascular: Calcific atherosclerosis of carotid siphons and the vertebral arteries. No hyperdense vessel identified. Skull: Large right frontal scalp and periorbital soft tissue contusion with small foci of air indicating laceration. No calvarial or  orbital fracture. Sinuses/Orbits: Mild diffuse paranasal sinus mucosal thickening. Postsurgical changes related to chronic ethmoidectomy and maxillary  antrostomy bilaterally. Normal aeration of the mastoid air cells. Debris within the external auditory canals, likely cerumen. No traumatic or inflammatory finding of the orbital compartments. Bilateral intra-ocular lens replacement. Other: None. CT CERVICAL SPINE FINDINGS Alignment: The atlas is rotated rightward on the axis, likely due to head positioning. Straightening of cervical lordosis and mild cervical spine dextrocurvature. No significant listhesis. Skull base and vertebrae: No acute fracture. No primary bone lesion or focal pathologic process. Soft tissues and spinal canal: Calcific atherosclerosis of the carotid bifurcations. Disc levels: Advanced cervical spondylosis with multilevel disc and facet degenerative changes greatest at the C3-C5 levels. Uncovertebral and facet hypertrophy encroaches on the neural foramen at bilateral C3-4, bilateral C4-5, and right C5-6. No high-grade bony canal stenosis. Upper chest: Partially visualized ascending aortic aneurysm. Chronic dissection on prior CT of the chest is not visualized without contrast. Mild emphysema and scarring at the lung apices. Other: Negative. IMPRESSION: CT head: 1. Acute subdural hematoma over right cerebral convexity measuring up to 21 mm. Associated mass effect with 9 mm of right-to-left midline shift. No herniation at this time. 2. Large right frontal scalp and periorbital soft tissue contusion. No calvarial or orbital fracture identified. 3. Stable chronic microvascular ischemic changes and volume loss of the brain. CT CERVICAL SPINE: 1. No acute fracture or dislocation. 2. Advanced cervical spondylosis greatest at C3-C5 levels. Critical Value/emergent results were called by telephone at the time of interpretation on 12/25/2017 at 12:55 am to Dr. Ross Marcus , who verbally acknowledged  these results. Electronically Signed   By: Mitzi Hansen M.D.   On: 12/25/2017 00:58   Ct Cervical Spine Wo Contrast  Result Date: 12/25/2017 CLINICAL DATA:  84 y/o M; unwitnessed fall. Laceration to the right eye with hematoma. EXAM: CT HEAD WITHOUT CONTRAST CT CERVICAL SPINE WITHOUT CONTRAST TECHNIQUE: Multidetector CT imaging of the head and cervical spine was performed following the standard protocol without intravenous contrast. Multiplanar CT image reconstructions of the cervical spine were also generated. COMPARISON:  10/13/2017 CT head. 10/14/2017 CT cervical spine. 10/23/2017 CT chest. FINDINGS: CT HEAD FINDINGS Brain: Acute subdural hematoma over the right cerebral convexity measuring up to 21 mm at the level of the anterior sylvian fissure. Associated effect effaces sulci of the right cerebral convexity, partially effaces the right lateral ventricle, and results in 9 mm of right-to-left midline shift of the septum pellucidum. No herniation at this time. No brain parenchymal hemorrhage or stroke. Stable chronic microvascular ischemic changes and volume loss of the brain. Vascular: Calcific atherosclerosis of carotid siphons and the vertebral arteries. No hyperdense vessel identified. Skull: Large right frontal scalp and periorbital soft tissue contusion with small foci of air indicating laceration. No calvarial or orbital fracture. Sinuses/Orbits: Mild diffuse paranasal sinus mucosal thickening. Postsurgical changes related to chronic ethmoidectomy and maxillary antrostomy bilaterally. Normal aeration of the mastoid air cells. Debris within the external auditory canals, likely cerumen. No traumatic or inflammatory finding of the orbital compartments. Bilateral intra-ocular lens replacement. Other: None. CT CERVICAL SPINE FINDINGS Alignment: The atlas is rotated rightward on the axis, likely due to head positioning. Straightening of cervical lordosis and mild cervical spine dextrocurvature.  No significant listhesis. Skull base and vertebrae: No acute fracture. No primary bone lesion or focal pathologic process. Soft tissues and spinal canal: Calcific atherosclerosis of the carotid bifurcations. Disc levels: Advanced cervical spondylosis with multilevel disc and facet degenerative changes greatest at the C3-C5 levels. Uncovertebral and facet hypertrophy encroaches on the neural foramen at bilateral C3-4, bilateral C4-5,  and right C5-6. No high-grade bony canal stenosis. Upper chest: Partially visualized ascending aortic aneurysm. Chronic dissection on prior CT of the chest is not visualized without contrast. Mild emphysema and scarring at the lung apices. Other: Negative. IMPRESSION: CT head: 1. Acute subdural hematoma over right cerebral convexity measuring up to 21 mm. Associated mass effect with 9 mm of right-to-left midline shift. No herniation at this time. 2. Large right frontal scalp and periorbital soft tissue contusion. No calvarial or orbital fracture identified. 3. Stable chronic microvascular ischemic changes and volume loss of the brain. CT CERVICAL SPINE: 1. No acute fracture or dislocation. 2. Advanced cervical spondylosis greatest at C3-C5 levels. Critical Value/emergent results were called by telephone at the time of interpretation on 12/25/2017 at 12:55 am to Dr. Ross Marcus , who verbally acknowledged these results. Electronically Signed   By: Mitzi Hansen M.D.   On: 12/25/2017 00:58    Procedures Procedures (including critical care time)  CRITICAL CARE Performed by: Shon Baton   Total critical care time: 60 minutes  Critical care time was exclusive of separately billable procedures and treating other patients.  Critical care was necessary to treat or prevent imminent or life-threatening deterioration.  Critical care was time spent personally by me on the following activities: development of treatment plan with patient and/or surrogate as well as  nursing, discussions with consultants, evaluation of patient's response to treatment, examination of patient, obtaining history from patient or surrogate, ordering and performing treatments and interventions, ordering and review of laboratory studies, ordering and review of radiographic studies, pulse oximetry and re-evaluation of patient's condition.   Medications Ordered in ED Medications  levETIRAcetam (KEPPRA) IVPB 500 mg/100 mL premix (has no administration in time range)  hydrogen peroxide 3 % external solution (  Given 12/24/17 2321)  lidocaine-EPINEPHrine (XYLOCAINE W/EPI) 2 %-1:100000 (with pres) injection 20 mL (20 mLs Infiltration Given by Other 12/25/17 0000)  0.9 %  sodium chloride infusion (Manually program via Guardrails IV Fluids) ( Intravenous Stopped 12/25/17 0206)     Initial Impression / Assessment and Plan / ED Course  I have reviewed the triage vital signs and the nursing notes.  Pertinent labs & imaging results that were available during my care of the patient were reviewed by me and considered in my medical decision making (see chart for details).  Clinical Course as of Dec 25 249  Wed Dec 25, 2017  0100 Received a call from radiology.  Patient with significant subdural hematoma.  He is on aspirin and Plavix.  Emergent release platelets were ordered.  He is not significantly somnolent.  Exam is limited because of dementia but he appears to be stable at his baseline.  Neurosurgery was consulted.  Patient was discussed with PA, VIncent.  Given goals of care, high risk patient, and DNR, no surgical intervention recommended.  Recommend repeat CT scan in 12 hours.  Keppra for seizure prophylaxis.  Agree with platelets.  Requesting admission to the hospitalist.  I called the patient's wife to confirm goals of care.  She confirms DNR.  She was updated and I discussed with her that he would likely not be an OR candidate.  She stated understanding.  She will be present in the  morning when she can drive.   [CH]    Clinical Course User Index [CH] Horton, Mayer Masker, MD    Patient presents after an unwitnessed fall.  He has a laceration to the face.  Reportedly at his baseline.  He is  only oriented to himself.  See clinical course above.  CT scan with significant subdural hematoma.  Platelets and Keppra were ordered per neurosurgical request.  Wife was updated.  We will plan for admission to the hospitalist with repeat CT scan in 12 hours.  Neurosurgery to evaluate in the morning.  Recommendations on the chart.  Final Clinical Impressions(s) / ED Diagnoses   Final diagnoses:  SDH (subdural hematoma) (HCC)  Facial laceration, initial encounter    ED Discharge Orders    None       Horton, Mayer Masker, MD 12/25/17 [id number]

## 2017-12-24 NOTE — ED Triage Notes (Addendum)
[facility]. Pt has laceration/hematoma next to right eye. Pt is on blood thinners. Pt has hx dementia. At baseline.

## 2017-12-24 NOTE — ED Notes (Signed)
[facility] Expected date:  Expected time:  Means of arrival:  Comments: EMS-70M Fall/head lac

## 2017-12-25 ENCOUNTER — Inpatient Hospital Stay (HOSPITAL_COMMUNITY)

## 2017-12-25 ENCOUNTER — Encounter (HOSPITAL_COMMUNITY): Payer: Self-pay | Admitting: Radiology

## 2017-12-25 ENCOUNTER — Emergency Department (HOSPITAL_COMMUNITY)

## 2017-12-25 DIAGNOSIS — Z7902 Long term (current) use of antithrombotics/antiplatelets: Secondary | ICD-10-CM | POA: Diagnosis not present

## 2017-12-25 DIAGNOSIS — F0281 Dementia in other diseases classified elsewhere with behavioral disturbance: Secondary | ICD-10-CM | POA: Diagnosis present

## 2017-12-25 DIAGNOSIS — Z96611 Presence of right artificial shoulder joint: Secondary | ICD-10-CM | POA: Diagnosis present

## 2017-12-25 DIAGNOSIS — G2581 Restless legs syndrome: Secondary | ICD-10-CM | POA: Diagnosis present

## 2017-12-25 DIAGNOSIS — F418 Other specified anxiety disorders: Secondary | ICD-10-CM | POA: Diagnosis present

## 2017-12-25 DIAGNOSIS — Z7951 Long term (current) use of inhaled steroids: Secondary | ICD-10-CM | POA: Diagnosis not present

## 2017-12-25 DIAGNOSIS — E785 Hyperlipidemia, unspecified: Secondary | ICD-10-CM | POA: Diagnosis present

## 2017-12-25 DIAGNOSIS — I10 Essential (primary) hypertension: Secondary | ICD-10-CM | POA: Diagnosis present

## 2017-12-25 DIAGNOSIS — D696 Thrombocytopenia, unspecified: Secondary | ICD-10-CM | POA: Diagnosis present

## 2017-12-25 DIAGNOSIS — Z7982 Long term (current) use of aspirin: Secondary | ICD-10-CM | POA: Diagnosis not present

## 2017-12-25 DIAGNOSIS — G300 Alzheimer's disease with early onset: Secondary | ICD-10-CM | POA: Diagnosis not present

## 2017-12-25 DIAGNOSIS — F0280 Dementia in other diseases classified elsewhere without behavioral disturbance: Secondary | ICD-10-CM

## 2017-12-25 DIAGNOSIS — S0083XA Contusion of other part of head, initial encounter: Secondary | ICD-10-CM | POA: Diagnosis not present

## 2017-12-25 DIAGNOSIS — E46 Unspecified protein-calorie malnutrition: Secondary | ICD-10-CM | POA: Diagnosis not present

## 2017-12-25 DIAGNOSIS — S065XAA Traumatic subdural hemorrhage with loss of consciousness status unknown, initial encounter: Secondary | ICD-10-CM | POA: Diagnosis present

## 2017-12-25 DIAGNOSIS — I4891 Unspecified atrial fibrillation: Secondary | ICD-10-CM | POA: Diagnosis not present

## 2017-12-25 DIAGNOSIS — J449 Chronic obstructive pulmonary disease, unspecified: Secondary | ICD-10-CM | POA: Diagnosis not present

## 2017-12-25 DIAGNOSIS — S065X9A Traumatic subdural hemorrhage with loss of consciousness of unspecified duration, initial encounter: Secondary | ICD-10-CM | POA: Diagnosis present

## 2017-12-25 DIAGNOSIS — Z955 Presence of coronary angioplasty implant and graft: Secondary | ICD-10-CM | POA: Diagnosis not present

## 2017-12-25 DIAGNOSIS — I2510 Atherosclerotic heart disease of native coronary artery without angina pectoris: Secondary | ICD-10-CM

## 2017-12-25 DIAGNOSIS — I252 Old myocardial infarction: Secondary | ICD-10-CM | POA: Diagnosis not present

## 2017-12-25 DIAGNOSIS — I712 Thoracic aortic aneurysm, without rupture: Secondary | ICD-10-CM | POA: Diagnosis present

## 2017-12-25 DIAGNOSIS — N400 Enlarged prostate without lower urinary tract symptoms: Secondary | ICD-10-CM | POA: Diagnosis present

## 2017-12-25 DIAGNOSIS — I6200 Nontraumatic subdural hemorrhage, unspecified: Secondary | ICD-10-CM | POA: Diagnosis not present

## 2017-12-25 DIAGNOSIS — F0391 Unspecified dementia with behavioral disturbance: Secondary | ICD-10-CM | POA: Diagnosis not present

## 2017-12-25 DIAGNOSIS — F322 Major depressive disorder, single episode, severe without psychotic features: Secondary | ICD-10-CM | POA: Diagnosis not present

## 2017-12-25 DIAGNOSIS — K219 Gastro-esophageal reflux disease without esophagitis: Secondary | ICD-10-CM

## 2017-12-25 DIAGNOSIS — Z87891 Personal history of nicotine dependence: Secondary | ICD-10-CM | POA: Diagnosis not present

## 2017-12-25 DIAGNOSIS — Z79899 Other long term (current) drug therapy: Secondary | ICD-10-CM | POA: Diagnosis not present

## 2017-12-25 DIAGNOSIS — W1830XA Fall on same level, unspecified, initial encounter: Secondary | ICD-10-CM | POA: Diagnosis present

## 2017-12-25 DIAGNOSIS — D500 Iron deficiency anemia secondary to blood loss (chronic): Secondary | ICD-10-CM | POA: Diagnosis present

## 2017-12-25 DIAGNOSIS — G309 Alzheimer's disease, unspecified: Secondary | ICD-10-CM | POA: Diagnosis present

## 2017-12-25 DIAGNOSIS — S0181XA Laceration without foreign body of other part of head, initial encounter: Secondary | ICD-10-CM | POA: Diagnosis present

## 2017-12-25 DIAGNOSIS — J309 Allergic rhinitis, unspecified: Secondary | ICD-10-CM | POA: Diagnosis present

## 2017-12-25 DIAGNOSIS — R2689 Other abnormalities of gait and mobility: Secondary | ICD-10-CM | POA: Diagnosis not present

## 2017-12-25 DIAGNOSIS — Z66 Do not resuscitate: Secondary | ICD-10-CM | POA: Diagnosis present

## 2017-12-25 LAB — BASIC METABOLIC PANEL
Anion gap: 6 (ref 5–15)
BUN: 17 mg/dL (ref 8–23)
CHLORIDE: 106 mmol/L (ref 98–111)
CO2: 25 mmol/L (ref 22–32)
Calcium: 8.9 mg/dL (ref 8.9–10.3)
Creatinine, Ser: 1.14 mg/dL (ref 0.61–1.24)
GFR calc Af Amer: >60 mL/min (ref >60)
GFR calc non Af Amer: 57 mL/min — ABNORMAL LOW (ref >60)
GLUCOSE: 113 mg/dL — AB (ref 70–99)
Potassium: 4.1 mmol/L (ref 3.5–5.1)
Sodium: 137 mmol/L (ref 135–145)

## 2017-12-25 LAB — CBC WITH DIFFERENTIAL/PLATELET
Abs Immature Granulocytes: 0.02 10*3/uL (ref 0.00–0.07)
Basophils Absolute: 0 10*3/uL (ref 0.0–0.1)
Basophils Relative: 1 %
EOS ABS: 0.2 10*3/uL (ref 0.0–0.5)
Eosinophils Relative: 3 %
HEMATOCRIT: 35.7 % — AB (ref 39.0–52.0)
HEMOGLOBIN: 11.2 g/dL — AB (ref 13.0–17.0)
Immature Granulocytes: 0 %
LYMPHS ABS: 1.2 10*3/uL (ref 0.7–4.0)
LYMPHS PCT: 19 %
MCH: 29.7 pg (ref 26.0–34.0)
MCHC: 31.4 g/dL (ref 30.0–36.0)
MCV: 94.7 fL (ref 80.0–100.0)
MONO ABS: 0.6 10*3/uL (ref 0.1–1.0)
Monocytes Relative: 10 %
Neutro Abs: 4.3 10*3/uL (ref 1.7–7.7)
Neutrophils Relative %: 67 %
Platelets: 146 10*3/uL — ABNORMAL LOW (ref 150–400)
RBC: 3.77 MIL/uL — ABNORMAL LOW (ref 4.22–5.81)
RDW: 15.8 % — ABNORMAL HIGH (ref 11.5–15.5)
WBC: 6.4 10*3/uL (ref 4.0–10.5)
nRBC: 0 % (ref 0.0–0.2)

## 2017-12-25 LAB — TYPE AND SCREEN
ABO/RH(D): O NEG
Antibody Screen: NEGATIVE

## 2017-12-25 LAB — PROTIME-INR
INR: 1.14
Prothrombin Time: 14.5 seconds (ref 11.4–15.2)

## 2017-12-25 LAB — GLUCOSE, CAPILLARY
GLUCOSE-CAPILLARY: 117 mg/dL — AB (ref 70–99)
Glucose-Capillary: 77 mg/dL (ref 70–99)
Glucose-Capillary: 102 mg/dL — ABNORMAL HIGH (ref 70–99)

## 2017-12-25 LAB — MRSA PCR SCREENING: MRSA by PCR: NEGATIVE

## 2017-12-25 LAB — ABO/RH: ABO/RH(D): O NEG

## 2017-12-25 MED ORDER — TAMSULOSIN HCL 0.4 MG PO CAPS
0.4000 mg | ORAL_CAPSULE | Freq: Every day | ORAL | Status: DC
  Administered 2017-12-25 – 2017-12-26 (×2): 0.4 mg via ORAL
  Filled 2017-12-25 (×2): qty 1

## 2017-12-25 MED ORDER — ONDANSETRON HCL 4 MG PO TABS: 4.0000 mg | ORAL_TABLET | Freq: Four times a day (QID) | ORAL | Status: DC | PRN

## 2017-12-25 MED ORDER — INSULIN ASPART 100 UNIT/ML ~~LOC~~ SOLN: 0.0000 [IU] | Freq: Three times a day (TID) | SUBCUTANEOUS | Status: DC

## 2017-12-25 MED ORDER — DEXTROSE-NACL 5-0.45 % IV SOLN
INTRAVENOUS | Status: AC
  Administered 2017-12-25: 10:00:00 via INTRAVENOUS

## 2017-12-25 MED ORDER — CHLORHEXIDINE GLUCONATE 0.12 % MT SOLN
15.0000 mL | Freq: Two times a day (BID) | OROMUCOSAL | Status: DC
  Administered 2017-12-25 – 2017-12-26 (×2): 15 mL via OROMUCOSAL
  Filled 2017-12-25: qty 15

## 2017-12-25 MED ORDER — ONDANSETRON HCL 4 MG/2ML IJ SOLN: 4.0000 mg | Freq: Four times a day (QID) | INTRAMUSCULAR | Status: DC | PRN

## 2017-12-25 MED ORDER — LEVETIRACETAM IN NACL 500 MG/100ML IV SOLN
500.0000 mg | Freq: Once | INTRAVENOUS | Status: AC
  Administered 2017-12-25: 500 mg via INTRAVENOUS
  Filled 2017-12-25: qty 100

## 2017-12-25 MED ORDER — BISACODYL 5 MG PO TBEC: 5.0000 mg | DELAYED_RELEASE_TABLET | Freq: Every day | ORAL | Status: DC | PRN

## 2017-12-25 MED ORDER — SODIUM CHLORIDE 0.9% IV SOLUTION
Freq: Once | INTRAVENOUS | Status: AC
  Administered 2017-12-25: 02:00:00 via INTRAVENOUS

## 2017-12-25 MED ORDER — SENNOSIDES-DOCUSATE SODIUM 8.6-50 MG PO TABS: 1.0000 | ORAL_TABLET | Freq: Every evening | ORAL | Status: DC | PRN

## 2017-12-25 MED ORDER — LEVETIRACETAM IN NACL 500 MG/100ML IV SOLN
500.0000 mg | Freq: Two times a day (BID) | INTRAVENOUS | Status: DC
  Administered 2017-12-25 – 2017-12-26 (×3): 500 mg via INTRAVENOUS
  Filled 2017-12-25 (×3): qty 100

## 2017-12-25 MED ORDER — FLEET ENEMA 7-19 GM/118ML RE ENEM: 1.0000 | ENEMA | Freq: Once | RECTAL | Status: DC | PRN

## 2017-12-25 MED ORDER — HALOPERIDOL LACTATE 5 MG/ML IJ SOLN
2.5000 mg | Freq: Four times a day (QID) | INTRAMUSCULAR | Status: DC | PRN
  Administered 2017-12-25 – 2017-12-26 (×2): 2.5 mg via INTRAVENOUS
  Filled 2017-12-25 (×3): qty 1

## 2017-12-25 MED ORDER — ORAL CARE MOUTH RINSE: 15.0000 mL | Freq: Two times a day (BID) | OROMUCOSAL | Status: DC

## 2017-12-25 NOTE — Progress Notes (Signed)
Social Work received consult and is aware of patient. [facility]. The hospice team is following for disposition needs.   Social work will continue to follow as well.   Vivi BarrackNicole Quintina Hakeem, Alexander MtLCSW, MSW Clinical Social Worker  [phone] 12/25/2017  12:12 PM

## 2017-12-25 NOTE — Progress Notes (Signed)
[facility] called [facility] Wilkie [facility]Horton[facility]   [name] is an 84 year old male with history of dementia, CAD, COPD, recent NSTEMI (June 19) who presents after a fall at nursing facility where he resides. He is on plavix and aspirin since recent NSTEMI. He has been given platelets by EDP.  He underwent a head CT which is significant for a right cerebral convexity SDH with max thickness 21mm. There is associated mass effect with approx 9mm MLS.  Fortunately, despite the size of the SDH, he remains at neurologic baseline.  Because of his plavix and ASA use, he it at high risk for worsening SDH and ultimately declining neuro status.  Given age and comorbidities, it would not be recommended that he undergo surgical intervention should his neurological status change. Would rec he be transitioned to comfort care. Dr Wilkie AyeHorton on phone discussing goals of care with wife.   - Rec admission for monitoring. [facility] - Keppra 500mg  BID x7days for seizure prophylaxis - Frequent neuro checks - Can repeat head CT later today (12 hours) for monitoring, although will not change NS plan of care  - D/C Plavix and ASA - has been given platelets by EDP

## 2017-12-25 NOTE — ED Notes (Signed)
ED TO INPATIENT HANDOFF REPORT  Name/Age/Gender [name] 84 y.o. male  Code Status    Code Status Orders  (From admission, onward)         Start     Ordered   12/25/17 0755  Do not attempt resuscitation (DNR)  Continuous    Question Answer Comment  In the event of cardiac or respiratory ARREST Do not call a "code blue"   In the event of cardiac or respiratory ARREST Do not perform Intubation, CPR, defibrillation or ACLS   In the event of cardiac or respiratory ARREST Use medication by any route, position, wound care, and other measures to relive pain and suffering. May use oxygen, suction and manual treatment of airway obstruction as needed for comfort.      12/25/17 0757        Code Status History    Date Active Date Inactive Code Status Order ID Comments User Context   12/25/2017 0144 12/25/2017 0757 DNR [id number]  Merryl Hacker, MD ED   07/25/2017 1553 08/01/2017 2004 Full Code [id number]  Cheryln Manly, NP Inpatient   04/29/2014 1208 05/03/2014 1820 Full Code [id number]  Marcellus Scott Inpatient    Advance Directive Documentation     Most Recent Value  Type of Advance Directive  Healthcare Power of [place], Out of facility DNR (pink MOST or yellow form)  Pre-existing out of facility DNR order (yellow form or pink MOST form)  Yellow form placed in chart (order not valid for inpatient use)  "MOST" Form in Place?  -      Home/SNF/Other Home  Chief Complaint Fall  Level of Care/Admitting Diagnosis ED Disposition    ED Disposition Condition [place]: [place] [[id number]]  Level of Care: Stepdown [14]  Admit to SDU based on following criteria: Hemodynamic compromise or significant risk of instability:  Patient requiring short term acute titration and management of vasoactive drips, and invasive monitoring (i.e., CVP and Arterial line).  Diagnosis: Subdural hematoma [facility]) [[id number]]  Admitting Physician: Gerlean Ren [facility] [[id number]]  Attending Physician: Gerlean Ren [facility] [[id number]]  Estimated length of stay: past midnight tomorrow  Certification:: I certify this patient will need inpatient services for at least 2 midnights  PT Class (Do Not Modify): Inpatient [101]  PT Acc Code (Do Not Modify): Private [1]       Medical History Past Medical History:  Diagnosis Date  . Allergic rhinitis   . Arthritis   . Constipation    takes Miralax daily as needed  . Coronary artery disease    s/p remote IWMI 2004 with PCI of the RCA with BMS and repeat cath 2007 due to recurrent CP and abnormal nuclear stress test and cath showed widely patent RCA stent with 30-50% distal stenosis before the takeoff of a PL and PDA and normal LVF.   [name] Depression with anxiety    takes Clonazepam nightly  . GERD (gastroesophageal reflux disease)    takes Omeprazole and Pepcid daily  . Heart attack ([place])   . History of bronchitis    pulmonologist is Dr.Wert.  . Hyperlipidemia    takes Atorvastatin daily  . Hypertension    takes Diovan daily  . Joint pain   . Memory loss   . RLS (restless legs syndrome)   . Unspecified vitamin D deficiency   . Vitamin B 12 deficiency     Allergies No Known Allergies  IV Location/Drains/Wounds Patient Lines/Drains/Airways Status  Active Line/Drains/Airways    Name:   Placement date:   Placement time:   Site:   Days:   Peripheral IV 12/25/17 Right Antecubital   12/25/17    0111    Antecubital   less than 1          Labs/Imaging Results for orders placed or performed during the hospital encounter of 12/24/17 (from the past 48 hour(s))  CBC with Differential     Status: Abnormal   Collection Time: 12/25/17  1:17 AM  Result Value Ref Range   WBC 6.4 4.0 - 10.5 K/uL   RBC 3.77 (L) 4.22 - 5.81 MIL/uL   Hemoglobin 11.2 (L) 13.0 - 17.0 g/dL   HCT 35.7 (L) 39.0 - 52.0 %   MCV 94.7 80.0 - 100.0 fL   MCH 29.7 26.0 - 34.0 pg   MCHC 31.4 30.0 - 36.0 g/dL   RDW 15.8 (H) 11.5  - 15.5 %   Platelets 146 (L) 150 - 400 K/uL   nRBC 0.0 0.0 - 0.2 %   Neutrophils Relative % 67 %   Neutro Abs 4.3 1.7 - 7.7 K/uL   Lymphocytes Relative 19 %   Lymphs Abs 1.2 0.7 - 4.0 K/uL   Monocytes Relative 10 %   Monocytes Absolute 0.6 0.1 - 1.0 K/uL   Eosinophils Relative 3 %   Eosinophils Absolute 0.2 0.0 - 0.5 K/uL   Basophils Relative 1 %   Basophils Absolute 0.0 0.0 - 0.1 K/uL   Immature Granulocytes 0 %   Abs Immature Granulocytes 0.02 0.00 - 0.07 K/uL    Comment: Performed at [facility], [place] [address], [place], [place] [address]  Basic metabolic panel     Status: Abnormal   Collection Time: 12/25/17  1:17 AM  Result Value Ref Range   Sodium 137 135 - 145 mmol/L   Potassium 4.1 3.5 - 5.1 mmol/L   Chloride 106 98 - 111 mmol/L   CO2 25 22 - 32 mmol/L   Glucose, Bld 113 (H) 70 - 99 mg/dL   BUN 17 8 - 23 mg/dL   Creatinine, Ser 1.14 0.61 - 1.24 mg/dL   Calcium 8.9 8.9 - 10.3 mg/dL   GFR calc non Af Amer 57 (L) >60 mL/min   GFR calc Af Amer >60 >60 mL/min    Comment: (NOTE) The eGFR has been calculated using the CKD EPI equation. This calculation has not been validated in all clinical situations. eGFR's persistently <60 mL/min signify possible Chronic Kidney Disease.    Anion gap 6 5 - 15    Comment: Performed at [facility], [place] [address], [place], [place] [address]  Protime-INR     Status: None   Collection Time: 12/25/17  1:17 AM  Result Value Ref Range   Prothrombin Time 14.5 11.4 - 15.2 seconds   INR 1.14     Comment: Performed at [facility], [place] [address], [place], [place] [address]  Type and screen [place]     Status: None   Collection Time: 12/25/17  1:17 AM  Result Value Ref Range   ABO/RH(D) O NEG    Antibody Screen NEG    Sample Expiration      12/28/2017 Performed at [facility], [place] [address], [place], [place] [address]   Prepare Pheresed  Platelets     Status: None (Preliminary result)   Collection Time: 12/25/17  1:17 AM  Result Value Ref Range  Unit Number [id number]    Blood Component Type PLTP LR1 PAS    Unit division 00    Status of Unit ISSUED    Transfusion Status OK TO TRANSFUSE    Unit tag comment      VERBAL ORDERS PER DR HORTON Performed at [facility] [address], [place], [place] [address]   ABO/Rh     Status: None   Collection Time: 12/25/17  1:18 AM  Result Value Ref Range   ABO/RH(D)      Jenetta Downer NEG Performed at [facility] [address] [place], [place] [address]    Ct Head Wo Contrast  Result Date: 12/25/2017 CLINICAL DATA:  84 y/o M; unwitnessed fall. Laceration to the right eye with hematoma. EXAM: CT HEAD WITHOUT CONTRAST CT CERVICAL SPINE WITHOUT CONTRAST TECHNIQUE: Multidetector CT imaging of the head and cervical spine was performed following the standard protocol without intravenous contrast. Multiplanar CT image reconstructions of the cervical spine were also generated. COMPARISON:  10/13/2017 CT head. 10/14/2017 CT cervical spine. 10/23/2017 CT chest. FINDINGS: CT HEAD FINDINGS Brain: Acute subdural hematoma over the right cerebral convexity measuring up to 21 mm at the level of the anterior sylvian fissure. Associated effect effaces sulci of the right cerebral convexity, partially effaces the right lateral ventricle, and results in 9 mm of right-to-left midline shift of the septum pellucidum. No herniation at this time. No brain parenchymal hemorrhage or stroke. Stable chronic microvascular ischemic changes and volume loss of the brain. Vascular: Calcific atherosclerosis of carotid siphons and the vertebral arteries. No hyperdense vessel identified. Skull: Large right frontal scalp and periorbital soft tissue contusion with small foci of air indicating laceration. No calvarial or orbital fracture. Sinuses/Orbits: Mild diffuse paranasal sinus mucosal  thickening. Postsurgical changes related to chronic ethmoidectomy and maxillary antrostomy bilaterally. Normal aeration of the mastoid air cells. Debris within the external auditory canals, likely cerumen. No traumatic or inflammatory finding of the orbital compartments. Bilateral intra-ocular lens replacement. Other: None. CT CERVICAL SPINE FINDINGS Alignment: The atlas is rotated rightward on the axis, likely due to head positioning. Straightening of cervical lordosis and mild cervical spine dextrocurvature. No significant listhesis. Skull base and vertebrae: No acute fracture. No primary bone lesion or focal pathologic process. Soft tissues and spinal canal: Calcific atherosclerosis of the carotid bifurcations. Disc levels: Advanced cervical spondylosis with multilevel disc and facet degenerative changes greatest at the C3-C5 levels. Uncovertebral and facet hypertrophy encroaches on the neural foramen at bilateral C3-4, bilateral C4-5, and right C5-6. No high-grade bony canal stenosis. Upper chest: Partially visualized ascending aortic aneurysm. Chronic dissection on prior CT of the chest is not visualized without contrast. Mild emphysema and scarring at the lung apices. Other: Negative. IMPRESSION: CT head: 1. Acute subdural hematoma over right cerebral convexity measuring up to 21 mm. Associated mass effect with 9 mm of right-to-left midline shift. No herniation at this time. 2. Large right frontal scalp and periorbital soft tissue contusion. No calvarial or orbital fracture identified. 3. Stable chronic microvascular ischemic changes and volume loss of the brain. CT CERVICAL SPINE: 1. No acute fracture or dislocation. 2. Advanced cervical spondylosis greatest at C3-C5 levels. Critical Value/emergent results were called by telephone at the time of interpretation on 12/25/2017 at 12:55 am to Dr. Thayer Jew , who verbally acknowledged these results. Electronically Signed   By: Kristine Garbe M.D.    On: 12/25/2017 00:58   Ct Cervical Spine Wo Contrast  Result Date: 12/25/2017 CLINICAL  DATA:  84 y/o M; unwitnessed fall. Laceration to the right eye with hematoma. EXAM: CT HEAD WITHOUT CONTRAST CT CERVICAL SPINE WITHOUT CONTRAST TECHNIQUE: Multidetector CT imaging of the head and cervical spine was performed following the standard protocol without intravenous contrast. Multiplanar CT image reconstructions of the cervical spine were also generated. COMPARISON:  10/13/2017 CT head. 10/14/2017 CT cervical spine. 10/23/2017 CT chest. FINDINGS: CT HEAD FINDINGS Brain: Acute subdural hematoma over the right cerebral convexity measuring up to 21 mm at the level of the anterior sylvian fissure. Associated effect effaces sulci of the right cerebral convexity, partially effaces the right lateral ventricle, and results in 9 mm of right-to-left midline shift of the septum pellucidum. No herniation at this time. No brain parenchymal hemorrhage or stroke. Stable chronic microvascular ischemic changes and volume loss of the brain. Vascular: Calcific atherosclerosis of carotid siphons and the vertebral arteries. No hyperdense vessel identified. Skull: Large right frontal scalp and periorbital soft tissue contusion with small foci of air indicating laceration. No calvarial or orbital fracture. Sinuses/Orbits: Mild diffuse paranasal sinus mucosal thickening. Postsurgical changes related to chronic ethmoidectomy and maxillary antrostomy bilaterally. Normal aeration of the mastoid air cells. Debris within the external auditory canals, likely cerumen. No traumatic or inflammatory finding of the orbital compartments. Bilateral intra-ocular lens replacement. Other: None. CT CERVICAL SPINE FINDINGS Alignment: The atlas is rotated rightward on the axis, likely due to head positioning. Straightening of cervical lordosis and mild cervical spine dextrocurvature. No significant listhesis. Skull base and vertebrae: No acute fracture. No  primary bone lesion or focal pathologic process. Soft tissues and spinal canal: Calcific atherosclerosis of the carotid bifurcations. Disc levels: Advanced cervical spondylosis with multilevel disc and facet degenerative changes greatest at the C3-C5 levels. Uncovertebral and facet hypertrophy encroaches on the neural foramen at bilateral C3-4, bilateral C4-5, and right C5-6. No high-grade bony canal stenosis. Upper chest: Partially visualized ascending aortic aneurysm. Chronic dissection on prior CT of the chest is not visualized without contrast. Mild emphysema and scarring at the lung apices. Other: Negative. IMPRESSION: CT head: 1. Acute subdural hematoma over right cerebral convexity measuring up to 21 mm. Associated mass effect with 9 mm of right-to-left midline shift. No herniation at this time. 2. Large right frontal scalp and periorbital soft tissue contusion. No calvarial or orbital fracture identified. 3. Stable chronic microvascular ischemic changes and volume loss of the brain. CT CERVICAL SPINE: 1. No acute fracture or dislocation. 2. Advanced cervical spondylosis greatest at C3-C5 levels. Critical Value/emergent results were called by telephone at the time of interpretation on 12/25/2017 at 12:55 am to Dr. Thayer Jew , who verbally acknowledged these results. Electronically Signed   By: Kristine Garbe M.D.   On: 12/25/2017 00:58    Pending Labs Unresulted Labs (From admission, onward)    Start     Ordered   12/26/17 0500  Comprehensive metabolic panel  Tomorrow morning,   R     12/25/17 0757   12/26/17 0500  CBC  Tomorrow morning,   R     12/25/17 0757   12/26/17 0500  Protime-INR  Tomorrow morning,   R     12/25/17 0757   12/26/17 0500  APTT  Tomorrow morning,   R     12/25/17 0757   12/24/17 2329  Urinalysis, Routine w reflex microscopic  Once,   R     12/24/17 2328          Vitals/Pain Today's Vitals   12/25/17 0530 12/25/17 0600  12/25/17 0630 12/25/17 0800   BP: 127/76 116/84 137/86 129/90  Pulse: 93 91 89 86  Resp: '17 13 15 19  ' Temp:      TempSrc:      SpO2: 100% 100% 98% 98%    Isolation Precautions No active isolations  Medications Medications  tamsulosin (FLOMAX) capsule 0.4 mg (has no administration in time range)  levETIRAcetam (KEPPRA) IVPB 500 mg/100 mL premix (has no administration in time range)  dextrose 5 %-0.45 % sodium chloride infusion (has no administration in time range)  senna-docusate (Senokot-S) tablet 1 tablet (has no administration in time range)  bisacodyl (DULCOLAX) EC tablet 5 mg (has no administration in time range)  sodium phosphate (FLEET) 7-19 GM/118ML enema 1 enema (has no administration in time range)  ondansetron (ZOFRAN) tablet 4 mg (has no administration in time range)    Or  ondansetron (ZOFRAN) injection 4 mg (has no administration in time range)  insulin aspart (novoLOG) injection 0-9 Units (has no administration in time range)  hydrogen peroxide 3 % external solution (  Given 12/24/17 2321)  lidocaine-EPINEPHrine (XYLOCAINE W/EPI) 2 %-1:100000 (with pres) injection 20 mL (20 mLs Infiltration Given by Other 12/25/17 0000)  0.9 %  sodium chloride infusion (Manually program via Guardrails IV Fluids) ( Intravenous Stopped 12/25/17 0206)  levETIRAcetam (KEPPRA) IVPB 500 mg/100 mL premix (0 mg Intravenous Stopped 12/25/17 0556)    Mobility non-ambulatory

## 2017-12-25 NOTE — ED Notes (Signed)
Patient transported to CT 

## 2017-12-25 NOTE — ED Notes (Signed)
ED TO INPATIENT HANDOFF REPORT  Name/Age/Gender [name] 84 y.o. male  Code Status    Code Status Orders  (From admission, onward)         Start     Ordered   12/25/17 0145  Do not attempt resuscitation/DNR  Continuous    Question Answer Comment  In the event of cardiac or respiratory ARREST Do not call a "code blue"   In the event of cardiac or respiratory ARREST Do not perform Intubation, CPR, defibrillation or ACLS   In the event of cardiac or respiratory ARREST Use medication by any route, position, wound care, and other measures to relive pain and suffering. May use oxygen, suction and manual treatment of airway obstruction as needed for comfort.      12/25/17 0144        Code Status History    Date Active Date Inactive Code Status Order ID Comments User Context   07/25/2017 1553 08/01/2017 2004 Full Code [id number]  Cheryln Manly, NP Inpatient   04/29/2014 1208 05/03/2014 1820 Full Code [id number]  Jenetta Loges, PA-C Inpatient    Advance Directive Documentation     Most Recent Value  Type of Advance Directive  Healthcare Power of [place], Out of facility DNR (pink MOST or yellow form)  Pre-existing out of facility DNR order (yellow form or pink MOST form)  Yellow form placed in chart (order not valid for inpatient use)  "MOST" Form in Place?  -      Home/SNF/Other Nursing Home  Chief Complaint Fall  Level of Care/Admitting Diagnosis ED Disposition    ED Disposition Condition Comment   Admit  The patient appears reasonably stabilized for admission considering the current resources, flow, and capabilities available in the ED at this time, and I doubt any other [facility] requiring further screening and/or treatment in the ED prior to admission is  present.       Medical History Past Medical History:  Diagnosis Date  . Allergic rhinitis   . Arthritis   . Constipation    takes Miralax daily as needed  . Coronary artery disease    s/p remote IWMI 2004  with PCI of the RCA with BMS and repeat cath 2007 due to recurrent CP and abnormal nuclear stress test and cath showed widely patent RCA stent with 30-50% distal stenosis before the takeoff of a PL and PDA and normal LVF.   [name] Depression with anxiety    takes Clonazepam nightly  . GERD (gastroesophageal reflux disease)    takes Omeprazole and Pepcid daily  . Heart attack ([place])   . History of bronchitis    pulmonologist is Dr.Wert.  . Hyperlipidemia    takes Atorvastatin daily  . Hypertension    takes Diovan daily  . Joint pain   . Memory loss   . RLS (restless legs syndrome)   . Unspecified vitamin D deficiency   . Vitamin B 12 deficiency     Allergies No Known Allergies  IV Location/Drains/Wounds Patient Lines/Drains/Airways Status   Active Line/Drains/Airways    Name:   Placement date:   Placement time:   Site:   Days:   Peripheral IV 12/25/17 Right Antecubital   12/25/17    0111    Antecubital   less than 1          Labs/Imaging Results for orders placed or performed during the hospital encounter of 12/24/17 (from the past 48 hour(s))  CBC with Differential     Status:  Abnormal   Collection Time: 12/25/17  1:17 AM  Result Value Ref Range   WBC 6.4 4.0 - 10.5 K/uL   RBC 3.77 (L) 4.22 - 5.81 MIL/uL   Hemoglobin 11.2 (L) 13.0 - 17.0 g/dL   HCT 35.7 (L) 39.0 - 52.0 %   MCV 94.7 80.0 - 100.0 fL   MCH 29.7 26.0 - 34.0 pg   MCHC 31.4 30.0 - 36.0 g/dL   RDW 15.8 (H) 11.5 - 15.5 %   Platelets 146 (L) 150 - 400 K/uL   nRBC 0.0 0.0 - 0.2 %   Neutrophils Relative % 67 %   Neutro Abs 4.3 1.7 - 7.7 K/uL   Lymphocytes Relative 19 %   Lymphs Abs 1.2 0.7 - 4.0 K/uL   Monocytes Relative 10 %   Monocytes Absolute 0.6 0.1 - 1.0 K/uL   Eosinophils Relative 3 %   Eosinophils Absolute 0.2 0.0 - 0.5 K/uL   Basophils Relative 1 %   Basophils Absolute 0.0 0.0 - 0.1 K/uL   Immature Granulocytes 0 %   Abs Immature Granulocytes 0.02 0.00 - 0.07 K/uL    Comment: Performed at [facility], [place] [address] [place], [place] [address]  Basic metabolic panel     Status: Abnormal   Collection Time: 12/25/17  1:17 AM  Result Value Ref Range   Sodium 137 135 - 145 mmol/L   Potassium 4.1 3.5 - 5.1 mmol/L   Chloride 106 98 - 111 mmol/L   CO2 25 22 - 32 mmol/L   Glucose, Bld 113 (H) 70 - 99 mg/dL   BUN 17 8 - 23 mg/dL   Creatinine, Ser 1.14 0.61 - 1.24 mg/dL   Calcium 8.9 8.9 - 10.3 mg/dL   GFR calc non Af Amer 57 (L) >60 mL/min   GFR calc Af Amer >60 >60 mL/min    Comment: (NOTE) The eGFR has been calculated using the CKD EPI equation. This calculation has not been validated in all clinical situations. eGFR's persistently <60 mL/min signify possible Chronic Kidney Disease.    Anion gap 6 5 - 15    Comment: Performed at [facility], [place] [address] [place], [place] [address]  Protime-INR     Status: None   Collection Time: 12/25/17  1:17 AM  Result Value Ref Range   Prothrombin Time 14.5 11.4 - 15.2 seconds   INR 1.14     Comment: Performed at [facility], [place] [address] [place], [place] [address]  Type and screen [place]     Status: None   Collection Time: 12/25/17  1:17 AM  Result Value Ref Range   ABO/RH(D) O NEG    Antibody Screen NEG    Sample Expiration      12/28/2017 Performed at [facility], [place] [address] [place], [place] [address]   Prepare Pheresed Platelets     Status: None (Preliminary result)   Collection Time: 12/25/17  1:17 AM  Result Value Ref Range   Unit Number [id number]    Blood Component Type PLTP LR1 PAS    Unit division 00    Status of Unit ISSUED    Transfusion Status OK TO TRANSFUSE    Unit tag comment      VERBAL ORDERS PER DR Dina Rich Performed at [facility] [address], [place], [place] [address]   ABO/Rh     Status: None (Preliminary result)   Collection Time: 12/25/17  1:18 AM  Result Value Ref  Range   ABO/RH(D)      O NEG Performed at [facility] [address], [place], [place] [address]    Ct Head Wo Contrast  Result Date: 12/25/2017 CLINICAL DATA:  84 y/o M; unwitnessed fall. Laceration to the right eye with hematoma. EXAM: CT HEAD WITHOUT CONTRAST CT CERVICAL SPINE WITHOUT CONTRAST TECHNIQUE: Multidetector CT imaging of the head and cervical spine was performed following the standard protocol without intravenous contrast. Multiplanar CT image reconstructions of the cervical spine were also generated. COMPARISON:  10/13/2017 CT head. 10/14/2017 CT cervical spine. 10/23/2017 CT chest. FINDINGS: CT HEAD FINDINGS Brain: Acute subdural hematoma over the right cerebral convexity measuring up to 21 mm at the level of the anterior sylvian fissure. Associated effect effaces sulci of the right cerebral convexity, partially effaces the right lateral ventricle, and results in 9 mm of right-to-left midline shift of the septum pellucidum. No herniation at this time. No brain parenchymal hemorrhage or stroke. Stable chronic microvascular ischemic changes and volume loss of the brain. Vascular: Calcific atherosclerosis of carotid siphons and the vertebral arteries. No hyperdense vessel identified. Skull: Large right frontal scalp and periorbital soft tissue contusion with small foci of air indicating laceration. No calvarial or orbital fracture. Sinuses/Orbits: Mild diffuse paranasal sinus mucosal thickening. Postsurgical changes related to chronic ethmoidectomy and maxillary antrostomy bilaterally. Normal aeration of the mastoid air cells. Debris within the external auditory canals, likely cerumen. No traumatic or inflammatory finding of the orbital compartments. Bilateral intra-ocular lens replacement. Other: None. CT CERVICAL SPINE FINDINGS Alignment: The atlas is rotated rightward on the axis, likely due to head positioning. Straightening of cervical lordosis and mild cervical spine  dextrocurvature. No significant listhesis. Skull base and vertebrae: No acute fracture. No primary bone lesion or focal pathologic process. Soft tissues and spinal canal: Calcific atherosclerosis of the carotid bifurcations. Disc levels: Advanced cervical spondylosis with multilevel disc and facet degenerative changes greatest at the C3-C5 levels. Uncovertebral and facet hypertrophy encroaches on the neural foramen at bilateral C3-4, bilateral C4-5, and right C5-6. No high-grade bony canal stenosis. Upper chest: Partially visualized ascending aortic aneurysm. Chronic dissection on prior CT of the chest is not visualized without contrast. Mild emphysema and scarring at the lung apices. Other: Negative. IMPRESSION: CT head: 1. Acute subdural hematoma over right cerebral convexity measuring up to 21 mm. Associated mass effect with 9 mm of right-to-left midline shift. No herniation at this time. 2. Large right frontal scalp and periorbital soft tissue contusion. No calvarial or orbital fracture identified. 3. Stable chronic microvascular ischemic changes and volume loss of the brain. CT CERVICAL SPINE: 1. No acute fracture or dislocation. 2. Advanced cervical spondylosis greatest at C3-C5 levels. Critical Value/emergent results were called by telephone at the time of interpretation on 12/25/2017 at 12:55 am to Dr. Thayer Jew , who verbally acknowledged these results. Electronically Signed   By: Kristine Garbe M.D.   On: 12/25/2017 00:58   Ct Cervical Spine Wo Contrast  Result Date: 12/25/2017 CLINICAL DATA:  84 y/o M; unwitnessed fall. Laceration to the right eye with hematoma. EXAM: CT HEAD WITHOUT CONTRAST CT CERVICAL SPINE WITHOUT CONTRAST TECHNIQUE: Multidetector CT imaging of the head and cervical spine was performed following the standard protocol without intravenous contrast. Multiplanar CT image reconstructions of the cervical spine were also generated. COMPARISON:  10/13/2017 CT head.  10/14/2017 CT cervical spine. 10/23/2017 CT chest. FINDINGS: CT HEAD FINDINGS Brain: Acute subdural hematoma over the  right cerebral convexity measuring up to 21 mm at the level of the anterior sylvian fissure. Associated effect effaces sulci of the right cerebral convexity, partially effaces the right lateral ventricle, and results in 9 mm of right-to-left midline shift of the septum pellucidum. No herniation at this time. No brain parenchymal hemorrhage or stroke. Stable chronic microvascular ischemic changes and volume loss of the brain. Vascular: Calcific atherosclerosis of carotid siphons and the vertebral arteries. No hyperdense vessel identified. Skull: Large right frontal scalp and periorbital soft tissue contusion with small foci of air indicating laceration. No calvarial or orbital fracture. Sinuses/Orbits: Mild diffuse paranasal sinus mucosal thickening. Postsurgical changes related to chronic ethmoidectomy and maxillary antrostomy bilaterally. Normal aeration of the mastoid air cells. Debris within the external auditory canals, likely cerumen. No traumatic or inflammatory finding of the orbital compartments. Bilateral intra-ocular lens replacement. Other: None. CT CERVICAL SPINE FINDINGS Alignment: The atlas is rotated rightward on the axis, likely due to head positioning. Straightening of cervical lordosis and mild cervical spine dextrocurvature. No significant listhesis. Skull base and vertebrae: No acute fracture. No primary bone lesion or focal pathologic process. Soft tissues and spinal canal: Calcific atherosclerosis of the carotid bifurcations. Disc levels: Advanced cervical spondylosis with multilevel disc and facet degenerative changes greatest at the C3-C5 levels. Uncovertebral and facet hypertrophy encroaches on the neural foramen at bilateral C3-4, bilateral C4-5, and right C5-6. No high-grade bony canal stenosis. Upper chest: Partially visualized ascending aortic aneurysm. Chronic dissection  on prior CT of the chest is not visualized without contrast. Mild emphysema and scarring at the lung apices. Other: Negative. IMPRESSION: CT head: 1. Acute subdural hematoma over right cerebral convexity measuring up to 21 mm. Associated mass effect with 9 mm of right-to-left midline shift. No herniation at this time. 2. Large right frontal scalp and periorbital soft tissue contusion. No calvarial or orbital fracture identified. 3. Stable chronic microvascular ischemic changes and volume loss of the brain. CT CERVICAL SPINE: 1. No acute fracture or dislocation. 2. Advanced cervical spondylosis greatest at C3-C5 levels. Critical Value/emergent results were called by telephone at the time of interpretation on 12/25/2017 at 12:55 am to Dr. Thayer Jew , who verbally acknowledged these results. Electronically Signed   By: Kristine Garbe M.D.   On: 12/25/2017 00:58   None  Pending Labs Unresulted Labs (From admission, onward)    Start     Ordered   12/24/17 2329  Urinalysis, Routine w reflex microscopic  Once,   R     12/24/17 2328          Vitals/Pain Today's Vitals   12/25/17 0138 12/25/17 0159 12/25/17 0200 12/25/17 0215  BP: 137/90 (!) 128/103 (!) 141/89 127/85  Pulse: 97 92 95 96  Resp: 17 17 (!) 21 19  Temp: 97.9 F (36.6 C)  97.6 F (36.4 C)   TempSrc: Oral  Oral   SpO2:  95% 97% 97%    Isolation Precautions No active isolations  Medications Medications  levETIRAcetam (KEPPRA) IVPB 500 mg/100 mL premix (has no administration in time range)  hydrogen peroxide 3 % external solution (  Given 12/24/17 2321)  lidocaine-EPINEPHrine (XYLOCAINE W/EPI) 2 %-1:100000 (with pres) injection 20 mL (20 mLs Infiltration Given by Other 12/25/17 0000)  0.9 %  sodium chloride infusion (Manually program via Guardrails IV Fluids) ( Intravenous Stopped 12/25/17 0206)    Mobility walks

## 2017-12-25 NOTE — Progress Notes (Signed)
Patient arrived to 1232 via stretcher with ED RN. Transferred from stretcher to floor bed. NSR; BP WNL, 98% on RA. 1 PIV in place R UA. Bandage to head intact. Generalized bruising with large bruise on R arm. Will continue to closely monitor and await orders.

## 2017-12-25 NOTE — Progress Notes (Addendum)
Palliative Medicine RN Note: Consult order noted for GOC/hospice discussion. Patient is [facility].  PMT will be unavailable this morning. I will f/u with [facility] later today to ensure they continue GOC discussions, as they manage their plans of care.  Their liasons are in [place] if you need to speak to them before we are available.  Margret ChanceMelanie G. Chayanne Filippi, RN, BSN, [facility] Palliative Medicine Team 12/25/2017 8:15 AM Office [phone]     ADDENDUM: I spoke with Victorino DikeJennifer w [facility]. Patient is confirmed as an active hospice patient with them. They will address GOC & discharge planning. At this time, [facility] does not need PMT assistance & will contact us if this changes.   Referral will be cancelled.  Margret ChanceMelanie G. Jaston Havens, RN, BSN, [facility] Palliative Medicine Team 12/25/2017 10:38 AM Office [phone]

## 2017-12-25 NOTE — Progress Notes (Signed)
[facility] 1232- [facility] ([facility]) GIP RN Visit  This is a related and covered GIP admission of 12/25/17 with [facility] diagnosis of thoracic aortic dissection per Dr. Elliot Gurneyarlos Monguilod.  Pt has an OOF DNR.  Pt reportedly had an unwitnessed fall at facility and EMS was activated.  [facility] was not notified of this transfer or admission.  Pt was admitted to the hospital with a subdural hematoma.    This is GIP day 1.  Visited pt at the bedside, wife Ander SladeJoy was present.  Pt alert and smiled, but did not answer questions.  Pt had CT upon arrival at ED which showed a SDH with a 21mm midline shift.  NS was consulted, who recommended keppra, repeat head CT in 12 hours and move towards comfort care.    Pt receiving keppra IV 500 mg BID, on room air.  Will wait to see what subsequent CT shows.  Updated wife at bedside.  IDT updated  Goals of care: ongoing, will be assessed based on results of scan and pt condition  Discharge planning: ongoing, based on results of scan and pt condition  Medication list and transfer summary placed on the chart  Should ambulance transport be needed at time of discharge, please use [facility], as they contract this service for us.  Thank you, Wallis BambergJennifer Woody BSN, RN [facility] Hospital Liaison ([place] in [place]) [phone]

## 2017-12-25 NOTE — H&P (Signed)
History and Physical    [name]:[id number] DOB: [birth year] DOA: 12/24/2017  PCP: Georgann Housekeeper, MD Patient coming from: [facility]  Chief Complaint: Unwitnessed fall  HPI: [name] is a 84 y.o. male with medical history significant of coronary artery disease status post PCI, essential hypertension, GERD, hyperlipidemia, alziehmers dementia who resides at [facility] living facility had an unwitnessed fall.  Patient is a poor historian therefore most of the history is per chart. I spoke with the patient's wife [name] over the phone and she tells me patient is wheelchair-bound and last night when he tried to get up he ended up sustaining a fall.  At that time he was noted to have right eye hematoma therefore sent to the hospital for evaluation especially since he is on aspirin and Plavix.  Wife was in presence of the history is somewhat limited.  Upon admission his routine labs are unremarkable but CT of the head showed 21 mm subdural hematoma with 9 mm midline shift from right-to-left.  Neurosurgery was consulted who recommended repeating CT head in 12 hours and starting Keppra 500 mg twice daily for 7 days, discontinuing aspirin and Plavix.  Also recommended transitioning to comfort care.  Medicine team asked to admit the patient.  Social history-patient denies any tobacco, alcohol or illicit drug use  Review of Systems: As per HPI otherwise 10 point review of systems negative.  Review of Systems Otherwise negative except as per HPI, including: General: Denies fever, chills, night sweats or unintended weight loss. Resp: Denies cough, wheezing, shortness of breath. Cardiac: Denies chest pain, palpitations, orthopnea, paroxysmal nocturnal dyspnea. GI: Denies abdominal pain, nausea, vomiting, diarrhea or constipation GU: Denies dysuria, frequency, hesitancy or incontinence MS: Denies muscle aches, joint pain or swelling Neuro: Denies headache, neurologic  deficits (focal weakness, numbness, tingling), abnormal gait Psych: Denies anxiety, depression, SI/HI/AVH Skin: Denies new rashes or lesions ID: Denies sick contacts, exotic exposures, travel  Past Medical History:  Diagnosis Date  . Allergic rhinitis   . Arthritis   . Constipation    takes Miralax daily as needed  . Coronary artery disease    s/p remote IWMI 2004 with PCI of the RCA with BMS and repeat cath 2007 due to recurrent CP and abnormal nuclear stress test and cath showed widely patent RCA stent with 30-50% distal stenosis before the takeoff of a PL and PDA and normal LVF.   [name] Depression with anxiety    takes Clonazepam nightly  . GERD (gastroesophageal reflux disease)    takes Omeprazole and Pepcid daily  . Heart attack (HCC)   . History of bronchitis    pulmonologist is Dr.Wert.  . Hyperlipidemia    takes Atorvastatin daily  . Hypertension    takes Diovan daily  . Joint pain   . Memory loss   . RLS (restless legs syndrome)   . Unspecified vitamin D deficiency   . Vitamin B 12 deficiency     Past Surgical History:  Procedure Laterality Date  . CARDIAC CATHETERIZATION  2004  . cataract surgery    . COLONOSCOPY    . CORONARY ANGIOPLASTY     1 stent  . ESOPHAGOGASTRODUODENOSCOPY    . HAND SURGERY Bilateral   . HERNIA REPAIR Left    inguinal   . NASAL SINUS SURGERY    . REVERSE SHOULDER ARTHROPLASTY Right 04/29/2014   Procedure: REVERSE TOTAL SHOULDER ARTHROPLASTY ;  Surgeon: Francena Hanly, MD;  Location: [facility] OR;  Service: Orthopedics;  Laterality: Right;  . stents      SOCIAL HISTORY:  reports that he quit smoking about 53 years ago. His smoking use included cigarettes. He has a 45.00 pack-year smoking history. He has never used smokeless tobacco. He reports that he does not drink alcohol or use drugs.  No Known Allergies  FAMILY HISTORY: Family History  Problem Relation Age of Onset  . Asthma Mother   . Cancer - Prostate Father      Prior to Admission  medications   Medication Sig Start Date End Date Taking? Authorizing Provider  aspirin EC 81 MG EC tablet Take 1 tablet (81 mg total) by mouth daily. 08/02/17  Yes Georgie ChardMcDaniel, Jill D, NP  atorvastatin (LIPITOR) 20 MG tablet Take 1 tablet (20 mg total) by mouth daily at 6 PM. Patient taking differently: Take 20 mg by mouth at bedtime.  08/01/17  Yes Georgie ChardMcDaniel, Jill D, NP  carvedilol (COREG) 3.125 MG tablet Take 1 tablet (3.125 mg total) by mouth 2 (two) times daily with a meal. 08/01/17  Yes Georgie ChardMcDaniel, Jill D, NP  chlorpheniramine (ALLERGY) 4 MG tablet Take 4 mg by mouth 2 (two) times daily.    Yes [provider]  clopidogrel (PLAVIX) 75 MG tablet Take 1 tablet (75 mg total) by mouth daily. 08/02/17  Yes Georgie ChardMcDaniel, Jill D, NP  donepezil (ARICEPT) 10 MG tablet Take 1 tablet (10 mg total) by mouth at bedtime. Start 1/2 tablet daily x 4 weeks then 1 tablet daily Patient taking differently: Take 10 mg by mouth every morning.  02/19/17  Yes [name], Pramod S, MD  escitalopram (LEXAPRO) 10 MG tablet Take 10 mg by mouth daily.   Yes [provider]  fluticasone (FLONASE) 50 MCG/ACT nasal spray Place 1 spray into both nostrils 2 (two) times daily. Reported on 03/07/2015   Yes [provider]  guaiFENesin (MUCINEX) 600 MG 12 hr tablet Take 600 mg by mouth 2 (two) times daily.   Yes [provider]  memantine (NAMENDA) 10 MG tablet Take 10 mg by mouth 2 (two) times daily.   Yes [provider]  Multiple Vitamin (MULTIVITAMIN WITH MINERALS) TABS tablet Take 1 tablet by mouth daily.   Yes [provider]  Nutritional Supplements (NUTRITIONAL DRINK) LIQD Take 1 Can by mouth 3 (three) times daily. Mighty Shake   Yes [provider]  tamsulosin (FLOMAX) 0.4 MG CAPS capsule Take 0.4 mg by mouth daily.   Yes [provider]  tiotropium (SPIRIVA) 18 MCG inhalation capsule Place 1 capsule (18 mcg total) into inhaler and inhale daily. 03/31/16  Yes Phelps, Erin  O, PA-C  Tiotropium Bromide-Olodaterol (STIOLTO RESPIMAT) 2.5-2.5 MCG/ACT AERS Inhale 2 puffs into the lungs daily.    Yes [provider]  traZODone (DESYREL) 50 MG tablet Take 50 mg by mouth at bedtime. 10/06/17  Yes [provider]  memantine (NAMENDA TITRATION PAK) tablet pack 5 mg/day for =1 week; 5 mg twice daily for =1 week; 15 mg/day given in 5 mg and 10 mg separated doses for =1 week; then 10 mg twice daily Patient not taking: Reported on 12/25/2017 06/17/17   George HughVanschaick, Jessica, NP  nitroGLYCERIN (NITROSTAT) 0.4 MG SL tablet Place 1 tablet (0.4 mg total) under the tongue every 5 (five) minutes x 3 doses as needed for chest pain. 08/01/17   Filbert SchilderMcDaniel, Jill D, NP    Physical Exam: Vitals:   12/25/17 0415 12/25/17 0530 12/25/17 0600 12/25/17 0630  BP: (!) 148/78 127/76 116/84 137/86  Pulse: 97 93 91 89  Resp: 17 17 13 15   Temp:      TempSrc:      SpO2: 97% 100% 100% 98%      Constitutional: Elderly frail appearing Eyes: Large hematoma with swelling of his right eye ENMT: Mucous membranes are moist. Posterior pharynx clear of any exudate or lesions.Normal dentition.  Neck: normal, supple, no masses, no thyromegaly Respiratory: Diminished breath sounds bilateral. Cardiovascular: Regular rate and rhythm, no murmurs / rubs / gallops. No extremity edema. 2+ pedal pulses. No carotid bruits.  Abdomen: no tenderness, no masses palpated. No hepatosplenomegaly. Bowel sounds positive.  Musculoskeletal: no clubbing / cyanosis. No joint deformity upper and lower extremities. Good ROM, no contractures. Normal muscle tone.  Skin: no rashes, lesions, ulcers. No induration Neurologic: Strength is noted to be 4/5 in his left upper and both of his lower extremities.  Right upper extremity is 3+/5.  No other focal neuro deficits noted at this time.  Rest of the cranial nerves intact.  Unable to test ocular cranial nerves on the right side Psychiatric: Normal judgment and insight.   Alert, awake and oriented X2.    Labs on Admission: I have personally reviewed following labs and imaging studies  CBC: Recent Labs  Lab 12/25/17 0117  WBC 6.4  NEUTROABS 4.3  HGB 11.2*  HCT 35.7*  MCV 94.7  PLT 146*   Basic Metabolic Panel: Recent Labs  Lab 12/25/17 0117  NA 137  K 4.1  CL 106  CO2 25  GLUCOSE 113*  BUN 17  CREATININE 1.14  CALCIUM 8.9   GFR: CrCl cannot be calculated (Unknown ideal weight.). Liver Function Tests: No results for input(s): AST, ALT, ALKPHOS, BILITOT, PROT, ALBUMIN in the last 168 hours. No results for input(s): LIPASE, AMYLASE in the last 168 hours. No results for input(s): AMMONIA in the last 168 hours. Coagulation Profile: Recent Labs  Lab 12/25/17 0117  INR 1.14   Cardiac Enzymes: No results for input(s): CKTOTAL, CKMB, CKMBINDEX, TROPONINI in the last 168 hours. BNP (last 3 results) No results for input(s): PROBNP in the last 8760 hours. HbA1C: No results for input(s): HGBA1C in the last 72 hours. CBG: No results for input(s): GLUCAP in the last 168 hours. Lipid Profile: No results for input(s): CHOL, HDL, LDLCALC, TRIG, CHOLHDL, LDLDIRECT in the last 72 hours. Thyroid Function Tests: No results for input(s): TSH, T4TOTAL, FREET4, T3FREE, THYROIDAB in the last 72 hours. Anemia Panel: No results for input(s): VITAMINB12, FOLATE, FERRITIN, TIBC, IRON, RETICCTPCT in the last 72 hours. Urine analysis:    Component Value Date/Time   COLORURINE YELLOW 07/26/2017 0040   APPEARANCEUR CLEAR 07/26/2017 0040   LABSPEC 1.023 07/26/2017 0040   PHURINE 5.0 07/26/2017 0040   GLUCOSEU NEGATIVE 07/26/2017 0040   HGBUR SMALL (A) 07/26/2017 0040   BILIRUBINUR NEGATIVE 07/26/2017 0040   KETONESUR NEGATIVE 07/26/2017 0040   PROTEINUR NEGATIVE 07/26/2017 0040   UROBILINOGEN 1.0 05/23/2014 1213   NITRITE NEGATIVE 07/26/2017 0040   LEUKOCYTESUR NEGATIVE 07/26/2017 0040   Sepsis Labs:  !!!!!!!!!!!!!!!!!!!!!!!!!!!!!!!!!!!!!!!!!!!! @LABRCNTIP (procalcitonin:4,lacticidven:4) )No results found for this or any previous visit (from the past 240 hour(s)).   Radiological Exams on Admission: Ct Head Wo Contrast  Result Date: 12/25/2017 CLINICAL DATA:  84 y/o M; unwitnessed fall. Laceration to the right eye with hematoma. EXAM: CT HEAD WITHOUT CONTRAST CT CERVICAL SPINE WITHOUT CONTRAST TECHNIQUE: Multidetector CT imaging of the head and cervical spine was performed following the standard protocol without intravenous contrast. Multiplanar CT image  reconstructions of the cervical spine were also generated. COMPARISON:  10/13/2017 CT head. 10/14/2017 CT cervical spine. 10/23/2017 CT chest. FINDINGS: CT HEAD FINDINGS Brain: Acute subdural hematoma over the right cerebral convexity measuring up to 21 mm at the level of the anterior sylvian fissure. Associated effect effaces sulci of the right cerebral convexity, partially effaces the right lateral ventricle, and results in 9 mm of right-to-left midline shift of the septum pellucidum. No herniation at this time. No brain parenchymal hemorrhage or stroke. Stable chronic microvascular ischemic changes and volume loss of the brain. Vascular: Calcific atherosclerosis of carotid siphons and the vertebral arteries. No hyperdense vessel identified. Skull: Large right frontal scalp and periorbital soft tissue contusion with small foci of air indicating laceration. No calvarial or orbital fracture. Sinuses/Orbits: Mild diffuse paranasal sinus mucosal thickening. Postsurgical changes related to chronic ethmoidectomy and maxillary antrostomy bilaterally. Normal aeration of the mastoid air cells. Debris within the external auditory canals, likely cerumen. No traumatic or inflammatory finding of the orbital compartments. Bilateral intra-ocular lens replacement. Other: None. CT CERVICAL SPINE FINDINGS Alignment: The atlas is rotated rightward on the axis, likely due to  head positioning. Straightening of cervical lordosis and mild cervical spine dextrocurvature. No significant listhesis. Skull base and vertebrae: No acute fracture. No primary bone lesion or focal pathologic process. Soft tissues and spinal canal: Calcific atherosclerosis of the carotid bifurcations. Disc levels: Advanced cervical spondylosis with multilevel disc and facet degenerative changes greatest at the C3-C5 levels. Uncovertebral and facet hypertrophy encroaches on the neural foramen at bilateral C3-4, bilateral C4-5, and right C5-6. No high-grade bony canal stenosis. Upper chest: Partially visualized ascending aortic aneurysm. Chronic dissection on prior CT of the chest is not visualized without contrast. Mild emphysema and scarring at the lung apices. Other: Negative. IMPRESSION: CT head: 1. Acute subdural hematoma over right cerebral convexity measuring up to 21 mm. Associated mass effect with 9 mm of right-to-left midline shift. No herniation at this time. 2. Large right frontal scalp and periorbital soft tissue contusion. No calvarial or orbital fracture identified. 3. Stable chronic microvascular ischemic changes and volume loss of the brain. CT CERVICAL SPINE: 1. No acute fracture or dislocation. 2. Advanced cervical spondylosis greatest at C3-C5 levels. Critical Value/emergent results were called by telephone at the time of interpretation on 12/25/2017 at 12:55 am to Dr. Ross Marcus , who verbally acknowledged these results. Electronically Signed   By: Mitzi Hansen M.D.   On: 12/25/2017 00:58   Ct Cervical Spine Wo Contrast  Result Date: 12/25/2017 CLINICAL DATA:  84 y/o M; unwitnessed fall. Laceration to the right eye with hematoma. EXAM: CT HEAD WITHOUT CONTRAST CT CERVICAL SPINE WITHOUT CONTRAST TECHNIQUE: Multidetector CT imaging of the head and cervical spine was performed following the standard protocol without intravenous contrast. Multiplanar CT image reconstructions of  the cervical spine were also generated. COMPARISON:  10/13/2017 CT head. 10/14/2017 CT cervical spine. 10/23/2017 CT chest. FINDINGS: CT HEAD FINDINGS Brain: Acute subdural hematoma over the right cerebral convexity measuring up to 21 mm at the level of the anterior sylvian fissure. Associated effect effaces sulci of the right cerebral convexity, partially effaces the right lateral ventricle, and results in 9 mm of right-to-left midline shift of the septum pellucidum. No herniation at this time. No brain parenchymal hemorrhage or stroke. Stable chronic microvascular ischemic changes and volume loss of the brain. Vascular: Calcific atherosclerosis of carotid siphons and the vertebral arteries. No hyperdense vessel identified. Skull: Large right frontal scalp and periorbital soft tissue  contusion with small foci of air indicating laceration. No calvarial or orbital fracture. Sinuses/Orbits: Mild diffuse paranasal sinus mucosal thickening. Postsurgical changes related to chronic ethmoidectomy and maxillary antrostomy bilaterally. Normal aeration of the mastoid air cells. Debris within the external auditory canals, likely cerumen. No traumatic or inflammatory finding of the orbital compartments. Bilateral intra-ocular lens replacement. Other: None. CT CERVICAL SPINE FINDINGS Alignment: The atlas is rotated rightward on the axis, likely due to head positioning. Straightening of cervical lordosis and mild cervical spine dextrocurvature. No significant listhesis. Skull base and vertebrae: No acute fracture. No primary bone lesion or focal pathologic process. Soft tissues and spinal canal: Calcific atherosclerosis of the carotid bifurcations. Disc levels: Advanced cervical spondylosis with multilevel disc and facet degenerative changes greatest at the C3-C5 levels. Uncovertebral and facet hypertrophy encroaches on the neural foramen at bilateral C3-4, bilateral C4-5, and right C5-6. No high-grade bony canal stenosis. Upper  chest: Partially visualized ascending aortic aneurysm. Chronic dissection on prior CT of the chest is not visualized without contrast. Mild emphysema and scarring at the lung apices. Other: Negative. IMPRESSION: CT head: 1. Acute subdural hematoma over right cerebral convexity measuring up to 21 mm. Associated mass effect with 9 mm of right-to-left midline shift. No herniation at this time. 2. Large right frontal scalp and periorbital soft tissue contusion. No calvarial or orbital fracture identified. 3. Stable chronic microvascular ischemic changes and volume loss of the brain. CT CERVICAL SPINE: 1. No acute fracture or dislocation. 2. Advanced cervical spondylosis greatest at C3-C5 levels. Critical Value/emergent results were called by telephone at the time of interpretation on 12/25/2017 at 12:55 am to Dr. Ross Marcus , who verbally acknowledged these results. Electronically Signed   By: Mitzi Hansen M.D.   On: 12/25/2017 00:58     All images have been reviewed by me personally.   Assessment/Plan Active Problems:   Dyslipidemia   CAD (coronary artery disease), native coronary artery   Esophageal reflux   Dementia (HCC)   Subdural hematoma (HCC)    Unwitnessed fall Subdural hematoma with midline shift right to left - Admit the patient to the hospital for supportive measures.  Hold on aspirin and Plavix -Keppra 500 mg twice daily IV started for at least 7 days for prophylaxis seizure - Consult palliative care provider- considering hospice care/comfort care at this time.  Discussed this with the wife. - Repeat CT of the head at 12 PM today -Frequent neurochecks. - We will give him comfort feeding at this time as tolerated.  History of coronary disease status post PCI -Home medications are on hold including aspirin, Plavix, Coreg, statin  Alzheimer's dementia with occasional behavioral disturbances -Progressively worsening outpatient.  On Aricept and Namenda which is on  hold. -Lexapro on hold  History of BPH -On Flomax at home  GERD -PPI as needed  DVT prophylaxis: SCDs Code Status: DNR/DNI Family Communication: Spoke with the patient's wife [name] over the phone Disposition Plan: To be determined Consults called: Neurosurgery Admission status: Stepdown unit admission for now for frequent neuro checks.  Once he is made comfort/hospice care he can be transitioned to medical surgical unit.   Time Spent: 65 minutes.  >50% of the time was devoted to discussing the patients care, assessment, plan and disposition with other care givers along with counseling the patient about the risks and benefits of treatment.    Blease Capaldi Joline Maxcy MD [facility] Pager [phone]  If 7PM-7AM, please contact night-coverage [email or web address] Password [facility]  12/25/2017, 7:58  AM  

## 2017-12-25 NOTE — ED Provider Notes (Signed)
LACERATION REPAIR Performed by: Jacinto HalimMichael M Keyaan Lederman Authorized by: Jacinto HalimMichael M Avriana Joo Consent: Verbal consent obtained. Risks and benefits: risks, benefits and alternatives were discussed Consent given by: patient Patient identity confirmed: provided demographic data Prepped and Draped in normal sterile fashion Wound explored  Laceration Location: lateral to the right eye  Laceration Length: 4cm  No Foreign Bodies seen or palpated  Anesthesia: local infiltration  Local anesthetic: lidocaine 2% with epinephrine  Anesthetic total: 8 ml  Irrigation method: syringe Amount of cleaning: standard  Skin closure: 5-0 ethilon  Number of sutures: 6  Technique: simple interrupted   Patient tolerance: Patient tolerated the procedure well with no immediate complications.    Jacinto HalimMaczis, Lloyd Cullinan M, PA-C 12/25/17 [phone]    Shon BatonHorton, Courtney F, MD 12/25/17 [phone]

## 2017-12-25 NOTE — ED Notes (Addendum)
Changed dressing to right eye x2 as it will not stop bleeding and soaks through the dressing. MD informed.

## 2017-12-26 DIAGNOSIS — F322 Major depressive disorder, single episode, severe without psychotic features: Secondary | ICD-10-CM | POA: Diagnosis not present

## 2017-12-26 DIAGNOSIS — W19XXXA Unspecified fall, initial encounter: Secondary | ICD-10-CM

## 2017-12-26 DIAGNOSIS — S0083XA Contusion of other part of head, initial encounter: Secondary | ICD-10-CM | POA: Diagnosis not present

## 2017-12-26 DIAGNOSIS — I6200 Nontraumatic subdural hemorrhage, unspecified: Secondary | ICD-10-CM | POA: Diagnosis not present

## 2017-12-26 DIAGNOSIS — E46 Unspecified protein-calorie malnutrition: Secondary | ICD-10-CM | POA: Diagnosis not present

## 2017-12-26 DIAGNOSIS — F0391 Unspecified dementia with behavioral disturbance: Secondary | ICD-10-CM | POA: Diagnosis not present

## 2017-12-26 DIAGNOSIS — R2689 Other abnormalities of gait and mobility: Secondary | ICD-10-CM | POA: Diagnosis not present

## 2017-12-26 DIAGNOSIS — I2510 Atherosclerotic heart disease of native coronary artery without angina pectoris: Secondary | ICD-10-CM | POA: Diagnosis not present

## 2017-12-26 DIAGNOSIS — Y92129 Unspecified place in nursing home as the place of occurrence of the external cause: Secondary | ICD-10-CM

## 2017-12-26 DIAGNOSIS — E785 Hyperlipidemia, unspecified: Secondary | ICD-10-CM

## 2017-12-26 DIAGNOSIS — S065X9A Traumatic subdural hemorrhage with loss of consciousness of unspecified duration, initial encounter: Principal | ICD-10-CM

## 2017-12-26 DIAGNOSIS — I4891 Unspecified atrial fibrillation: Secondary | ICD-10-CM | POA: Diagnosis not present

## 2017-12-26 DIAGNOSIS — J449 Chronic obstructive pulmonary disease, unspecified: Secondary | ICD-10-CM | POA: Diagnosis not present

## 2017-12-26 LAB — COMPREHENSIVE METABOLIC PANEL
ALBUMIN: 3.3 g/dL — AB (ref 3.5–5.0)
ALT: 41 U/L (ref 0–44)
AST: 38 U/L (ref 15–41)
Alkaline Phosphatase: 83 U/L (ref 38–126)
Anion gap: 6 (ref 5–15)
BUN: 14 mg/dL (ref 8–23)
CHLORIDE: 104 mmol/L (ref 98–111)
CO2: 25 mmol/L (ref 22–32)
CREATININE: 1.1 mg/dL (ref 0.61–1.24)
Calcium: 8.5 mg/dL — ABNORMAL LOW (ref 8.9–10.3)
GFR calc Af Amer: >60 mL/min (ref >60)
GFR calc non Af Amer: 60 mL/min — ABNORMAL LOW (ref >60)
Glucose, Bld: 102 mg/dL — ABNORMAL HIGH (ref 70–99)
POTASSIUM: 3.7 mmol/L (ref 3.5–5.1)
SODIUM: 135 mmol/L (ref 135–145)
Total Bilirubin: 1 mg/dL (ref 0.3–1.2)
Total Protein: 5.7 g/dL — ABNORMAL LOW (ref 6.5–8.1)

## 2017-12-26 LAB — BPAM PLATELET PHERESIS
BLOOD PRODUCT EXPIRATION DATE: 201911202359
ISSUE DATE / TIME: 201911200126
Unit Type and Rh: 6200

## 2017-12-26 LAB — CBC
HEMATOCRIT: 32.7 % — AB (ref 39.0–52.0)
HEMOGLOBIN: 10.4 g/dL — AB (ref 13.0–17.0)
MCH: 30.5 pg (ref 26.0–34.0)
MCHC: 31.8 g/dL (ref 30.0–36.0)
MCV: 95.9 fL (ref 80.0–100.0)
NRBC: 0 % (ref 0.0–0.2)
Platelets: 127 10*3/uL — ABNORMAL LOW (ref 150–400)
RBC: 3.41 MIL/uL — ABNORMAL LOW (ref 4.22–5.81)
RDW: 15.9 % — AB (ref 11.5–15.5)
WBC: 6.4 10*3/uL (ref 4.0–10.5)

## 2017-12-26 LAB — GLUCOSE, CAPILLARY
Glucose-Capillary: 114 mg/dL — ABNORMAL HIGH (ref 70–99)
Glucose-Capillary: 93 mg/dL (ref 70–99)
Glucose-Capillary: 96 mg/dL (ref 70–99)

## 2017-12-26 LAB — PREPARE PLATELET PHERESIS: Unit division: 0

## 2017-12-26 LAB — APTT: APTT: 33 s (ref 24–36)

## 2017-12-26 LAB — PROTIME-INR
INR: 1.27
Prothrombin Time: 15.7 seconds — ABNORMAL HIGH (ref 11.4–15.2)

## 2017-12-26 MED ORDER — LEVETIRACETAM 500 MG PO TABS: 500.0000 mg | ORAL_TABLET | Freq: Two times a day (BID) | ORAL | 0 refills | Status: DC

## 2017-12-26 MED ORDER — LEVETIRACETAM 500 MG PO TABS: 500.0000 mg | ORAL_TABLET | Freq: Two times a day (BID) | ORAL | 0 refills | Status: AC

## 2017-12-26 MED ORDER — SENNOSIDES-DOCUSATE SODIUM 8.6-50 MG PO TABS: 1.0000 | ORAL_TABLET | Freq: Every evening | ORAL | Status: DC | PRN

## 2017-12-26 MED ORDER — LEVETIRACETAM 500 MG PO TABS: 500.0000 mg | ORAL_TABLET | Freq: Two times a day (BID) | ORAL | Status: DC

## 2017-12-26 MED ORDER — SENNOSIDES-DOCUSATE SODIUM 8.6-50 MG PO TABS: 1.0000 | ORAL_TABLET | Freq: Every evening | ORAL | 0 refills | Status: DC | PRN

## 2017-12-26 MED ORDER — SENNOSIDES-DOCUSATE SODIUM 8.6-50 MG PO TABS: 1.0000 | ORAL_TABLET | Freq: Every evening | ORAL | 0 refills | Status: AC | PRN

## 2017-12-26 MED ORDER — HALOPERIDOL LACTATE 5 MG/ML IJ SOLN
2.5000 mg | Freq: Once | INTRAMUSCULAR | Status: AC
  Administered 2017-12-26: 2.5 mg via INTRAVENOUS

## 2017-12-26 NOTE — Discharge Summary (Signed)
Physician Discharge Summary  ORESTE MAJEED ZOX:[id number] DOB: 1931-08-08 DOA: 12/24/2017  PCP: Georgann Housekeeper, MD  Admit date: 12/24/2017 Discharge date: 12/26/2017  Admitted From: SNF Disposition: SNF with Hospice   Recommendations for Outpatient Follow-up:  1. Follow up Care per Hospice Protocol 2. Follow up with Neuro-Surgery if needed but they do not recommend any surgical interventions and recommending continuing Palliative Discussion and Transition to Comfort Care if Necessary  Home Health: No Equipment/Devices: None  Discharge Condition: Stable CODE STATUS: DO NOT RESUSCITATE Diet recommendation: Regular Diet  Brief/Interim Summary: HPI per Dr. Stephania Fragmin on 12/25/17 [name] is a 84 y.o. male with medical history significant of coronary artery disease status post PCI, essential hypertension, GERD, hyperlipidemia, alziehmers dementia who resides at [facility] living facility had an unwitnessed fall.  Patient is a poor historian therefore most of the history is per chart. I spoke with the patient's wife [name] over the phone and she tells me patient is wheelchair-bound and last night when he tried to get up he ended up sustaining a fall.  At that time he was noted to have right eye hematoma therefore sent to the hospital for evaluation especially since he is on aspirin and Plavix.  Wife was in presence of the history is somewhat limited.  Upon admission his routine labs are unremarkable but CT of the head showed 21 mm subdural hematoma with 9 mm midline shift from right-to-left.  Neurosurgery was consulted who recommended repeating CT head in 12 hours and starting Keppra 500 mg twice daily for 7 days, discontinuing aspirin and Plavix.  Also recommended transitioning to comfort care.  Medicine team asked to admit the patient.  **Patient remained stable and repeat CT scan of the head showed overall decreased thickness and improved mass-effect related to the acute right subdural  hematoma as described earlier yesterday.  The midline shift was also improved now 3 millimeters.  There is continued surveillance that was warranted.  Neurosurgery still feel that he is not a candidate for surgical intervention and states that the continue monitor and transfer back to skilled nursing facility.  They feel that he needs to transition to comfort care if he continues to clinically decline but currently he is stable.  Neurosurgery recommending continuing holding aspirin and Plavix.  He will be discharged back to his skilled nursing facility and placed on Keppra 500 mg p.o. twice daily for 7 days for seizure prophylaxis and need close follow-up.  If patient clinically declines then likely will be transitioned to full comfort care and this can be done at the facility and patient can go to be complete from there but currently I do not feel he is a candidate for [employer] at this time.  Discharge Diagnoses:  Active Problems:   Dyslipidemia   CAD (coronary artery disease), native coronary artery   Esophageal reflux   Dementia (HCC)   Subdural hematoma (HCC)  Unwitnessed fall Subdural hematoma with midline shift right to left - Admitted the patient to the hospital for supportive measures.  Continue to Hold on aspirin and Plavix -Keppra 500 mg twice daily IV started for at least 7 days for prophylaxis seizure changed to po - Consult palliative care provider- considering hospice care/comfort care at this time but currently stablee. -Repeat CT of the head at improved somewhat and showed decrease in midline shift -Frequent neurochecks. -Continue Regular Diet for now -Surgery evaluated and feel that he may not be a surgical candidate given his baseline cognitive condition  and multiple medical comorbidities.  Recommend continue monitoring and transferring back to skilled nursing facility.  And they recommend strongly transitioning to palliative and comfort care if he continues to clinically  decline but currently he is stable and eating without issues. -Neurosurgery recommending continue to hold aspirin and Plavix and continuing Keppra for 7 days total  History of coronary disease status post PCI -Home medications are on hold including aspirin, Plavix, Coreg, statin -Will resume Coreg and Statin but continue to Hold ASA and Plavix  Alzheimer's dementia with occasional behavioral disturbances -Progressively worsening outpatient.  On Aricept and Namenda which is on hold while hospitalized but resume at D/C -Lexapro on hold but resumed at D/C  History of BPH -On Flomax at home and resumed at D/C  GERD -C/w PPI as needed  Normocytic anemia -Patient's hemoglobin/hematocrit went from 11.2/35.7 and is now 10.4/32.7 -Continue to hold aspirin Plavix -Subdural hematoma has improved somewhat -Continue monitor for signs of bleeding -Repeat CBC in outpatient setting  Thrombocytopenia -Patient was transfused 1 pool platelets -Count went from 146 is now at 127 -Continue to monitor for signs and symptoms of bleeding -Repeat CBC in outpatient setting  Discharge Instructions  Discharge Instructions    Call MD for:  difficulty breathing, headache or visual disturbances   Complete by:  As directed    Call MD for:  extreme fatigue   Complete by:  As directed    Call MD for:  hives   Complete by:  As directed    Call MD for:  persistant dizziness or light-headedness   Complete by:  As directed    Call MD for:  persistant nausea and vomiting   Complete by:  As directed    Call MD for:  redness, tenderness, or signs of infection (pain, swelling, redness, odor or green/yellow discharge around incision site)   Complete by:  As directed    Call MD for:  severe uncontrolled pain   Complete by:  As directed    Call MD for:  temperature >100.4   Complete by:  As directed    Diet general   Complete by:  As directed    Discharge instructions   Complete by:  As directed    You  were cared for by a hospitalist during your hospital stay. If you have any questions about your discharge medications or the care you received while you were in the hospital after you are discharged, you can call the unit and ask to speak with the hospitalist on call if the hospitalist that took care of you is not available. Once you are discharged, your primary care physician will handle any further medical issues. Please note that NO REFILLS for any discharge medications will be authorized once you are discharged, as it is imperative that you return to your primary care physician (or establish a relationship with a primary care physician if you do not have one) for your aftercare needs so that they can reassess your need for medications and monitor your lab values.  Follow up with PCP and continued care with Hospice. Follow up with Neurosurgery if needed. Take all medications as prescribed. If symptoms change or worsen continue to discuss with Hospice about transitioning to comfort care.   Increase activity slowly   Complete by:  As directed      Allergies as of 12/26/2017   No Known Allergies     Medication List    STOP taking these medications   aspirin 81 MG EC  tablet   clopidogrel 75 MG tablet Commonly known as:  PLAVIX   tiotropium 18 MCG inhalation capsule Commonly known as:  SPIRIVA     TAKE these medications   ALLERGY 4 MG tablet Generic drug:  chlorpheniramine Take 4 mg by mouth 2 (two) times daily.   atorvastatin 20 MG tablet Commonly known as:  LIPITOR Take 1 tablet (20 mg total) by mouth daily at 6 PM. What changed:  when to take this   carvedilol 3.125 MG tablet Commonly known as:  COREG Take 1 tablet (3.125 mg total) by mouth 2 (two) times daily with a meal.   donepezil 10 MG tablet Commonly known as:  ARICEPT Take 1 tablet (10 mg total) by mouth at bedtime. Start 1/2 tablet daily x 4 weeks then 1 tablet daily What changed:    when to take this  additional  instructions   escitalopram 10 MG tablet Commonly known as:  LEXAPRO Take 10 mg by mouth daily.   fluticasone 50 MCG/ACT nasal spray Commonly known as:  FLONASE Place 1 spray into both nostrils 2 (two) times daily. Reported on 03/07/2015   guaiFENesin 600 MG 12 hr tablet Commonly known as:  MUCINEX Take 600 mg by mouth 2 (two) times daily.   levETIRAcetam 500 MG tablet Commonly known as:  KEPPRA Take 1 tablet (500 mg total) by mouth 2 (two) times daily for 7 days.   memantine 10 MG tablet Commonly known as:  NAMENDA Take 10 mg by mouth 2 (two) times daily. What changed:  Another medication with the same name was removed. Continue taking this medication, and follow the directions you see here.   multivitamin with minerals Tabs tablet Take 1 tablet by mouth daily.   nitroGLYCERIN 0.4 MG SL tablet Commonly known as:  NITROSTAT Place 1 tablet (0.4 mg total) under the tongue every 5 (five) minutes x 3 doses as needed for chest pain.   NUTRITIONAL DRINK Liqd Take 1 Can by mouth 3 (three) times daily. Mighty Shake   senna-docusate 8.6-50 MG tablet Commonly known as:  Senokot-S Take 1 tablet by mouth at bedtime as needed for mild constipation.   STIOLTO RESPIMAT 2.5-2.5 MCG/ACT Aers Generic drug:  Tiotropium Bromide-Olodaterol Inhale 2 puffs into the lungs daily.   tamsulosin 0.4 MG Caps capsule Commonly known as:  FLOMAX Take 0.4 mg by mouth daily.   traZODone 50 MG tablet Commonly known as:  DESYREL Take 50 mg by mouth at bedtime.       No Known Allergies  Consultations:  Neurosurgery  Procedures/Studies: Ct Head Wo Contrast  Result Date: 12/25/2017 CLINICAL DATA:  Status post fall in home. Subdural hematoma. Continued surveillance. EXAM: CT HEAD WITHOUT CONTRAST TECHNIQUE: Contiguous axial images were obtained from the base of the skull through the vertex without intravenous contrast. COMPARISON:  CT head without contrast at 12:33 a.m. FINDINGS: Brain: There  has been slight decrease overall in the thickness of the acute RIGHT subdural hematoma for instance on coronal series 4, image 34, thickness is 15 mm as compared with 20 mm earlier. Over the convexity, as seen on series 2, image 24, thickness is 11 mm, as compared with 16 mm earlier. RIGHT-to-LEFT shift is improved, 3 mm on the current scan, as compared with 7 mm earlier. Slight effacement of the RIGHT lateral ventricle, mass effect on the RIGHT temporal and frontal lobes. Generalized atrophy. No parenchymal hemorrhage. Vascular: Calcification of the cavernous internal carotid arteries consistent with cerebrovascular atherosclerotic disease. No signs of intracranial  large vessel occlusion. Skull: No skull fracture.  Large scalp hematoma. Sinuses/Orbits: Diffuse chronic sinus disease. Negative orbits. BILATERAL cataract extraction. Other: None IMPRESSION: Overall decreased thickness and improved mass effect related to the acute RIGHT subdural hematoma described earlier today. Midline shift is also improved, now 3 mm. Continued surveillance is warranted. Electronically Signed   By: Elsie Stain M.D.   On: 12/25/2017 15:46   Ct Head Wo Contrast  Result Date: 12/25/2017 CLINICAL DATA:  84 y/o M; unwitnessed fall. Laceration to the right eye with hematoma. EXAM: CT HEAD WITHOUT CONTRAST CT CERVICAL SPINE WITHOUT CONTRAST TECHNIQUE: Multidetector CT imaging of the head and cervical spine was performed following the standard protocol without intravenous contrast. Multiplanar CT image reconstructions of the cervical spine were also generated. COMPARISON:  10/13/2017 CT head. 10/14/2017 CT cervical spine. 10/23/2017 CT chest. FINDINGS: CT HEAD FINDINGS Brain: Acute subdural hematoma over the right cerebral convexity measuring up to 21 mm at the level of the anterior sylvian fissure. Associated effect effaces sulci of the right cerebral convexity, partially effaces the right lateral ventricle, and results in 9 mm of  right-to-left midline shift of the septum pellucidum. No herniation at this time. No brain parenchymal hemorrhage or stroke. Stable chronic microvascular ischemic changes and volume loss of the brain. Vascular: Calcific atherosclerosis of carotid siphons and the vertebral arteries. No hyperdense vessel identified. Skull: Large right frontal scalp and periorbital soft tissue contusion with small foci of air indicating laceration. No calvarial or orbital fracture. Sinuses/Orbits: Mild diffuse paranasal sinus mucosal thickening. Postsurgical changes related to chronic ethmoidectomy and maxillary antrostomy bilaterally. Normal aeration of the mastoid air cells. Debris within the external auditory canals, likely cerumen. No traumatic or inflammatory finding of the orbital compartments. Bilateral intra-ocular lens replacement. Other: None. CT CERVICAL SPINE FINDINGS Alignment: The atlas is rotated rightward on the axis, likely due to head positioning. Straightening of cervical lordosis and mild cervical spine dextrocurvature. No significant listhesis. Skull base and vertebrae: No acute fracture. No primary bone lesion or focal pathologic process. Soft tissues and spinal canal: Calcific atherosclerosis of the carotid bifurcations. Disc levels: Advanced cervical spondylosis with multilevel disc and facet degenerative changes greatest at the C3-C5 levels. Uncovertebral and facet hypertrophy encroaches on the neural foramen at bilateral C3-4, bilateral C4-5, and right C5-6. No high-grade bony canal stenosis. Upper chest: Partially visualized ascending aortic aneurysm. Chronic dissection on prior CT of the chest is not visualized without contrast. Mild emphysema and scarring at the lung apices. Other: Negative. IMPRESSION: CT head: 1. Acute subdural hematoma over right cerebral convexity measuring up to 21 mm. Associated mass effect with 9 mm of right-to-left midline shift. No herniation at this time. 2. Large right frontal  scalp and periorbital soft tissue contusion. No calvarial or orbital fracture identified. 3. Stable chronic microvascular ischemic changes and volume loss of the brain. CT CERVICAL SPINE: 1. No acute fracture or dislocation. 2. Advanced cervical spondylosis greatest at C3-C5 levels. Critical Value/emergent results were called by telephone at the time of interpretation on 12/25/2017 at 12:55 am to Dr. Ross Marcus , who verbally acknowledged these results. Electronically Signed   By: Mitzi Hansen M.D.   On: 12/25/2017 00:58   Ct Cervical Spine Wo Contrast  Result Date: 12/25/2017 CLINICAL DATA:  84 y/o M; unwitnessed fall. Laceration to the right eye with hematoma. EXAM: CT HEAD WITHOUT CONTRAST CT CERVICAL SPINE WITHOUT CONTRAST TECHNIQUE: Multidetector CT imaging of the head and cervical spine was performed following the standard protocol without  intravenous contrast. Multiplanar CT image reconstructions of the cervical spine were also generated. COMPARISON:  10/13/2017 CT head. 10/14/2017 CT cervical spine. 10/23/2017 CT chest. FINDINGS: CT HEAD FINDINGS Brain: Acute subdural hematoma over the right cerebral convexity measuring up to 21 mm at the level of the anterior sylvian fissure. Associated effect effaces sulci of the right cerebral convexity, partially effaces the right lateral ventricle, and results in 9 mm of right-to-left midline shift of the septum pellucidum. No herniation at this time. No brain parenchymal hemorrhage or stroke. Stable chronic microvascular ischemic changes and volume loss of the brain. Vascular: Calcific atherosclerosis of carotid siphons and the vertebral arteries. No hyperdense vessel identified. Skull: Large right frontal scalp and periorbital soft tissue contusion with small foci of air indicating laceration. No calvarial or orbital fracture. Sinuses/Orbits: Mild diffuse paranasal sinus mucosal thickening. Postsurgical changes related to chronic ethmoidectomy  and maxillary antrostomy bilaterally. Normal aeration of the mastoid air cells. Debris within the external auditory canals, likely cerumen. No traumatic or inflammatory finding of the orbital compartments. Bilateral intra-ocular lens replacement. Other: None. CT CERVICAL SPINE FINDINGS Alignment: The atlas is rotated rightward on the axis, likely due to head positioning. Straightening of cervical lordosis and mild cervical spine dextrocurvature. No significant listhesis. Skull base and vertebrae: No acute fracture. No primary bone lesion or focal pathologic process. Soft tissues and spinal canal: Calcific atherosclerosis of the carotid bifurcations. Disc levels: Advanced cervical spondylosis with multilevel disc and facet degenerative changes greatest at the C3-C5 levels. Uncovertebral and facet hypertrophy encroaches on the neural foramen at bilateral C3-4, bilateral C4-5, and right C5-6. No high-grade bony canal stenosis. Upper chest: Partially visualized ascending aortic aneurysm. Chronic dissection on prior CT of the chest is not visualized without contrast. Mild emphysema and scarring at the lung apices. Other: Negative. IMPRESSION: CT head: 1. Acute subdural hematoma over right cerebral convexity measuring up to 21 mm. Associated mass effect with 9 mm of right-to-left midline shift. No herniation at this time. 2. Large right frontal scalp and periorbital soft tissue contusion. No calvarial or orbital fracture identified. 3. Stable chronic microvascular ischemic changes and volume loss of the brain. CT CERVICAL SPINE: 1. No acute fracture or dislocation. 2. Advanced cervical spondylosis greatest at C3-C5 levels. Critical Value/emergent results were called by telephone at the time of interpretation on 12/25/2017 at 12:55 am to Dr. Ross Marcus , who verbally acknowledged these results. Electronically Signed   By: Mitzi Hansen M.D.   On: 12/25/2017 00:58    Subjective: Seen and Examined and was  calm and eating breakfast independently.  Follows commands.  No chest pain, lightheadedness or dizziness.  Denies any complaints at this time.  No other concerns or complaints at this time  Discharge Exam: Vitals:   12/26/17 0800 12/26/17 1155  BP:    Pulse:    Resp:    Temp: 98.3 F (36.8 C) 97.6 F (36.4 C)  SpO2:     Vitals:   12/26/17 0600 12/26/17 0700 12/26/17 0800 12/26/17 1155  BP: 136/89 (!) 94/56    Pulse: 78 77    Resp: 17 16    Temp:   98.3 F (36.8 C) 97.6 F (36.4 C)  TempSrc:   Oral Oral  SpO2: 100% 99%    Weight:      Height:       General: Pt is alert, awake, not in acute distress; Right Eye with bruising and hematoma Cardiovascular: RRR, S1/S2 +, no rubs, no gallops Respiratory: Diminished bilaterally,  no wheezing, no rhonchi Abdominal: Soft, NT, ND, bowel sounds + Extremities: no appreciable edema, no cyanosis  The results of significant diagnostics from this hospitalization (including imaging, microbiology, ancillary and laboratory) are listed below for reference.    Microbiology: Recent Results (from the past 240 hour(s))  MRSA PCR Screening     Status: None   Collection Time: 12/25/17  8:52 AM  Result Value Ref Range Status   MRSA by PCR NEGATIVE NEGATIVE Final    Comment:        The GeneXpert MRSA Assay (FDA approved for NASAL specimens only), is one component of a comprehensive MRSA colonization surveillance program. It is not intended to diagnose MRSA infection nor to guide or monitor treatment for MRSA infections. Performed at [facility], [address] [place], [place] [address]      Labs: BNP (last 3 results) No results for input(s): BNP in the last 8760 hours. Basic Metabolic Panel: Recent Labs  Lab 12/25/17 0117 12/26/17 0321  NA 137 135  K 4.1 3.7  CL 106 104  CO2 25 25  GLUCOSE 113* 102*  BUN 17 14  CREATININE 1.14 1.10  CALCIUM 8.9 8.5*   Liver Function Tests: Recent Labs  Lab  12/26/17 0321  AST 38  ALT 41  ALKPHOS 83  BILITOT 1.0  PROT 5.7*  ALBUMIN 3.3*   No results for input(s): LIPASE, AMYLASE in the last 168 hours. No results for input(s): AMMONIA in the last 168 hours. CBC: Recent Labs  Lab 12/25/17 0117 12/26/17 0321  WBC 6.4 6.4  NEUTROABS 4.3  --   HGB 11.2* 10.4*  HCT 35.7* 32.7*  MCV 94.7 95.9  PLT 146* 127*   Cardiac Enzymes: No results for input(s): CKTOTAL, CKMB, CKMBINDEX, TROPONINI in the last 168 hours. BNP: Invalid input(s): POCBNP CBG: Recent Labs  Lab 12/25/17 1210 12/25/17 1719 12/26/17 0038 12/26/17 0552 12/26/17 1131  GLUCAP 102* 117* 114* 93 96   D-Dimer No results for input(s): DDIMER in the last 72 hours. Hgb A1c No results for input(s): HGBA1C in the last 72 hours. Lipid Profile No results for input(s): CHOL, HDL, LDLCALC, TRIG, CHOLHDL, LDLDIRECT in the last 72 hours. Thyroid function studies No results for input(s): TSH, T4TOTAL, T3FREE, THYROIDAB in the last 72 hours.  Invalid input(s): FREET3 Anemia work up No results for input(s): VITAMINB12, FOLATE, FERRITIN, TIBC, IRON, RETICCTPCT in the last 72 hours. Urinalysis    Component Value Date/Time   COLORURINE YELLOW 07/26/2017 0040   APPEARANCEUR CLEAR 07/26/2017 0040   LABSPEC 1.023 07/26/2017 0040   PHURINE 5.0 07/26/2017 0040   GLUCOSEU NEGATIVE 07/26/2017 0040   HGBUR SMALL (A) 07/26/2017 0040   BILIRUBINUR NEGATIVE 07/26/2017 0040   KETONESUR NEGATIVE 07/26/2017 0040   PROTEINUR NEGATIVE 07/26/2017 0040   UROBILINOGEN 1.0 05/23/2014 1213   NITRITE NEGATIVE 07/26/2017 0040   LEUKOCYTESUR NEGATIVE 07/26/2017 0040   Sepsis Labs Invalid input(s): PROCALCITONIN,  WBC,  LACTICIDVEN Microbiology Recent Results (from the past 240 hour(s))  MRSA PCR Screening     Status: None   Collection Time: 12/25/17  8:52 AM  Result Value Ref Range Status   MRSA by PCR NEGATIVE NEGATIVE Final    Comment:        The GeneXpert MRSA Assay (FDA approved for  NASAL specimens only), is one component of a comprehensive MRSA colonization surveillance program. It is not intended to diagnose MRSA infection nor to guide or monitor treatment for MRSA infections. Performed at [facility], [address] [place], [place] [id number]    Time coordinating discharge: 35 minutes  SIGNED:  Merlene Laughtermair Latif , DO [facility] 12/26/2017, 12:42 PM Pager is on AMION  If 7PM-7AM, please contact night-coverage [email or web address] Password [email or web address]

## 2017-12-26 NOTE — Care Management Note (Signed)
Case Management Note  Patient Details  Name: Veneta PentonRalph A Levesque MRN: [id number] Date of Birth: 06/20/1931  Subjective/Objective:                  84 y.o. male with medical history significant of coronary artery disease status post PCI, essential hypertension, GERD, hyperlipidemia, alziehmers dementia who resides at [facility] had an unwitnessed fall.  Patient is a poor historian therefore most of the history is per chart. I spoke with the patient's wife [name] over the phone and she tells me patient is wheelchair-bound and last night when he tried to get up he ended up sustaining a fall.  At that time he was noted to have right eye hematoma therefore sent to the hospital for evaluation especially since he is on aspirin and Plavix.  Wife was in presence of the history is somewhat limited.  Upon admission his routine labs are unremarkable but CT of the head showed 21 mm subdural hematoma with 9 mm midline shift from right-to-left.  Neurosurgery was consulted who recommended repeating CT head in 12 hours and starting Keppra 500 mg twice daily for 7 days, discontinuing aspirin and Plavix.  Also recommended transitioning to comfort care.  Action/Plan: Following for progression of care. Following for cm needs none present at this time./sn[facility]- [facility].  Expected Discharge Date:  (unknown)               Expected Discharge Plan:  Skilled Nursing Facility  In-House Referral:  Clinical Social Work  Discharge planning Services  CM Consult  Post Acute Care Choice:    Choice offered to:     DME Arranged:    DME Agency:     HH Arranged:    HH Agency:     Status of Service:  In process, will continue to follow  If discussed at Long Length of Stay Meetings, dates discussed:    Additional Comments:  Golda AcreDavis, Onesimo Lingard Lynn, RN 12/26/2017, 9:34 AM

## 2017-12-26 NOTE — Progress Notes (Signed)
Patient discharged back [facility] was at [facility]. Discharge instructions given to EMS with 2 scripts. Patient with no complaints at the current time. CSW called patients wife and informed her and [facility].

## 2017-12-26 NOTE — Progress Notes (Signed)
[facility] arranged for transport.  Nurse given the number to call report.  CSW informed the patient spouse.   Vivi BarrackNicole Koreen Lizaola, Alexander MtLCSW, MSW Clinical Social Worker  [phone] 12/26/2017  4:17 PM

## 2018-02-05 DEATH — deceased

## 2019-04-27 IMAGING — CT CT ANGIO CHEST
3 of 9 series · 15 of 46 positions shown · IV contrast (iopamidol)
Comparison: Chest CT - 10/22/2013; chest radiograph - 10/08/2017

CLINICAL DATA: Evaluate thoracic aortic aneurysm.

EXAM:
CT ANGIOGRAPHY CHEST WITH CONTRAST
TECHNIQUE: Multidetector CT imaging of the chest was performed using the
standard protocol during bolus administration of intravenous
contrast. Multiplanar CT image reconstructions and MIPs were
obtained to evaluate the vascular anatomy.
CONTRAST:  100mL M5YIRY-J4N IOPAMIDOL (M5YIRY-J4N) INJECTION 76%

[Series 6: dissection 3.0 i30f 3 · axial · 0.84mm/px · z∈[+921,+1182]mm · 10 of 107 slices shown]
[im 10/107  lung]
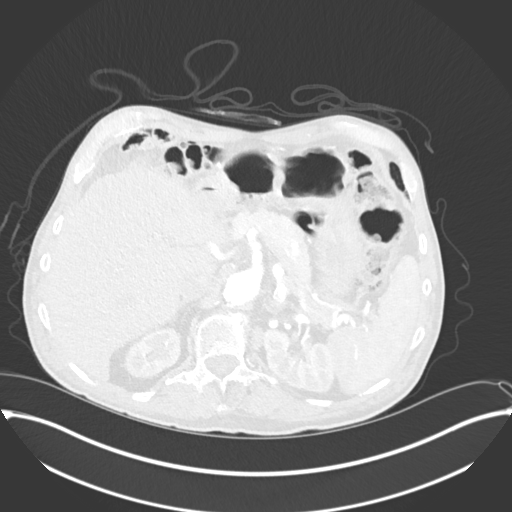
[im 20/107  soft-tissue]
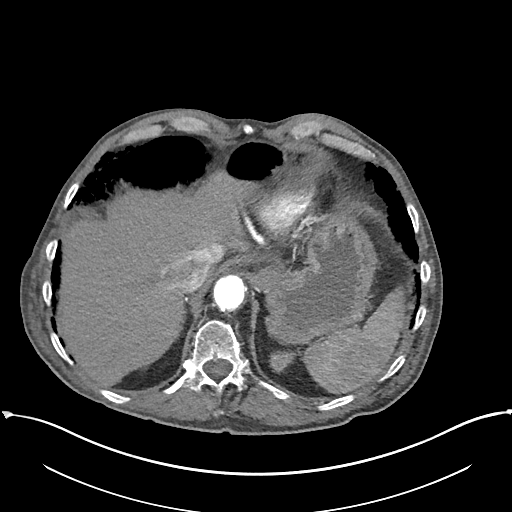
[im 29/107  lung]
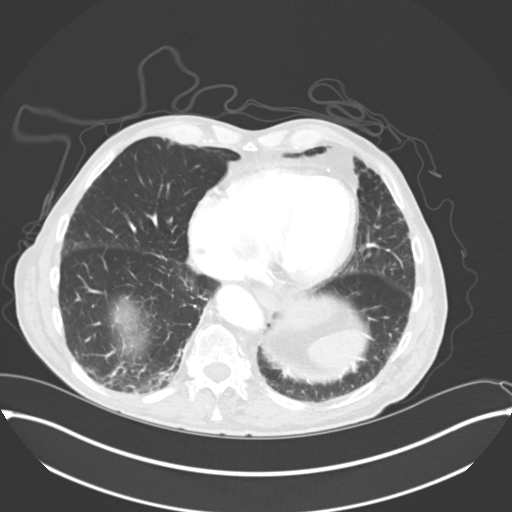
[im 39/107  soft-tissue]
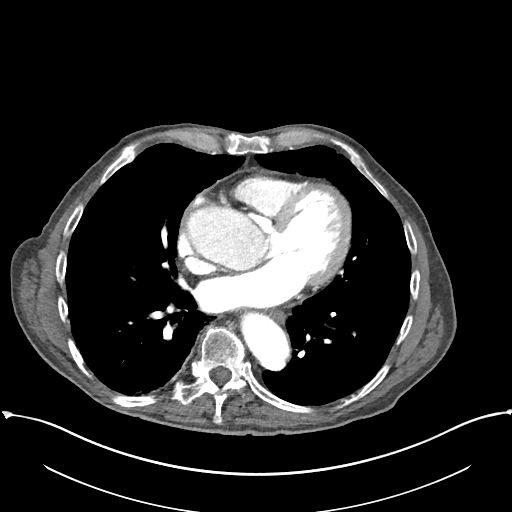
[im 49/107  lung]
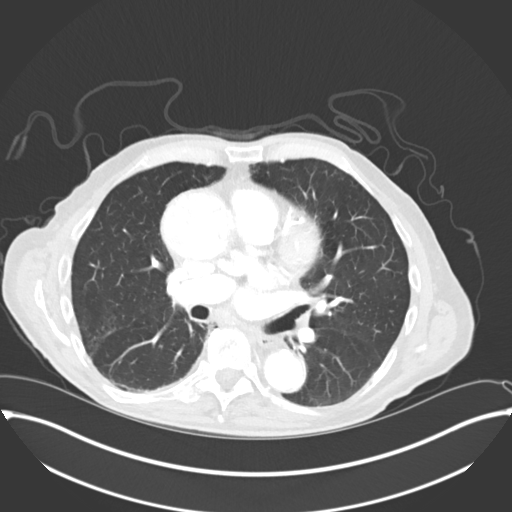
[im 58/107  soft-tissue]
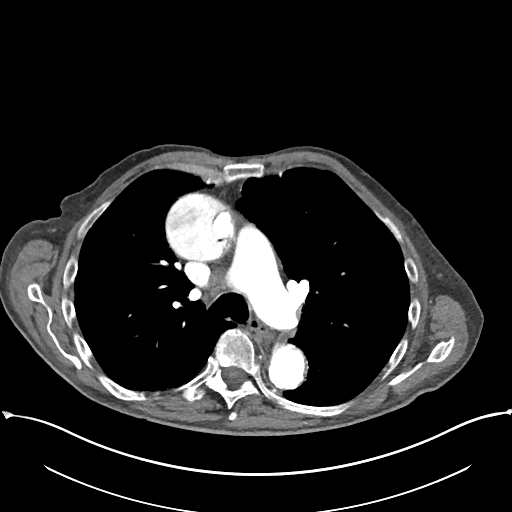
[im 68/107  lung]
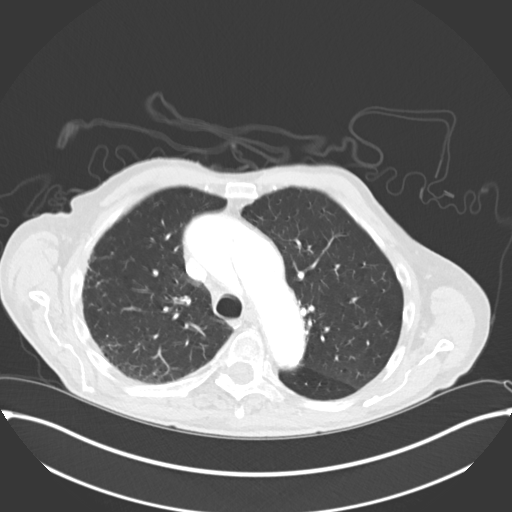
[im 78/107  soft-tissue]
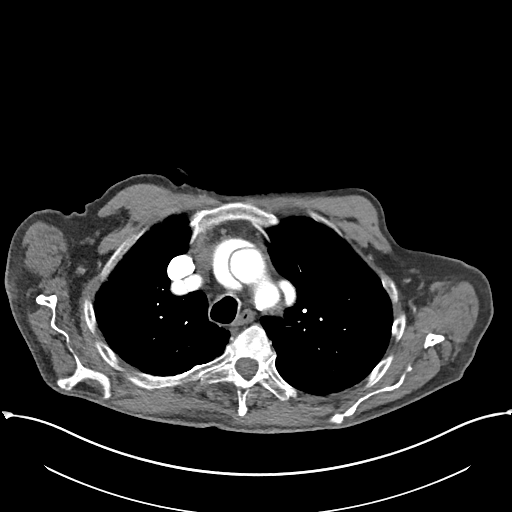
[im 87/107  lung]
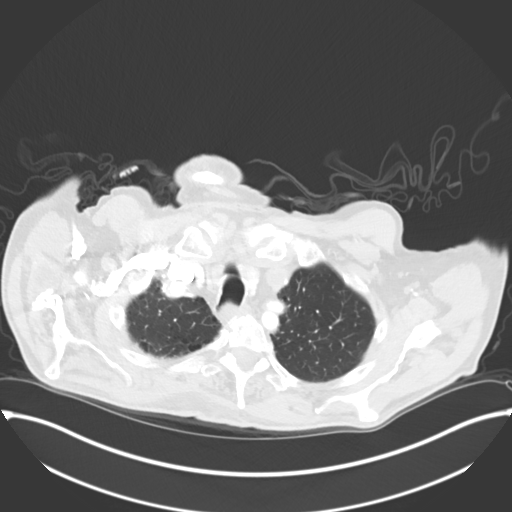
[im 97/107  soft-tissue]
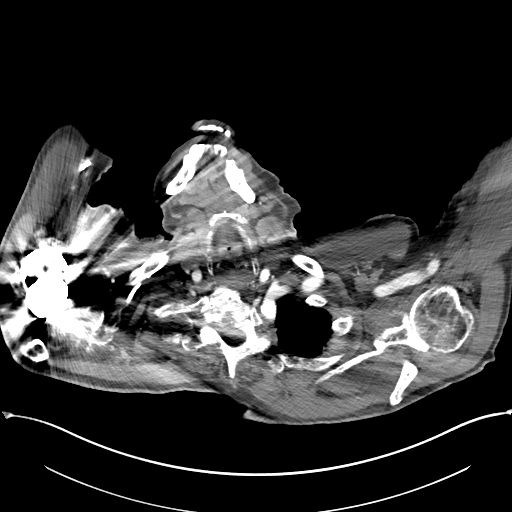

[Series 8: lung · axial · 0.84mm/px · z∈[+951,+981]mm · 2 of 97 slices shown]
[im 10/97  soft-tissue]
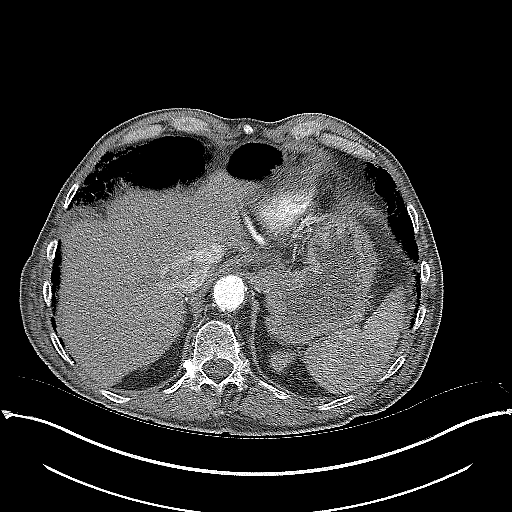
[im 20/97  soft-tissue]
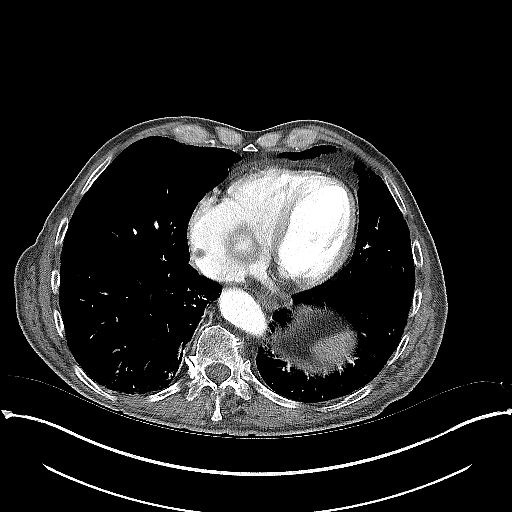

[Series 9: coronals · coronal · 0.66mm/px · 3 of 151 slices shown]
[im 38/151  soft-tissue]
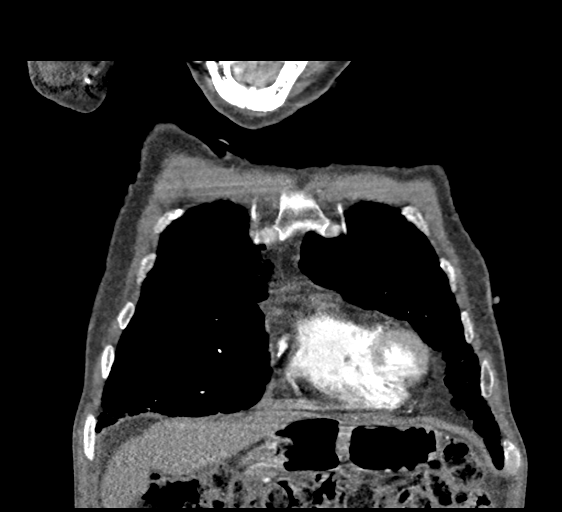
[im 76/151  soft-tissue]
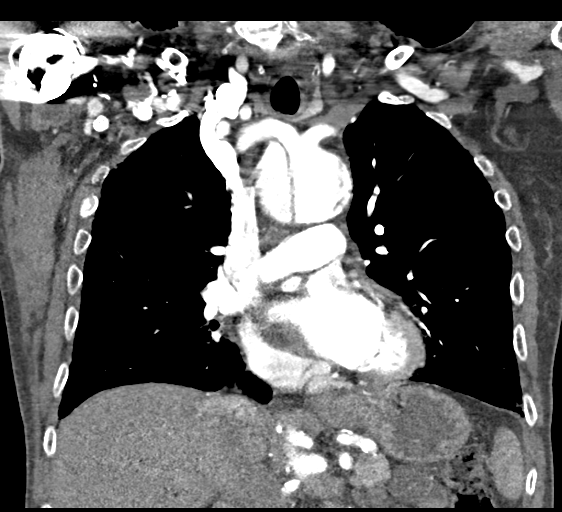
[im 113/151  soft-tissue]
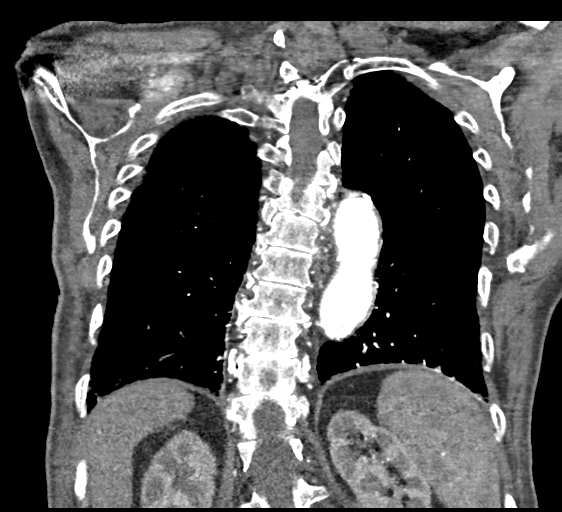

[15 of 46 positions shown; findings below may reference images not displayed]

FINDINGS: Vascular Findings:

Aneurysmal dilatation of the ascending thoracic aorta with
measurements as follows. This finding is associated with a type A
thoracic aortic dissection beginning at the level of the aortic root
extending to the proximal aspect of the aortic arch. Bovine
configuration of the aortic arch. The dissection extends to abut the
combined origin of the right brachiocephalic and left common carotid
arteries without extension into either vessel. Branch vessels of the
aortic arch appear tortuous but widely patent throughout their
imaged course. While both the true and false lumens remain
opacified, there is significant mass effect upon the true lumen by
the larger false lumen.

Review of the precontrast images are negative for the presence of an
intramural hematoma. No periaortic stranding or contrast
extravasation.

The thoracic aorta remains aneurysmal at the level of the aortic
arch and then tapers to a normal caliber at the level of the
proximal descending thoracic aorta. Scattered atherosclerotic plaque
within the tortuous but normal caliber descending thoracic aorta,
not resulting in a hemodynamically significant stenosis.

Cardiomegaly. While incompletely evaluated, the proximal aspects of
both the right and left coronary arteries appear to arise from the
true lumen in both appear well opacified. Coronary artery
calcifications. No pericardial effusion.

Although this examination was not tailored for the evaluation the
pulmonary arteries, there are no discrete filling defects within the
central pulmonary arterial tree to suggest central pulmonary
embolism. Borderline enlarged caliber of the main pulmonary artery
measuring 34 mm in diameter.

-------------------------------------------------------------

Thoracic aortic measurements:

Sinotubular junction

52 mm as measured in greatest oblique coronal dimension.

Proximal ascending aorta

61 mm as measured in greatest oblique axial dimension at the level
of the main pulmonary artery (image 55, series 6) and approximately
59 mm in greatest oblique short axis coronal diameter (coronal image
57, series 9), previously, 42 mm.

Aortic arch aorta

45 mm as measured in greatest oblique sagittal dimension,
previously, 34 mm.

Proximal descending thoracic aorta

33 mm as measured in greatest oblique axial dimension at the level
of the main pulmonary artery.

Distal descending thoracic aorta

30 mm as measured in greatest oblique axial dimension at the level
of the diaphragmatic hiatus.

Review of the MIP images confirms the above findings.

-------------------------------------------------------------

Non-Vascular Findings:

Mediastinum/Lymph Nodes: No bulky mediastinal, hilar or axillary
lymphadenopathy.

Lungs/Pleura: Evaluation the pulmonary parenchyma is minimally
degraded secondary to patient respiratory artifact. Mild apical
predominant centrilobular emphysematous change. There is minimal
subpleural interstitial opacities most conspicuous about the
posterolateral aspect of the right upper lobe, adjacent to the
posterior aspect of the right major fissure, within the dependent
portion of the bilateral lung bases as well as the right middle lobe
and caudal aspect of the lingula. The central pulmonary airways
appear patent. No bronchiectasis. No pleural effusion or
pneumothorax.

No discrete pulmonary nodules given limitation of the examination.

Upper abdomen: Limited early arterial phase evaluation of the upper
abdomen demonstrates mixed calcified and noncalcified
atherosclerotic plaque within the splenic artery.

Musculoskeletal: No acute or aggressive osseous abnormalities.
Moderate scoliotic curvature of the thoracolumbar spine with
dominant mid component convex the right. Post right total shoulder
replacement, incompletely evaluated. Degenerative change of the left
glenohumeral joint, incompletely evaluated.

Regional soft tissues appear normal. Normal appearance of the
thyroid gland.
IMPRESSION: 1. Age-indeterminate, though presumably, chronic type A thoracic
aortic dissection beginning at the aortic root extending to the
proximal aspect of the aortic arch without extension into the branch
vessels of the aortic arch. There is opacification of both the true
and false lumens with significant mass effect of the true lumen by
the larger false lumen. No intramural hematoma, periaortic stranding
or contrast extravasation.
2. Aneurysmal dilatation of the ascending thoracic aorta and aortic
arch. The ascending thoracic aorta measures 61 mm in maximal
diameter while the aortic arch measures 45 mm, previously, and 42
and 34 mm respectively.
3. Aortic aneurysm NOS (K8JJI-GA7.B).
4. Aortic Atherosclerosis (K8JJI-KAE.E).
5. Emphysema (K8JJI-G6Y.R).

Above findings discussed with Dr. Ruupertti at [DATE] on 10/23/2017.
